# Patient Record
Sex: Female | Born: 1985
Health system: Southern US, Community
[De-identification: ages and names within clinical notes are randomized; demographics above are authoritative.]

## PROBLEM LIST (undated history)

## (undated) DIAGNOSIS — B20 Human immunodeficiency virus [HIV] disease: Secondary | ICD-10-CM

## (undated) DIAGNOSIS — E739 Lactose intolerance, unspecified: Secondary | ICD-10-CM

## (undated) DIAGNOSIS — Z21 Asymptomatic human immunodeficiency virus [HIV] infection status: Secondary | ICD-10-CM

## (undated) DIAGNOSIS — R7989 Other specified abnormal findings of blood chemistry: Secondary | ICD-10-CM

## (undated) DIAGNOSIS — Z91018 Allergy to other foods: Secondary | ICD-10-CM

## (undated) DIAGNOSIS — F419 Anxiety disorder, unspecified: Secondary | ICD-10-CM

## (undated) DIAGNOSIS — R011 Cardiac murmur, unspecified: Secondary | ICD-10-CM

## (undated) DIAGNOSIS — R0602 Shortness of breath: Secondary | ICD-10-CM

## (undated) HISTORY — DX: Lactose intolerance, unspecified: E73.9

## (undated) HISTORY — DX: Human immunodeficiency virus (HIV) disease: B20

## (undated) HISTORY — DX: Shortness of breath: R06.02

## (undated) HISTORY — DX: Allergy to other foods: Z91.018

## (undated) HISTORY — DX: Asymptomatic human immunodeficiency virus (hiv) infection status: Z21

## (undated) HISTORY — PX: SKIN GRAFT: SHX250

---
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
## 2003-04-11 ENCOUNTER — Emergency Department (HOSPITAL_COMMUNITY): Admission: EM | Admit: 2003-04-11 | Discharge: 2003-04-11 | Payer: Self-pay | Admitting: *Deleted

## 2003-04-11 ENCOUNTER — Encounter: Payer: Self-pay | Admitting: *Deleted

## 2004-04-22 ENCOUNTER — Emergency Department (HOSPITAL_COMMUNITY): Admission: EM | Admit: 2004-04-22 | Discharge: 2004-04-22 | Payer: Self-pay | Admitting: Emergency Medicine

## 2005-06-18 ENCOUNTER — Emergency Department: Payer: Self-pay | Admitting: Emergency Medicine

## 2006-05-08 ENCOUNTER — Emergency Department (HOSPITAL_COMMUNITY): Admission: EM | Admit: 2006-05-08 | Discharge: 2006-05-08 | Payer: Self-pay | Admitting: Emergency Medicine

## 2006-10-06 ENCOUNTER — Emergency Department: Payer: Self-pay | Admitting: Emergency Medicine

## 2007-10-24 ENCOUNTER — Emergency Department: Payer: Self-pay | Admitting: Emergency Medicine

## 2008-07-08 ENCOUNTER — Emergency Department: Payer: Self-pay | Admitting: Emergency Medicine

## 2008-09-21 ENCOUNTER — Emergency Department: Payer: Self-pay | Admitting: Emergency Medicine

## 2009-05-12 ENCOUNTER — Emergency Department: Payer: Self-pay | Admitting: Unknown Physician Specialty

## 2009-07-20 ENCOUNTER — Encounter: Payer: Self-pay | Admitting: Internal Medicine

## 2009-07-21 ENCOUNTER — Encounter: Payer: Self-pay | Admitting: Infectious Disease

## 2009-07-21 ENCOUNTER — Ambulatory Visit: Payer: Self-pay | Admitting: Internal Medicine

## 2009-07-21 DIAGNOSIS — B20 Human immunodeficiency virus [HIV] disease: Secondary | ICD-10-CM | POA: Insufficient documentation

## 2009-07-21 LAB — CONVERTED CEMR LAB
ALT: 14 units/L (ref 0–53)
Albumin: 4.7 g/dL (ref 3.5–5.2)
Basophils Absolute: 0 10*3/uL (ref 0.0–0.1)
Basophils Relative: 1 % (ref 0–1)
CO2: 26 meq/L (ref 19–32)
Chlamydia, Swab/Urine, PCR: NEGATIVE
Chloride: 103 meq/L (ref 96–112)
Cholesterol: 131 mg/dL (ref 0–200)
HDL: 48 mg/dL (ref >39)
HIV-1 antibody: POSITIVE — AB
HIV: REACTIVE
Hemoglobin, Urine: NEGATIVE
Hep B S Ab: POSITIVE — AB
MCV: 88.2 fL (ref >=78.0)
Neutro Abs: 2.8 10*3/uL (ref 1.7–7.7)
Neutrophils Relative %: 52 % (ref 43–77)
Platelets: 194 10*3/uL (ref 150–400)
Potassium: 4.4 meq/L (ref 3.5–5.3)
Protein, ur: NEGATIVE mg/dL
RDW: 13 % (ref 11.5–15.5)
Sodium: 140 meq/L (ref 135–145)
Total Bilirubin: 0.3 mg/dL (ref 0.3–1.2)
Total CHOL/HDL Ratio: 2.7
Total Protein: 7.8 g/dL (ref 6.0–8.3)
Triglycerides: 74 mg/dL (ref <150)
Urobilinogen, UA: 0.2 (ref 0.0–1.0)
pH: 7.5 (ref 5.0–8.0)

## 2009-08-05 ENCOUNTER — Ambulatory Visit: Payer: Self-pay | Admitting: Internal Medicine

## 2009-09-20 ENCOUNTER — Ambulatory Visit: Payer: Self-pay | Admitting: Internal Medicine

## 2009-09-20 LAB — CONVERTED CEMR LAB
AST: 22 units/L (ref 0–37)
Basophils Absolute: 0 10*3/uL (ref 0.0–0.1)
Creatinine, Ser: 0.99 mg/dL (ref 0.40–1.50)
Eosinophils Relative: 2 % (ref 0–5)
Glucose, Bld: 66 mg/dL — ABNORMAL LOW (ref 70–99)
HIV 1 RNA Quant: 128000 copies/mL — ABNORMAL HIGH (ref <48)
Lymphocytes Relative: 36 % (ref 12–46)
Lymphs Abs: 1.9 10*3/uL (ref 0.7–4.0)
MCHC: 33.5 g/dL (ref 30.0–36.0)
Monocytes Absolute: 0.6 10*3/uL (ref 0.1–1.0)
Neutrophils Relative %: 50 % (ref 43–77)
Platelets: 183 10*3/uL (ref 150–400)
RBC: 4.75 M/uL (ref 4.22–5.81)
RDW: 13.2 % (ref 11.5–15.5)
Total Bilirubin: 0.5 mg/dL (ref 0.3–1.2)
Total Protein: 7.4 g/dL (ref 6.0–8.3)

## 2009-10-14 ENCOUNTER — Ambulatory Visit: Payer: Self-pay | Admitting: Internal Medicine

## 2009-11-28 ENCOUNTER — Ambulatory Visit: Payer: Self-pay | Admitting: Internal Medicine

## 2009-11-28 LAB — CONVERTED CEMR LAB
Albumin: 4.5 g/dL (ref 3.5–5.2)
BUN: 10 mg/dL (ref 6–23)
Basophils Absolute: 0 10*3/uL (ref 0.0–0.1)
CO2: 28 meq/L (ref 19–32)
Chloride: 105 meq/L (ref 96–112)
Creatinine, Ser: 0.97 mg/dL (ref 0.40–1.50)
Lymphocytes Relative: 33 % (ref 12–46)
MCV: 90.4 fL (ref >=78.0)
Monocytes Absolute: 0.5 10*3/uL (ref 0.1–1.0)
Neutro Abs: 4.2 10*3/uL (ref 1.7–7.7)
Neutrophils Relative %: 58 % (ref 43–77)
Platelets: 202 10*3/uL (ref 150–400)
Potassium: 3.7 meq/L (ref 3.5–5.3)
RDW: 14.5 % (ref 11.5–15.5)

## 2009-11-30 ENCOUNTER — Encounter: Payer: Self-pay | Admitting: Internal Medicine

## 2010-01-02 ENCOUNTER — Ambulatory Visit: Payer: Self-pay | Admitting: Internal Medicine

## 2010-01-09 ENCOUNTER — Telehealth (INDEPENDENT_AMBULATORY_CARE_PROVIDER_SITE_OTHER): Payer: Self-pay | Admitting: *Deleted

## 2010-02-03 ENCOUNTER — Encounter (INDEPENDENT_AMBULATORY_CARE_PROVIDER_SITE_OTHER): Payer: Self-pay | Admitting: *Deleted

## 2010-03-21 ENCOUNTER — Ambulatory Visit: Payer: Self-pay | Admitting: Internal Medicine

## 2010-03-21 LAB — CONVERTED CEMR LAB
ALT: 22 units/L (ref 0–53)
Alkaline Phosphatase: 68 units/L (ref 39–117)
Basophils Absolute: 0 10*3/uL (ref 0.0–0.1)
Basophils Relative: 1 % (ref 0–1)
Glucose, Bld: 97 mg/dL (ref 70–99)
HIV-1 RNA Quant, Log: <1.68 (ref <1.68)
MCHC: 33.7 g/dL (ref 30.0–36.0)
Neutro Abs: 3.1 10*3/uL (ref 1.7–7.7)
Neutrophils Relative %: 53 % (ref 43–77)
Platelets: 201 10*3/uL (ref 150–400)
RDW: 12.8 % (ref 11.5–15.5)
Sodium: 139 meq/L (ref 135–145)
Total Bilirubin: 0.2 mg/dL — ABNORMAL LOW (ref 0.3–1.2)
Total Protein: 7 g/dL (ref 6.0–8.3)

## 2010-03-29 ENCOUNTER — Ambulatory Visit: Payer: Self-pay | Admitting: Internal Medicine

## 2010-03-29 DIAGNOSIS — F411 Generalized anxiety disorder: Secondary | ICD-10-CM | POA: Insufficient documentation

## 2010-04-21 ENCOUNTER — Ambulatory Visit: Payer: Self-pay | Admitting: Internal Medicine

## 2010-05-15 ENCOUNTER — Encounter: Payer: Self-pay | Admitting: Internal Medicine

## 2010-07-07 ENCOUNTER — Encounter: Payer: Self-pay | Admitting: Internal Medicine

## 2010-07-10 ENCOUNTER — Ambulatory Visit: Payer: Self-pay | Admitting: Internal Medicine

## 2010-07-10 LAB — CONVERTED CEMR LAB
BUN: 14 mg/dL (ref 6–23)
Basophils Absolute: 0 10*3/uL (ref 0.0–0.1)
CO2: 31 meq/L (ref 19–32)
Creatinine, Ser: 1 mg/dL (ref 0.40–1.50)
Eosinophils Relative: 3 % (ref 0–5)
Glucose, Bld: 88 mg/dL (ref 70–99)
HCT: 40.8 % (ref 39.0–52.0)
HIV 1 RNA Quant: <20 copies/mL (ref <20)
HIV-1 RNA Quant, Log: <1.3 (ref <1.30)
Hemoglobin: 14.1 g/dL (ref 13.0–17.0)
Lymphocytes Relative: 40 % (ref 12–46)
Lymphs Abs: 2.6 10*3/uL (ref 0.7–4.0)
Monocytes Absolute: 0.5 10*3/uL (ref 0.1–1.0)
Monocytes Relative: 8 % (ref 3–12)
RDW: 12.5 % (ref 11.5–15.5)
Total Bilirubin: 0.2 mg/dL — ABNORMAL LOW (ref 0.3–1.2)

## 2010-08-29 ENCOUNTER — Ambulatory Visit: Payer: Self-pay | Admitting: Internal Medicine

## 2010-11-09 NOTE — Assessment & Plan Note (Signed)
Summary: F/U [MKJ]   CC:  follow-up visit, lab results, c/o stress, anxiety, and depression x 2 months, and and excessive thirst.  History of Present Illness: Pt has been feeling depressed and having anxiety attacks because he is currently living in his car and not able to sleep.  He has an application in for shelter plus housing which he is awaiting a response.  He has not missed any doses of his Atripla.  Depression History:      The patient is having a depressed mood most of the day and has a diminished interest in his usual daily activities.        The patient denies that he feels like life is not worth living, denies that he wishes that he were dead, and denies that he has thought about ending his life.        Preventive Screening-Counseling & Management  Alcohol-Tobacco     Alcohol drinks/day: occasional     Smoking Status: quit > 6 months     Year Quit: 06/2009  Caffeine-Diet-Exercise     Caffeine use/day: soda occassionally     Does Patient Exercise: yes     Type of exercise: dancing     Exercise (avg: min/session): >60     Times/week: 7  Safety-Violence-Falls     Seat Belt Use: yes      Sexual History:  n/a.        Drug Use:  No.    Comments: pt. declined condoms   Updated Prior Medication List: ATRIPLA 600-200-300 MG TABS (EFAVIRENZ-EMTRICITAB-TENOFOVIR) Take 1 tablet by mouth at bedtime  Current Allergies (reviewed today): No known allergies  Social History: Sexual History:  n/a  Review of Systems  The patient denies anorexia, fever, and weight loss.    Vital Signs:  Patient profile:   25 year old female Height:      75 inches (190.50 cm) Weight:      153.8 pounds (69.91 kg) BMI:     19.29 Temp:     98.3 degrees F (36.83 degrees C) oral Pulse rate:   82 / minute BP sitting:   111 / 74  (left arm)  Vitals Entered By: Wendall Mola CMA Duncan Dull) (March 29, 2010 2:50 PM) CC: follow-up visit, lab results, c/o stress, anxiety, and depression x  2 months, and excessive thirst Is Patient Diabetic? No Pain Assessment Patient in pain? no      Nutritional Status BMI of 19 -24 = normal Nutritional Status Detail appetite "off and on"  Have you ever been in a relationship where you felt threatened, hurt or afraid?No   Does patient need assistance? Functional Status Self care Ambulation Normal Comments no missed doses of meds per patient patient is presently homeless   Physical Exam  General:  alert, well-developed, well-nourished, and well-hydrated.   Head:  normocephalic and atraumatic.   Mouth:  pharynx pink and moist.   Lungs:  normal breath sounds.      Impression & Recommendations:  Problem # 1:  HIV INFECTION (ICD-042) Pt.s most recent CD4ct was 680 and VL  <48.  Pt instructed to continue the current antiretroviral regimen.  Pt encouraged to take medication regularly and not miss doeses.  Pt will f/u in 3 months for repeat blood work and will see me 2 weeks later.  Diagnostics Reviewed:  HIV: HIV positive - not AIDS (01/02/2010)   HIV-Western blot: Positive (07/21/2009)   CD4: 680 (03/22/2010)   WBC: 5.8 (03/21/2010)   Hgb:  14.3 (03/21/2010)   HCT: 42.4 (03/21/2010)   Platelets: 201 (03/21/2010) HIV genotype: * (07/21/2009)   HIV-1 RNA: <48 copies/mL (03/21/2010)   HBSAg: NEG (07/21/2009)  Orders: Est. Patient Level III (99213)Future Orders: T-CD4SP ([facility]) (CD4SP) ... 06/27/2010 T-HIV Viral Load [id number]) ... 06/27/2010 T-Comprehensive Metabolic Panel [id number]) ... 06/27/2010 T-CBC w/Diff ([id number]) ... 06/27/2010  Problem # 2:  ANXIETY STATE, UNSPECIFIED (ICD-300.00) refer to counseling.  Patient Instructions: 1)  Please schedule a follow-up appointment in 3 months, 2 weeks after labs.

## 2010-11-09 NOTE — Miscellaneous (Signed)
Summary: [facility]; Medical Case Mgt.   [facility]; Medical Case Mgt[facility]   Imported By: Florinda Marker 11/02/2009 14:25:44  _____________________________________________________________________  External Attachment:    Type:   Image     Comment:   External Document

## 2010-11-09 NOTE — Assessment & Plan Note (Signed)
Summary: FLU SHOT  Prior Medications: ATRIPLA 600-200-300 MG TABS (EFAVIRENZ-EMTRICITAB-TENOFOVIR) Take 1 tablet by mouth at bedtime ALPRAZOLAM 1 MG TABS (ALPRAZOLAM) Take 1 tablet by mouth every 8 hours as needed Current Allergies: No known allergies  Immunizations Administered:  Influenza Vaccine # 1:    Vaccine Type: Fluvax Non-MCR    Site: right deltoid    Mfr: novartis    Dose: 0.5 ml    Route: IM    Given by: Deirdre Evener RN    Exp. Date: 01/21/2011    Lot #: [id number]    VIS given: 05/02/10 version given July 10, 2010.  Flu Vaccine Consent Questions:    Do you have a history of severe allergic reactions to this vaccine? no    Any prior history of allergic reactions to egg and/or gelatin? no    Do you have a sensitivity to the preservative Thimersol? no    Do you have a past history of Guillan-Barre Syndrome? no    Do you currently have an acute febrile illness? no    Have you ever had a severe reaction to latex? no    Vaccine information given and explained to patient? yes  Orders Added: 1)  Influenza Vaccine NON MCR [00028]

## 2010-11-09 NOTE — Letter (Signed)
Summary: Income Veritication  Income Veritication   Imported By: Florinda Marker 12/08/2009 15:40:19  _____________________________________________________________________  External Attachment:    Type:   Image     Comment:   External Document

## 2010-11-09 NOTE — Assessment & Plan Note (Signed)
Summary: MEDS PROBLEM [MKJ]   CC:  pt. c/o anxiety for three months with "panic attacks" and Depression.  History of Present Illness: Pt has been experiencing a lot of anxiety and panic attacks. Pt feels anxious when in stores or in cars when someone else is driving. pt is in counseling with Alisa.  Pt would like something to take for anxiety.  Depression History:      The patient denies a depressed mood most of the day and a diminished interest in his usual daily activities.        The patient denies that he feels like life is not worth living, denies that he wishes that he were dead, and denies that he has thought about ending his life.        Preventive Screening-Counseling & Management  Alcohol-Tobacco     Alcohol drinks/day: occasional     Smoking Status: quit > 6 months     Year Quit: 06/2009  Caffeine-Diet-Exercise     Caffeine use/day: soda occassionally     Does Patient Exercise: yes     Type of exercise: dancing     Exercise (avg: min/session): >60     Times/week: 7  Safety-Violence-Falls     Seat Belt Use: yes      Sexual History:  n/a.        Drug Use:  No.     Updated Prior Medication List: ATRIPLA 600-200-300 MG TABS (EFAVIRENZ-EMTRICITAB-TENOFOVIR) Take 1 tablet by mouth at bedtime ALPRAZOLAM 1 MG TABS (ALPRAZOLAM) Take 1 tablet by mouth every 8 hours as needed  Current Allergies (reviewed today): No known allergies  Review of Systems  The patient denies anorexia, fever, weight loss, and chest pain.    Vital Signs:  Patient profile:   25 year old female Height:      75 inches (190.50 cm) Weight:      154.0 pounds (70 kg) BMI:     19.32 Temp:     97.9 degrees F (36.61 degrees C) oral Pulse rate:   59 / minute BP sitting:   116 / 74  (right arm)  Vitals Entered By: Wendall Mola CMA Duncan Dull) (April 21, 2010 9:09 AM) CC: pt. c/o anxiety for three months with "panic attacks", Depression Is Patient Diabetic? No Pain Assessment Patient in pain? no       Nutritional Status BMI of 19 -24 = normal Nutritional Status Detail appetite "so-so"  Does patient need assistance? Functional Status Self care Ambulation Normal Comments no missed doses of meds per patient   Physical Exam  General:  alert, well-developed, well-nourished, and well-hydrated.   Head:  normocephalic and atraumatic.   Mouth:  pharynx pink and moist.   Heart:  normal rate.     Impression & Recommendations:  Problem # 1:  ANXIETY STATE, UNSPECIFIED (ICD-300.00) continue counseling/ deep breathing techniques trial of as needed xanax His updated medication list for this problem includes:    Alprazolam 1 Mg Tabs (Alprazolam) .Marland Kitchen... Take 1 tablet by mouth every 8 hours as needed  Orders: Est. Patient Level III (40981)  Medications Added to Medication List This Visit: 1)  Alprazolam 1 Mg Tabs (Alprazolam) .... Take 1 tablet by mouth every 8 hours as needed Prescriptions: ALPRAZOLAM 1 MG TABS (ALPRAZOLAM) Take 1 tablet by mouth every 8 hours as needed  #60 x 0   Entered and Authorized by:   Yisroel Ramming MD   Signed by:   Yisroel Ramming MD on 04/21/2010   Method used:  Print then Give to Patient   RxID:   [id number]

## 2010-11-09 NOTE — Progress Notes (Signed)
Summary: Need additonal info for ADAP    Call placed by: Paulo Fruit  BS,CPht II,MPH,  January 09, 2010 2:59 PM Call placed to: Patient Reason for Call: Get patient information Summary of Call: Need additonal information for ADAP approval. Tried to contact patient,but unable to leave a message because the voicemailbox has not been set up as of yet.  Will try again so she will not have any interruptions in her medications. Initial call taken by: Paulo Fruit  BS,CPht II,MPH,  January 09, 2010 3:00 PM  Follow-up for Phone Call        Judeth Cornfield from [facility] [place] office called to let me know that she gave [name] the new phone number to clinic and that Byrd Hesselbach needed to speak with her.  Patient also called and left 2 phone numbers that she may be reached at  [phone] or [phone]    Additional Follow-up for Phone Call Additional follow up Details #2::    I called and had to leave another message for patient on her VM. The number I was able to leave a message on was [phone].  Awaiting patient to return call. Follow-up by: Paulo Fruit  BS,CPht II,MPH,  January 12, 2010 2:12 PM  Additional Follow-up for Phone Call Additional follow up Details #3:: Details for Additional Follow-up Action Taken: Patient came today with the notarized letter of support.  I will take care of resubmitting her application Friday so she can continue receiving medications. Additional Follow-up by: Paulo Fruit  BS,CPht II,MPH,  January 12, 2010 4:56 PM   Appended Document: Need additonal info for ADAP patient came in today because she is out-of-medication. A sample was given of Atripla Sample Given, Lot #: [id number] Expiration Date:05 2013 Patient has been instructed regarding the correct time, dose and frequency of taking this med, including desired effects and most common side effects.

## 2010-11-09 NOTE — Assessment & Plan Note (Signed)
Summary: F/U OV/VS   CC:  follow-up visit, lab results, refill Alprazolam, c/o problems with wisdom teeth, and and blurred vision right eye.  History of Present Illness: patient is here for follow-up on his lab results.  He continues to have good and bad days but he feels like the Xanax is really helping with his anxiety.  He is not missed any doses of his medication.  Preventive Screening-Counseling & Management  Alcohol-Tobacco     Alcohol drinks/day: occasional     Smoking Status: quit > 6 months     Year Quit: 06/2009  Caffeine-Diet-Exercise     Caffeine use/day: soda occassionally     Does Patient Exercise: yes     Type of exercise: dancing     Exercise (avg: min/session): >60     Times/week: 7  Safety-Violence-Falls     Seat Belt Use: yes      Sexual History:  n/a.        Drug Use:  No.    Comments: pt. given condoms   Updated Prior Medication List: ATRIPLA 600-200-300 MG TABS (EFAVIRENZ-EMTRICITAB-TENOFOVIR) Take 1 tablet by mouth at bedtime ALPRAZOLAM 1 MG TABS (ALPRAZOLAM) Take 1 tablet by mouth every 8 hours as needed  Current Allergies (reviewed today): No known allergies  Review of Systems  The patient denies anorexia, fever, and weight loss.    Vital Signs:  Patient profile:   25 year old female Height:      75 inches (190.50 cm) Weight:      151.0 pounds (68.64 kg) BMI:     18.94 Temp:     98.2 degrees F (36.78 degrees C) oral Pulse rate:   61 / minute BP sitting:   105 / 68  (left arm)  Vitals Entered By: Wendall Mola CMA Duncan Dull) (August 29, 2010 3:20 PM) CC: follow-up visit, lab results, refill Alprazolam, c/o problems with wisdom teeth, and blurred vision right eye Is Patient Diabetic? No Pain Assessment Patient in pain? no      Nutritional Status BMI of < 19 = underweight Nutritional Status Detail appetite "varies"  Have you ever been in a relationship where you felt threatened, hurt or afraid?No   Does patient need  assistance? Functional Status Self care Ambulation Normal Comments no missed doses of Atripla per pt.   Physical Exam  General:  alert, well-developed, well-nourished, and well-hydrated.   Head:  normocephalic and atraumatic.   Mouth:  pharynx pink and moist.   Lungs:  normal breath sounds.     Impression & Recommendations:  Problem # 1:  HIV INFECTION (ICD-042) Pt.s most recent CD4ct was 960 and VL <20 .  Pt instructed to continue the current antiretroviral regimen.  Pt encouraged to take medication regularly and not miss doses.  Pt will f/u in 3 months for repeat blood work and will see me 2 weeks later.  Diagnostics Reviewed:  HIV: HIV positive - not AIDS (01/02/2010)   HIV-Western blot: Positive (07/21/2009)   CD4: 960 (07/11/2010)   WBC: 6.5 (07/10/2010)   Hgb: 14.1 (07/10/2010)   HCT: 40.8 (07/10/2010)   Platelets: 224 (07/10/2010) HIV genotype: * (07/21/2009)   HIV-1 RNA: <20 copies/mL (07/10/2010)   HBSAg: NEG (07/21/2009)  Orders: Est. Patient Level III (99213)Future Orders: T-CD4SP ([facility]) (CD4SP) ... 11/27/2010 T-HIV Viral Load [id number]) ... 11/27/2010 T-Comprehensive Metabolic Panel [phone]) ... 11/27/2010 T-CBC w/Diff ([id number]) ... 11/27/2010 T-RPR (Syphilis) [id number]) ... 11/27/2010 T-Lipid Profile [id number]) ... 11/27/2010  Problem # 2:  ANXIETY STATE, UNSPECIFIED (ICD-300.00)  refill xanax His updated medication list for this problem includes:    Alprazolam 1 Mg Tabs (Alprazolam) .Marland Kitchen... Take 1 tablet by mouth every 8 hours as needed  Other Orders: TB Skin Test (16109) Admin 1st Vaccine (60454)  Patient Instructions: 1)  Please schedule a follow-up appointment in 3 months, 2 weeks after labs.  Prescriptions: ALPRAZOLAM 1 MG TABS (ALPRAZOLAM) Take 1 tablet by mouth every 8 hours as needed  #60 x 3   Entered and Authorized by:   Yisroel Ramming MD   Signed by:   Yisroel Ramming MD on 08/29/2010   Method used:   Print then Give to Patient    RxID:   [id number]      Immunizations Administered:  PPD Skin Test:    Vaccine Type: PPD    Site: left forearm    Mfr: Sanofi Pasteur    Dose: 0.1 ml    Route: ID    Given by: Wendall Mola CMA ( AAMA)    Exp. Date: 08/10/2011    Lot #: [id number]

## 2010-11-09 NOTE — Consult Note (Signed)
Summary: New Pt. Referral: [facility]  New Pt. Referral: [facility]   Imported By: Florinda Marker 05/15/2010 14:58:17  _____________________________________________________________________  External Attachment:    Type:   Image     Comment:   External Document

## 2010-11-09 NOTE — Miscellaneous (Signed)
Summary: clinical update/ryan white [facility] apprv til 01/06/11  Clinical Lists Changes  Observations: Added new observation of AIDSDAP: Yes 2011 (02/03/2010 11:56)     Paulo Fruit  BS,CPht II,MPH  February 03, 2010 11:57 AM

## 2010-11-09 NOTE — Assessment & Plan Note (Signed)
Summary: MISSED APPT 10-12-09 FOR F/U VISIT/CH   CC:  f/u labs and nausea in the morning.  History of Present Illness: Pt under a lot of stress. Having some financial issues and legal issues.  Just started her Atripla and has not missed any doses.  Labs were drawn prior to starting.  Preventive Screening-Counseling & Management  Alcohol-Tobacco     Alcohol drinks/day: 0     Smoking Status: never     Year Quit: 06/2009  Caffeine-Diet-Exercise     Caffeine use/day: soda occassionally     Does Patient Exercise: yes     Type of exercise: dancing     Exercise (avg: min/session): >60     Times/week: 7   Updated Prior Medication List: ATRIPLA 600-200-300 MG TABS (EFAVIRENZ-EMTRICITAB-TENOFOVIR) Take 1 tablet by mouth at bedtime  Current Allergies (reviewed today): No known allergies  Review of Systems  The patient denies anorexia, fever, and weight loss.    Vital Signs:  Patient profile:   25 year old female Height:      75 inches (190.50 cm) Weight:      150.31 pounds (68.32 kg) BMI:     18.86 Temp:     97.0 degrees F (36.11 degrees C) oral Pulse rate:   61 / minute BP sitting:   122 / 84  (left arm)  Vitals Entered By: Starleen Arms CMA (October 14, 2009 4:10 PM) CC: f/u labs, nausea in the morning Pain Assessment Patient in pain? no      Nutritional Status BMI of < 19 = underweight Nutritional Status Detail nl  Does patient need assistance? Functional Status Self care Ambulation Normal   Physical Exam  General:  alert, well-developed, well-nourished, and well-hydrated.   Head:  normocephalic and atraumatic.   Mouth:  pharynx pink and moist.  no thrush  Lungs:  normal breath sounds.      Impression & Recommendations:  Problem # 1:  HIV INFECTION (ICD-042) Will bring her back in 6 weeks for labs.  She will see me 2 weeks after. Pt to continue Atripla and try not to miss any doses. Diagnostics Reviewed:  HIV: REACTIVE (07/21/2009)   HIV-Western blot:  Positive (07/21/2009)   CD4: 440 (09/21/2009)   WBC: 5.1 (09/20/2009)   Hgb: 13.6 (09/20/2009)   HCT: 40.6 (09/20/2009)   Platelets: 183 (09/20/2009) HIV genotype: * (07/21/2009)   HIV-1 RNA: 128000 (09/20/2009)   HBSAg: NEG (07/21/2009)  Orders: Est. Patient Level III (99213)Future Orders: T-CD4SP ([facility]) (CD4SP) ... 11/25/2009 T-HIV Viral Load [id number]) ... 11/25/2009 T-Comprehensive Metabolic Panel [phone]) ... 11/25/2009 T-CBC w/Diff ([id number]) ... 11/25/2009  Patient Instructions: 1)  Please schedule a follow-up appointment in 8 weeks.  Process Orders Check Orders Results:     [facility]: ABN not required for this insurance Tests Sent for requisitioning (October 14, 2009 4:22 PM):     11/25/2009: [facility] -- T-HIV Viral Load [phone] (signed)     11/25/2009: [facility] -- T-Comprehensive Metabolic Panel [[id number]] (signed)     11/25/2009: [facility] -- [facility] w/Diff [[id number]] (signed)

## 2010-11-09 NOTE — Miscellaneous (Signed)
Summary: [place]   [facility]   Imported By: Florinda Marker 07/10/2010 15:48:03  _____________________________________________________________________  External Attachment:    Type:   Image     Comment:   External Document

## 2010-11-09 NOTE — Assessment & Plan Note (Signed)
Summary: EST-CK/FU/MEDS/CFB   CC:  follow-up visit and lab results.  History of Present Illness: Pt tolerating the Atripla well.  No missed doses.  Preventive Screening-Counseling & Management  Alcohol-Tobacco     Alcohol drinks/day: 0     Smoking Status: quit > 6 months     Year Quit: 06/2009  Caffeine-Diet-Exercise     Caffeine use/day: soda occassionally     Does Patient Exercise: yes     Type of exercise: dancing     Exercise (avg: min/session): >60     Times/week: 7  Comments: pt given condoms   Updated Prior Medication List: ATRIPLA 600-200-300 MG TABS (EFAVIRENZ-EMTRICITAB-TENOFOVIR) Take 1 tablet by mouth at bedtime  Current Allergies (reviewed today): No known allergies  Review of Systems  The patient denies anorexia, fever, and weight loss.    Vital Signs:  Patient profile:   25 year old female Height:      75 inches (190.50 cm) Weight:      158.7 pounds (72.14 kg) BMI:     19.91 Temp:     97.7 degrees F (36.50 degrees C) oral Pulse rate:   64 / minute BP sitting:   113 / 67  (left arm)  Vitals Entered By: Wendall Mola CMA Duncan Dull) (January 02, 2010 4:05 PM) CC: follow-up visit, lab results Is Patient Diabetic? No Pain Assessment Patient in pain? no      Nutritional Status BMI of 19 -24 = normal Nutritional Status Detail appetite "so-so"  Does patient need assistance? Functional Status Self care Ambulation Normal Comments no missed doses of meds per patient   Physical Exam  General:  alert, well-developed, well-nourished, and well-hydrated.   Head:  normocephalic and atraumatic.   Mouth:  pharynx pink and moist.  no thrush  Lungs:  normal breath sounds.      Impression & Recommendations:  Problem # 1:  HIV INFECTION (ICD-042) Pt.s most recent CD4ct was 720 and VL 249 .  Pt instructed to continue the current antiretroviral regimen.  Pt encouraged to take medication regularly and not miss doses.  Pt will f/u in 3 months for repeat  blood work and will see me 2 weeks later.  Diagnostics Reviewed:  HIV: REACTIVE (07/21/2009)   HIV-Western blot: Positive (07/21/2009)   CD4: 720 (11/29/2009)   WBC: 7.2 (11/28/2009)   Hgb: 13.7 (11/28/2009)   HCT: 41.3 (11/28/2009)   Platelets: 202 (11/28/2009) HIV genotype: * (07/21/2009)   HIV-1 RNA: 249 (11/28/2009)   HBSAg: NEG (07/21/2009)  Orders: Est. Patient Level III (99213)Future Orders: T-CD4SP ([facility]) (CD4SP) ... 04/02/2010 T-HIV Viral Load [id number]) ... 04/02/2010 T-Comprehensive Metabolic Panel [phone] [id number]) ... 04/02/2010 T-CBC w/Diff ([id number]) ... 04/02/2010  Patient Instructions: 1)  Please schedule a follow-up appointment in 3 months, 2 weeks after labs.  Process Orders Check Orders Results:     [facility]: ABN not required for this insurance Tests Sent for requisitioning (January 05, 2010 11:03 AM):     04/02/2010: [facility] -- T-HIV Viral Load [phone] (signed)     04/02/2010: [facility] -- T-Comprehensive Metabolic Panel [[id number]] (signed)     04/02/2010: [facility] -- [facility] w/Diff [[id number]] (signed)

## 2010-11-21 ENCOUNTER — Other Ambulatory Visit: Payer: Self-pay | Admitting: Adult Health

## 2010-11-21 ENCOUNTER — Encounter: Payer: Self-pay | Admitting: Adult Health

## 2010-11-21 ENCOUNTER — Other Ambulatory Visit (INDEPENDENT_AMBULATORY_CARE_PROVIDER_SITE_OTHER): Payer: Self-pay

## 2010-11-21 DIAGNOSIS — B20 Human immunodeficiency virus [HIV] disease: Secondary | ICD-10-CM

## 2010-11-21 LAB — CONVERTED CEMR LAB
ALT: 17 units/L (ref 0–53)
Alkaline Phosphatase: 63 units/L (ref 39–117)
Basophils Relative: 1 % (ref 0–1)
Cholesterol: 124 mg/dL (ref 0–200)
Eosinophils Absolute: 0.1 10*3/uL (ref 0.0–0.7)
Eosinophils Relative: 2 % (ref 0–5)
HCT: 43.1 % (ref 39.0–52.0)
HIV 1 RNA Quant: 57 copies/mL — ABNORMAL HIGH (ref <20)
HIV-1 RNA Quant, Log: 1.76 — ABNORMAL HIGH (ref <1.30)
LDL Cholesterol: 69 mg/dL (ref 0–99)
MCHC: 32.9 g/dL (ref 30.0–36.0)
MCV: 94.7 fL (ref 78.0–100.0)
Monocytes Relative: 7 % (ref 3–12)
Neutrophils Relative %: 57 % (ref 43–77)
Platelets: 231 10*3/uL (ref 150–400)
Sodium: 141 meq/L (ref 135–145)
Total Bilirubin: 0.3 mg/dL (ref 0.3–1.2)
Total Protein: 7.1 g/dL (ref 6.0–8.3)
Triglycerides: 40 mg/dL (ref <150)

## 2010-11-22 LAB — T-HELPER CELL (CD4) - (RCID CLINIC ONLY)
CD4 % Helper T Cell: 40 % (ref 33–55)
CD4 T Cell Abs: 730 uL (ref 400–2700)

## 2010-11-27 ENCOUNTER — Encounter (INDEPENDENT_AMBULATORY_CARE_PROVIDER_SITE_OTHER): Payer: Self-pay | Admitting: *Deleted

## 2010-12-05 ENCOUNTER — Ambulatory Visit (INDEPENDENT_AMBULATORY_CARE_PROVIDER_SITE_OTHER): Payer: Self-pay | Admitting: Adult Health

## 2010-12-05 ENCOUNTER — Encounter: Payer: Self-pay | Admitting: Adult Health

## 2010-12-05 DIAGNOSIS — B20 Human immunodeficiency virus [HIV] disease: Secondary | ICD-10-CM

## 2010-12-05 DIAGNOSIS — F640 Transsexualism: Secondary | ICD-10-CM

## 2010-12-11 ENCOUNTER — Encounter (INDEPENDENT_AMBULATORY_CARE_PROVIDER_SITE_OTHER): Payer: Self-pay | Admitting: *Deleted

## 2010-12-14 ENCOUNTER — Encounter (INDEPENDENT_AMBULATORY_CARE_PROVIDER_SITE_OTHER): Payer: Self-pay | Admitting: *Deleted

## 2010-12-14 NOTE — Assessment & Plan Note (Signed)
Summary: 84month f/u new to np   Vital Signs:  Patient profile:   25 year old female Height:      75 inches Weight:      153.4 pounds BMI:     19.24 Temp:     98.2 degrees F oral Pulse rate:   67 / minute BP sitting:   116 / 79  (left arm)  Vitals Entered By: Alesia Morin CMA (December 05, 2010 10:39 AM) CC: follow-up visit for labs Is Patient Diabetic? No Pain Assessment Patient in pain? no      Nutritional Status BMI of 19 -24 = normal Nutritional Status Detail appetite "good"  Have you ever been in a relationship where you felt threatened, hurt or afraid?No   Does patient need assistance? Functional Status Self care Ambulation Normal Comments no missed doses   CC:  follow-up visit for labs.  History of Present Illness: Doing well. Recently recovered from URI.  No physical complaints at present.  Would like to start estrogen hormone therapy.  Preventive Screening-Counseling & Management  Alcohol-Tobacco     Alcohol drinks/day: occasional     Smoking Status: quit > 6 months     Year Quit: 06/2009  Caffeine-Diet-Exercise     Caffeine use/day: soda occassionally     Does Patient Exercise: yes     Type of exercise: dancing     Exercise (avg: min/session): >60     Times/week: 7  Safety-Violence-Falls     Seat Belt Use: yes      Sexual History:  n/a.        Drug Use:  No.        Blood Transfusions:  no.        Travel History:  no.    Comments: pt accepted condoms  Allergies (verified): No Known Drug Allergies  Social History: Blood Transfusions:  no Travel History:  no  Review of Systems  The patient denies anorexia, fever, weight loss, weight gain, vision loss, decreased hearing, hoarseness, chest pain, syncope, dyspnea on exertion, peripheral edema, prolonged cough, headaches, hemoptysis, abdominal pain, melena, hematochezia, severe indigestion/heartburn, hematuria, incontinence, genital sores, muscle weakness, suspicious skin lesions, transient  blindness, difficulty walking, depression, unusual weight change, abnormal bleeding, enlarged lymph nodes, angioedema, breast masses, and testicular masses.    Physical Exam  General:  alert, well-developed, well-nourished, and well-hydrated.   Head:  normocephalic and atraumatic.   Eyes:  vision grossly intact, pupils equal, pupils round, and pupils reactive to light.   Ears:  R ear normal and L ear normal.   Nose:  no external deformity and no nasal discharge.   Mouth:  pharynx pink and moist.   Neck:  supple, full ROM, and no masses.   Lungs:  normal respiratory effort and normal breath sounds.   Heart:  normal rate, regular rhythm, no murmur, no gallop, and no rub.   Abdomen:  soft, non-tender, normal bowel sounds, no hepatomegaly, and no splenomegaly.   Msk:  normal ROM, no joint tenderness, and no joint swelling.   Extremities:  No clubbing, cyanosis, edema, or deformity noted with normal full range of motion of all joints.   Neurologic:  alert & oriented X3, cranial nerves II-XII intact, strength normal in all extremities, and gait normal.   Skin:  Intact without suspicious lesions or rashes Cervical Nodes:  No lymphadenopathy noted Axillary Nodes:  No palpable lymphadenopathy Inguinal Nodes:  No significant adenopathy Psych:  Cognition and judgment appear intact. Alert and cooperative with normal attention  span and concentration. No apparent delusions, illusions, hallucinations   Impression & Recommendations:  Problem # 1:  HIV INFECTION (ICD-042)  CD4 730 @ 40% with VL of 57 copies.  Small elevatiion in VL may be due to recent URI.  Will re-evaluate in 10 weeks with f/u in 3 months.  CPM.  Orders: Est. Patient Level IV (99214)Future Orders: T-CBC w/Diff ([id number]) ... 02/13/2011 T-CD4SP ([facility]) (CD4SP) ... 02/13/2011 T-Comprehensive Metabolic Panel [id number]) ... 02/13/2011 T-HIV Viral Load [id number]) ... 02/13/2011  Problem # 2:  GENDER IDENTITY DISORDER -  GENTICALLY FEMALE (ICD-302.85)  Requestiong estrogen replacement therapy.  No hx of smoking, breast or prostate cancer, or dyslipidemias.  Will start with low-dose premarin and titrate upward to satifactory response.  Rx of Premarin 0.3mg  by mouth once daily written.  Drug effects, SE's, ADR's and toxicities reviewed with verbalized understanding of instructions.  Will follow-up on next visit or she is to call is she finds dosing inadequate.  Most likely based on size alone, we will need to titrate to a higher dose.  Verbally acknowledged and agreed with plan.  Orders: Est. Patient Level IV (44034)  Medications Added to Medication List This Visit: 1)  Premarin 0.3 Mg Tabs (Estrogens conjugated) .Marland Kitchen.. 1 tab by mouth once daily as directed  Other Orders: Future Orders: T-Lipid Profile ([id number]) ... 02/13/2011 Prescriptions: PREMARIN 0.3 MG TABS (ESTROGENS CONJUGATED) 1 tab by mouth once daily as directed  #30 x 2   Entered and Authorized by:   Talmadge Chad NP   Signed by:   Talmadge Chad NP on 12/05/2010   Method used:   Print then Give to Patient   RxID:   [id number]    Orders Added: 1)  T-CBC w/Diff [[id number]] 2)  T-CD4SP [facility]) [CD4SP] 3)  T-Comprehensive Metabolic Panel [[id number]] 4)  T-HIV Viral Load [[id number]] 5)  T-Lipid Profile [[id number]] 6)  Est. Patient Level IV [28315]   PPD Results    Date of reading: 08/31/2010    Results: < 5mm    Interpretation: negative

## 2010-12-19 NOTE — Miscellaneous (Signed)
Summary: RW/ADAP UPDATE  Clinical Lists Changes  Observations: Added new observation of AIDSDAP: Pending-APPROVAL 2012 (12/11/2010 12:16) Added new observation of FINASSESSDT: 12/11/2010 (12/11/2010 12:16)

## 2010-12-19 NOTE — Miscellaneous (Signed)
Summary: PRESCRIPTION ASSISTANCE APPLICATION  Clinical Lists Changes  MR. COURTS NEEDED ASSISTANCE WITH A PAP FOR PREMARIN.  HE CAME INTO THE OFFICE TO MEET FOR A SCHEDULED APPOINTMENT TO COMPLETE THE APPLICATION FOR [facility] - CONNECTION TO CARE.  THE APPLICATION WAS COMPLETED ON 12/13/10 AND RETURNED TO BE MAILED AND WAS MAILED ON 12/14/10.

## 2010-12-21 ENCOUNTER — Encounter (INDEPENDENT_AMBULATORY_CARE_PROVIDER_SITE_OTHER): Payer: Self-pay | Admitting: *Deleted

## 2010-12-21 LAB — T-HELPER CELL (CD4) - (RCID CLINIC ONLY): CD4 T Cell Abs: 960 uL (ref 400–2700)

## 2010-12-25 ENCOUNTER — Encounter (INDEPENDENT_AMBULATORY_CARE_PROVIDER_SITE_OTHER): Payer: Self-pay | Admitting: *Deleted

## 2010-12-25 LAB — T-HELPER CELL (CD4) - (RCID CLINIC ONLY): CD4 % Helper T Cell: 36 % (ref 33–55)

## 2010-12-26 NOTE — Miscellaneous (Signed)
Summary: ADAP APPROVED TIL 07/08/11  Clinical Lists Changes  Observations: Added new observation of AIDSDAP: Yes 2012 (12/21/2010 18:01)

## 2011-01-04 NOTE — Miscellaneous (Signed)
Summary: [facility] - CONNECTION TO CARE  Clinical Lists Changes  CALLED [facility] - CONNECTION TO  CARE - CHECKING STATUS OF APP. - ENROLLED ON 12/25/2010 THROUGH 12/25/2011.  MEDICATION ORDER WAS PLACES AND WILL SHIP OUT IN 2 - 3 DAYS TO [facility], 7 - 10 DAYS FOR DELIVERY.  RE-ORDER CAN BE PLACED NO EARLIER THAN MAY 16TH.

## 2011-01-05 ENCOUNTER — Other Ambulatory Visit: Payer: Self-pay | Admitting: Licensed Clinical Social Worker

## 2011-01-05 DIAGNOSIS — F419 Anxiety disorder, unspecified: Secondary | ICD-10-CM

## 2011-01-05 MED ORDER — ALPRAZOLAM 1 MG PO TABS: 1.0000 mg | ORAL_TABLET | Freq: Three times a day (TID) | ORAL | Status: DC | PRN

## 2011-01-09 LAB — T-HELPER CELL (CD4) - (RCID CLINIC ONLY): CD4 % Helper T Cell: 25 % — ABNORMAL LOW (ref 33–55)

## 2011-01-10 ENCOUNTER — Other Ambulatory Visit: Payer: Self-pay | Admitting: *Deleted

## 2011-01-17 ENCOUNTER — Telehealth: Payer: Self-pay | Admitting: Adult Health

## 2011-01-17 NOTE — Telephone Encounter (Signed)
Mr. Renee Malone has been approved with [facility] [facility] throu 12/25/2011 for Premarin.  ID # [id number]

## 2011-03-14 ENCOUNTER — Other Ambulatory Visit (INDEPENDENT_AMBULATORY_CARE_PROVIDER_SITE_OTHER): Payer: Self-pay

## 2011-03-14 DIAGNOSIS — B20 Human immunodeficiency virus [HIV] disease: Secondary | ICD-10-CM

## 2011-03-14 LAB — LIPID PANEL
HDL: 43 mg/dL (ref >39)
LDL Cholesterol: 76 mg/dL (ref 0–99)
Total CHOL/HDL Ratio: 2.9 Ratio

## 2011-03-14 LAB — CBC WITH DIFFERENTIAL/PLATELET
Basophils Relative: 1 % (ref 0–1)
Eosinophils Absolute: 0.1 10*3/uL (ref 0.0–0.7)
Eosinophils Relative: 2 % (ref 0–5)
HCT: 40.2 % (ref 39.0–52.0)
Hemoglobin: 13.7 g/dL (ref 13.0–17.0)
MCH: 30.9 pg (ref 26.0–34.0)
MCHC: 34.1 g/dL (ref 30.0–36.0)
Monocytes Absolute: 0.3 10*3/uL (ref 0.1–1.0)
Monocytes Relative: 6 % (ref 3–12)

## 2011-03-15 LAB — BASIC METABOLIC PANEL WITH GFR
BUN: 10 mg/dL (ref 6–23)
Chloride: 104 mEq/L (ref 96–112)
Glucose, Bld: 88 mg/dL (ref 70–99)
Potassium: 4.2 mEq/L (ref 3.5–5.3)

## 2011-03-16 LAB — HIV-1 RNA QUANT-NO REFLEX-BLD: HIV-1 RNA Quant, Log: 2.46 {Log} — ABNORMAL HIGH (ref <1.30)

## 2011-03-28 ENCOUNTER — Ambulatory Visit (INDEPENDENT_AMBULATORY_CARE_PROVIDER_SITE_OTHER): Payer: Self-pay | Admitting: Adult Health

## 2011-03-28 ENCOUNTER — Encounter: Payer: Self-pay | Admitting: Adult Health

## 2011-03-28 VITALS — BP 106/72 | HR 73 | Temp 97.9°F | Ht 75.0 in | Wt 148.0 lb

## 2011-03-28 DIAGNOSIS — B20 Human immunodeficiency virus [HIV] disease: Secondary | ICD-10-CM

## 2011-03-28 NOTE — Progress Notes (Signed)
  Subjective:    Patient ID: Renee Malone, female    DOB: Feb 24, 1986, 25 y.o.   MRN: [id number]  HPI Presented to clinic for scheduled followup. This is concerned that she may have missed some doses of her medications and not certain when she has missed them. Her concerns were addressed as a result of 2 close friends dying of HIV associated complications recently. She also requested an evaluation of her HRT as there are other medications and she thinks that she might be able to use the less expensive, including estradiol.   Review of Systems  Constitutional: Negative.   HENT: Negative.   Eyes: Negative.   Respiratory: Negative.   Cardiovascular: Negative.   Gastrointestinal: Negative.   Musculoskeletal: Negative.   Skin: Negative.   Neurological: Negative.   Hematological: Negative.   Psychiatric/Behavioral: Negative.        Objective:   Physical Exam  Constitutional: He is oriented to person, place, and time. He appears well-developed and well-nourished. No distress.  HENT:  Head: Normocephalic and atraumatic.  Eyes: Conjunctivae and EOM are normal. Pupils are equal, round, and reactive to light.  Neck: Normal range of motion. Neck supple.  Cardiovascular: Normal rate and regular rhythm.   Pulmonary/Chest: Effort normal and breath sounds normal.  Abdominal: Soft. Bowel sounds are normal.  Musculoskeletal: Normal range of motion.  Neurological: He is alert and oriented to person, place, and time. No cranial nerve deficit. He exhibits normal muscle tone. Coordination normal.  Skin: Skin is warm and dry.  Psychiatric: His behavior is normal. Judgment and thought content normal.       Affect slightly blunted.          Assessment & Plan:   1. HIV. Labs obtained 03/14/2011 show a CD4 count of 810 at 42% with a viral load of 289. Copies/mL. While she shows some clinical stability. Her viral load increased by factor 0.5 log. However, this number is still too low to obtain a  reliable genotype. We will defer any further evaluation until next blood draw in 10 weeks when we will draw. A viral load with reflux, genotype. She is then to followup in 3 months.  2. Grief/Loss. Empathized with her concerns regarding the death of 2 friends, and counseled her on the fact that these factors are not her and that we would be very vigilant about making certain. HIV is not a cause of morbidity for her.  3. Transgender. We will review the list of medications. She identified as possible replacements for her conjugated estrogens and advise her and the appropriateness for therapy.  She verbally acknowledged all this information and agreed with plan of care.

## 2011-04-03 ENCOUNTER — Telehealth: Payer: Self-pay | Admitting: *Deleted

## 2011-04-03 DIAGNOSIS — F419 Anxiety disorder, unspecified: Secondary | ICD-10-CM

## 2011-04-03 MED ORDER — ALPRAZOLAM 1 MG PO TABS: 1.0000 mg | ORAL_TABLET | Freq: Three times a day (TID) | ORAL | Status: DC | PRN

## 2011-04-03 NOTE — Telephone Encounter (Signed)
Called it in 

## 2011-04-03 NOTE — Telephone Encounter (Deleted)
Pt called asking for a refill on his xanax. States he does not take more than 2 per day. Told him he should have enough thru June 30th. He is out. I called [facility] on [phone] & they state it was last filled 03/10/11. Told him I will ask NP if ok to fill. He wants to be called when I have a response [phone]

## 2011-04-09 ENCOUNTER — Other Ambulatory Visit: Payer: Self-pay | Admitting: Adult Health

## 2011-04-09 ENCOUNTER — Other Ambulatory Visit: Payer: Self-pay | Admitting: *Deleted

## 2011-04-09 DIAGNOSIS — Z79890 Hormone replacement therapy (postmenopausal): Secondary | ICD-10-CM

## 2011-04-09 MED ORDER — ESTROGENS CONJUGATED 0.3 MG PO TABS: 0.3000 mg | ORAL_TABLET | Freq: Every day | ORAL | Status: DC

## 2011-04-09 NOTE — Telephone Encounter (Signed)
Refill for Premarin called into [facility].  Order # is [id number] and should arrive in 7 - 10 days.Cammie Mcgee

## 2011-04-10 ENCOUNTER — Encounter: Payer: Self-pay | Admitting: Adult Health

## 2011-04-10 ENCOUNTER — Ambulatory Visit (INDEPENDENT_AMBULATORY_CARE_PROVIDER_SITE_OTHER): Payer: Self-pay | Admitting: Adult Health

## 2011-04-10 VITALS — BP 112/70 | HR 68 | Temp 97.5°F | Ht 75.0 in | Wt 150.0 lb

## 2011-04-10 DIAGNOSIS — B20 Human immunodeficiency virus [HIV] disease: Secondary | ICD-10-CM

## 2011-04-10 DIAGNOSIS — Z79890 Hormone replacement therapy (postmenopausal): Secondary | ICD-10-CM

## 2011-04-10 MED ORDER — ESTROGENS CONJUGATED 0.3 MG PO TABS: 0.3000 mg | ORAL_TABLET | Freq: Every day | ORAL | Status: DC

## 2011-04-10 NOTE — Progress Notes (Signed)
Renee Malone is in today to discuss a letter. That needs to be written by [place] regarding her disabled status. She apparently is in an appeals process regarding Social Security disability. We reviewed the request from her lawyers office and the specifics that they would like outlined in the letter. We then discussed in detail, her current health status as it relates to HIV, and any comorbidities. At present, we informed her that this might not be a possibility as a result of her marked improved. Immune status, and optimal virologic control. I offered to review all her records to see what possible valid reason. We may have for her being disabled. Otherwise, we would not be able to certify disability at this time. She verbally acknowledged this information. Additionally, we also reviewed current standards of transgender care. Hormone replacement therapy. I gave her a copy of the standards of care and asked if she could investigate whether or not. There was a way that she might be able to obtain the medications listed in the regimens. We agreed to meet in 6 weeks to review this.

## 2011-04-16 ENCOUNTER — Telehealth: Payer: Self-pay | Admitting: Adult Health

## 2011-04-16 NOTE — Telephone Encounter (Signed)
Received a new prescription for Premarin which was not needed.  An order for more medication just needed to be placed and that was taken care of.

## 2011-04-18 ENCOUNTER — Other Ambulatory Visit: Payer: Self-pay | Admitting: *Deleted

## 2011-04-18 DIAGNOSIS — Z79890 Hormone replacement therapy (postmenopausal): Secondary | ICD-10-CM

## 2011-04-18 MED ORDER — ESTROGENS CONJUGATED 0.3 MG PO TABS: 0.3000 mg | ORAL_TABLET | Freq: Every day | ORAL | Status: DC

## 2011-05-03 ENCOUNTER — Other Ambulatory Visit: Payer: Self-pay | Admitting: *Deleted

## 2011-05-03 DIAGNOSIS — B20 Human immunodeficiency virus [HIV] disease: Secondary | ICD-10-CM

## 2011-05-03 MED ORDER — EFAVIRENZ-EMTRICITAB-TENOFOVIR 600-200-300 MG PO TABS: 1.0000 | ORAL_TABLET | Freq: Every day | ORAL | Status: DC

## 2011-05-14 ENCOUNTER — Other Ambulatory Visit: Payer: Self-pay | Admitting: Adult Health

## 2011-05-14 ENCOUNTER — Other Ambulatory Visit: Payer: Self-pay | Admitting: *Deleted

## 2011-05-14 DIAGNOSIS — F419 Anxiety disorder, unspecified: Secondary | ICD-10-CM

## 2011-05-14 DIAGNOSIS — Z79890 Hormone replacement therapy (postmenopausal): Secondary | ICD-10-CM

## 2011-05-14 MED ORDER — ESTROGENS CONJUGATED 0.3 MG PO TABS: 0.3000 mg | ORAL_TABLET | Freq: Every day | ORAL | Status: DC

## 2011-05-14 MED ORDER — ALPRAZOLAM 1 MG PO TABS: 1.0000 mg | ORAL_TABLET | Freq: Three times a day (TID) | ORAL | Status: DC | PRN

## 2011-05-22 ENCOUNTER — Encounter: Payer: Self-pay | Admitting: Adult Health

## 2011-05-22 ENCOUNTER — Ambulatory Visit (INDEPENDENT_AMBULATORY_CARE_PROVIDER_SITE_OTHER): Payer: Self-pay | Admitting: Adult Health

## 2011-05-22 ENCOUNTER — Ambulatory Visit: Payer: Self-pay

## 2011-05-22 VITALS — BP 112/78 | HR 64 | Temp 98.3°F | Ht 75.0 in | Wt 153.0 lb

## 2011-05-22 DIAGNOSIS — E349 Endocrine disorder, unspecified: Secondary | ICD-10-CM

## 2011-05-22 DIAGNOSIS — F640 Transsexualism: Secondary | ICD-10-CM

## 2011-05-22 MED ORDER — ESTRADIOL 2 MG PO TABS: ORAL_TABLET | ORAL | Status: DC

## 2011-05-22 MED ORDER — FINASTERIDE 5 MG PO TABS: 5.0000 mg | ORAL_TABLET | Freq: Every day | ORAL | Status: DC

## 2011-05-22 MED ORDER — SPIRONOLACTONE 50 MG PO TABS: 50.0000 mg | ORAL_TABLET | Freq: Two times a day (BID) | ORAL | Status: DC

## 2011-05-22 MED ORDER — MEDROXYPROGESTERONE ACETATE 5 MG PO TABS: ORAL_TABLET | ORAL | Status: DC

## 2011-05-22 MED ORDER — SPIRONOLACTONE 25 MG PO TABS: ORAL_TABLET | ORAL | Status: DC

## 2011-05-22 NOTE — Progress Notes (Signed)
Presented to clinic today for discussion regarding transgender hormone replacement therapy. We discussed what were the treatment options, and concluded that most regimens included estradiol, finasteride, spironolactone, and optionally progestin were medroxyprogesterone. Further discussion revealed how best that we can economically order these medications without it being too much of a financial burden. After reviewing a variety a generic medication programs, we were able to ascertain that obtaining the medications from [facility]. Might be the best advantage for her. A review of the to the medications, how they act, probably are to be taken, possible side effects, possible adverse drug reactions, and potential for toxicities, was performed. We outlined medication dosing and plan as follows:  Estradiol 6 mg (2 mg tablets x3.) Sublingually daily. Finasteride 5 mg by mouth daily. Spironolactone 50 mg by mouth twice a day. Medroxyprogesterone 5 mg by mouth daily for 10 consecutive days each month.  Printed prescriptions for each of these medications. Was provided to her. She verbally acknowledged all information that was provided, and agreed with plan of care. She is scheduled to have labs repeated in 4 weeks with a followup with me in 5 weeks.

## 2011-05-22 NOTE — Patient Instructions (Signed)
1. Estradiol: Dissolve 3 tablets (2 mg each), under the tongue daily. 2. Finasteride: Take one tablet (5 mg) by mouth daily. 3. Spiral lactone 25 mg: Take 2 tablets by mouth twice a day. 4. Medroxyprogesterone (Prempro.), 5 mg: Take one tablet by mouth daily for 10 days every month.

## 2011-06-06 ENCOUNTER — Other Ambulatory Visit (INDEPENDENT_AMBULATORY_CARE_PROVIDER_SITE_OTHER): Payer: Self-pay

## 2011-06-06 DIAGNOSIS — B20 Human immunodeficiency virus [HIV] disease: Secondary | ICD-10-CM

## 2011-06-06 LAB — CBC WITH DIFFERENTIAL/PLATELET
Basophils Absolute: 0 10*3/uL (ref 0.0–0.1)
Basophils Relative: 1 % (ref 0–1)
Eosinophils Absolute: 0.1 10*3/uL (ref 0.0–0.7)
Eosinophils Relative: 1 % (ref 0–5)
HCT: 43 % (ref 39.0–52.0)
Lymphocytes Relative: 33 % (ref 12–46)
MCH: 31.1 pg (ref 26.0–34.0)
MCHC: 33.5 g/dL (ref 30.0–36.0)
MCV: 92.9 fL (ref 78.0–100.0)
Monocytes Absolute: 0.4 10*3/uL (ref 0.1–1.0)
Platelets: 224 10*3/uL (ref 150–400)
RDW: 12.7 % (ref 11.5–15.5)
WBC: 6.6 10*3/uL (ref 4.0–10.5)

## 2011-06-07 LAB — COMPLETE METABOLIC PANEL WITH GFR
AST: 22 U/L (ref 0–37)
Albumin: 4.5 g/dL (ref 3.5–5.2)
Alkaline Phosphatase: 59 U/L (ref 39–117)
BUN: 12 mg/dL (ref 6–23)
Creat: 1 mg/dL (ref 0.50–1.35)
GFR, Est Non African American: >60 mL/min (ref >60)
Glucose, Bld: 101 mg/dL — ABNORMAL HIGH (ref 70–99)
Total Bilirubin: 0.2 mg/dL — ABNORMAL LOW (ref 0.3–1.2)

## 2011-06-07 LAB — HIV-1 RNA ULTRAQUANT REFLEX TO GENTYP+
HIV 1 RNA Quant: <20 copies/mL (ref <20)
HIV-1 RNA Quant, Log: <1.3 {Log} (ref <1.30)

## 2011-06-08 LAB — T-HELPER CELL (CD4) - (RCID CLINIC ONLY): CD4 % Helper T Cell: 39 % (ref 33–55)

## 2011-06-11 ENCOUNTER — Other Ambulatory Visit: Payer: Self-pay | Admitting: Adult Health

## 2011-06-11 DIAGNOSIS — F419 Anxiety disorder, unspecified: Secondary | ICD-10-CM

## 2011-06-20 ENCOUNTER — Ambulatory Visit: Payer: Self-pay | Admitting: Adult Health

## 2011-06-22 ENCOUNTER — Encounter: Payer: Self-pay | Admitting: Adult Health

## 2011-06-22 ENCOUNTER — Ambulatory Visit (INDEPENDENT_AMBULATORY_CARE_PROVIDER_SITE_OTHER): Payer: Self-pay | Admitting: Adult Health

## 2011-06-22 VITALS — BP 118/80 | HR 69 | Temp 98.1°F | Ht 74.0 in | Wt 152.8 lb

## 2011-06-22 DIAGNOSIS — F640 Transsexualism: Secondary | ICD-10-CM

## 2011-06-22 DIAGNOSIS — Z0000 Encounter for general adult medical examination without abnormal findings: Secondary | ICD-10-CM

## 2011-06-22 DIAGNOSIS — E349 Endocrine disorder, unspecified: Secondary | ICD-10-CM

## 2011-06-22 DIAGNOSIS — Z23 Encounter for immunization: Secondary | ICD-10-CM

## 2011-07-09 ENCOUNTER — Other Ambulatory Visit: Payer: Self-pay | Admitting: *Deleted

## 2011-07-09 DIAGNOSIS — F419 Anxiety disorder, unspecified: Secondary | ICD-10-CM

## 2011-07-09 MED ORDER — ALPRAZOLAM 1 MG PO TABS: 1.0000 mg | ORAL_TABLET | Freq: Three times a day (TID) | ORAL | Status: DC | PRN

## 2011-08-10 ENCOUNTER — Other Ambulatory Visit: Payer: Self-pay | Admitting: *Deleted

## 2011-08-10 DIAGNOSIS — F419 Anxiety disorder, unspecified: Secondary | ICD-10-CM

## 2011-08-10 MED ORDER — ALPRAZOLAM 1 MG PO TABS: 1.0000 mg | ORAL_TABLET | Freq: Three times a day (TID) | ORAL | Status: DC | PRN

## 2011-09-11 ENCOUNTER — Other Ambulatory Visit: Payer: Self-pay | Admitting: Licensed Clinical Social Worker

## 2011-09-11 DIAGNOSIS — F419 Anxiety disorder, unspecified: Secondary | ICD-10-CM

## 2011-09-11 MED ORDER — ALPRAZOLAM 1 MG PO TABS: 1.0000 mg | ORAL_TABLET | Freq: Three times a day (TID) | ORAL | Status: DC | PRN

## 2011-10-16 ENCOUNTER — Other Ambulatory Visit: Payer: Self-pay | Admitting: Infectious Diseases

## 2011-10-16 DIAGNOSIS — B20 Human immunodeficiency virus [HIV] disease: Secondary | ICD-10-CM

## 2011-10-16 DIAGNOSIS — Z113 Encounter for screening for infections with a predominantly sexual mode of transmission: Secondary | ICD-10-CM

## 2011-10-17 ENCOUNTER — Other Ambulatory Visit (INDEPENDENT_AMBULATORY_CARE_PROVIDER_SITE_OTHER): Payer: Self-pay

## 2011-10-17 ENCOUNTER — Other Ambulatory Visit: Payer: Self-pay | Admitting: Infectious Diseases

## 2011-10-17 DIAGNOSIS — B20 Human immunodeficiency virus [HIV] disease: Secondary | ICD-10-CM

## 2011-10-17 DIAGNOSIS — Z113 Encounter for screening for infections with a predominantly sexual mode of transmission: Secondary | ICD-10-CM

## 2011-10-18 LAB — CBC WITH DIFFERENTIAL/PLATELET
Eosinophils Relative: 1 % (ref 0–5)
Hemoglobin: 13.5 g/dL (ref 13.0–17.0)
Lymphocytes Relative: 34 % (ref 12–46)
Lymphs Abs: 2 10*3/uL (ref 0.7–4.0)
MCV: 93.5 fL (ref 78.0–100.0)
Platelets: 209 10*3/uL (ref 150–400)
RBC: 4.33 MIL/uL (ref 4.22–5.81)
WBC: 5.8 10*3/uL (ref 4.0–10.5)

## 2011-10-18 LAB — COMPREHENSIVE METABOLIC PANEL
ALT: 22 U/L (ref 0–53)
CO2: 30 mEq/L (ref 19–32)
Calcium: 9.4 mg/dL (ref 8.4–10.5)
Chloride: 104 mEq/L (ref 96–112)
Creat: 1.01 mg/dL (ref 0.50–1.35)
Sodium: 142 mEq/L (ref 135–145)
Total Protein: 6.8 g/dL (ref 6.0–8.3)

## 2011-10-18 LAB — RPR

## 2011-10-31 ENCOUNTER — Encounter: Payer: Self-pay | Admitting: Infectious Diseases

## 2011-10-31 ENCOUNTER — Ambulatory Visit (INDEPENDENT_AMBULATORY_CARE_PROVIDER_SITE_OTHER): Payer: Self-pay | Admitting: Infectious Diseases

## 2011-10-31 DIAGNOSIS — Z79899 Other long term (current) drug therapy: Secondary | ICD-10-CM

## 2011-10-31 DIAGNOSIS — B20 Human immunodeficiency virus [HIV] disease: Secondary | ICD-10-CM

## 2011-10-31 NOTE — Assessment & Plan Note (Signed)
Doing very well with the exception of some post-illness fatigue vs seasonal depression. Hep B immune, Hep A immune. He is offered condoms (refuses). Vax up to date. Will see him back in 5-6 months with labs prior.

## 2011-10-31 NOTE — Progress Notes (Signed)
  Subjective:    Patient ID: Renee Malone, female    DOB: 1986-06-16, 26 y.o.   MRN: [id number]  HPI 26 yo M with HIV+. Was Dx HIV+ 06-17-09. Has been on atripla as only medication.   CD4 880, VL<20 (10-17-11). Has been taking estrogen. For last several weeks has been sick, cold- cough, congestion, rhinitis. Fatigued. Decreased appetite.     Review of Systems  Constitutional: Positive for appetite change. Negative for unexpected weight change.  Respiratory: Positive for cough. Negative for shortness of breath.        "slight" cough. Non-prod.   Gastrointestinal: Negative for diarrhea and constipation.  Genitourinary: Negative for dysuria.  Neurological: Negative for headaches.       Objective:   Physical Exam  Constitutional: He appears well-developed and well-nourished.  HENT:  Head: Normocephalic.  Mouth/Throat: No oropharyngeal exudate.  Eyes: EOM are normal. Pupils are equal, round, and reactive to light.  Neck: Neck supple.  Cardiovascular: Normal rate, regular rhythm and normal heart sounds.   Pulmonary/Chest: Effort normal and breath sounds normal. No respiratory distress. He has no wheezes. He has no rales.  Abdominal: Soft. Bowel sounds are normal. He exhibits no distension. There is no tenderness.  Lymphadenopathy:    He has no cervical adenopathy.          Assessment & Plan:

## 2011-11-07 NOTE — Progress Notes (Signed)
Patient ID: Renee Malone, female   DOB: 19-May-1986, 26 y.o.   MRN: [id number]  Patient in for discussion of HRT.  Reviewed various protocols and agreed on select protocol that includes Spironolactone, Premarin and Progesterone.  Questions answered.  Protocol reviewed.    Hormonal imbalance in transgender patient Medication regimen, effects, side effects, ADRs and treatment limitations discussed in detail.  Verbally acknowledged all information that was provided and agreed with plan of care.   Kamyah Wilhelmsen A. Sundra Aland, MS, [facility] [phone]  11/07/2011, 11:23 AM

## 2011-11-07 NOTE — Assessment & Plan Note (Signed)
Medication regimen, effects, side effects, ADRs and treatment limitations discussed in detail.  Verbally acknowledged all information that was provided and agreed with plan of care.

## 2011-11-15 ENCOUNTER — Other Ambulatory Visit: Payer: Self-pay | Admitting: *Deleted

## 2011-11-15 DIAGNOSIS — B20 Human immunodeficiency virus [HIV] disease: Secondary | ICD-10-CM

## 2011-11-15 MED ORDER — EFAVIRENZ-EMTRICITAB-TENOFOVIR 600-200-300 MG PO TABS: 1.0000 | ORAL_TABLET | Freq: Every day | ORAL | Status: DC

## 2011-12-07 ENCOUNTER — Ambulatory Visit: Payer: Self-pay

## 2011-12-14 ENCOUNTER — Other Ambulatory Visit: Payer: Self-pay | Admitting: Infectious Diseases

## 2011-12-17 ENCOUNTER — Ambulatory Visit: Payer: Self-pay

## 2012-01-02 ENCOUNTER — Other Ambulatory Visit: Payer: Self-pay | Admitting: Infectious Diseases

## 2012-01-07 ENCOUNTER — Telehealth: Payer: Self-pay | Admitting: *Deleted

## 2012-01-07 NOTE — Telephone Encounter (Signed)
Patient called advised that he is just not feeling himself and has a red rash that pops up from time to time. Said that his lymph glands are swollen and he wants to be seen if possible. Advised patient that it may be a few weeks before he can be seen.Transferred patient to the front to schedule an appointment

## 2012-01-08 ENCOUNTER — Ambulatory Visit (INDEPENDENT_AMBULATORY_CARE_PROVIDER_SITE_OTHER): Payer: Self-pay | Admitting: Internal Medicine

## 2012-01-08 VITALS — BP 111/76 | HR 76 | Temp 97.6°F | Ht 74.0 in | Wt 157.2 lb

## 2012-01-08 DIAGNOSIS — F329 Major depressive disorder, single episode, unspecified: Secondary | ICD-10-CM

## 2012-01-08 DIAGNOSIS — B20 Human immunodeficiency virus [HIV] disease: Secondary | ICD-10-CM

## 2012-01-08 NOTE — Progress Notes (Signed)
Patient ID: Renee Malone, female   DOB: 1986-01-04, 26 y.o.   MRN: [id number]  INFECTIOUS DISEASE PROGRESS NOTE    Subjective: Renee Malone is in for an acute care visit. She has been feeling more stressed and anxious recently. She is noted several small red spots on her skin over the past year has become concerned about them. She's also had a slight sore throat for the past 3 days. She has not had any fever, chills, sweats or cough.  She has had a history of chronic depression and anxiety. She took Paxil and Zoloft in the past and felt like it made her feel slightly less depressed but both seem to exacerbate her anxiety. Currently she is stressed about not having a job. 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 She would like to quit doing that but would have no source of income. She saw a counselor last year but did not feel that it helped very much. She talks frequently with friends, her grandmother and an aunt who live nearby and feels like they are her source of support.  Objective: Temp: 97.6 F (36.4 C) (04/02 1456) Temp src: Oral (04/02 1456) BP: 111/76 mmHg (04/02 1456) Pulse Rate: 76  (04/02 1456)  General: Thin, healthy-appearing; she is transgender female to female Skin: She has 3 very tiny red spots, one on her left forehead, one on her abdomen and one on the mons pubis that look like small telangiectasias Oral: Mouth and throat are clear Lungs: Clear Cor: Regular S1-S2 no murmurs Abdomen: Soft nontender   Lab Results HIV 1 RNA Quant (copies/mL)  Date Value  10/17/2011 <20   06/06/2011 <20   03/14/2011 289*     CD4 T Cell Abs (cmm)  Date Value  10/17/2011 880   06/06/2011 850   03/14/2011 810      Assessment: Her HIV infection remains under excellent control. I will continue Atripla.  I suspect that her telangiectasias are related to her estrogen hormone therapy.  She is currently not interested in counseling or antidepressant therapy. She is not suicidal and  does have a circle of support. I've asked her to call me if she is feeling worse between now and her next visit.  Plan: 1. Continue current medications 2. Return to clinic after lab work in 3 months   Cliffton Asters, MD [facility] [phone] pager   [phone] cell 01/08/2012, 3:26 PM

## 2012-01-10 ENCOUNTER — Other Ambulatory Visit: Payer: Self-pay | Admitting: Infectious Diseases

## 2012-01-10 DIAGNOSIS — F419 Anxiety disorder, unspecified: Secondary | ICD-10-CM

## 2012-01-10 NOTE — Telephone Encounter (Signed)
Pt wants more alprazolam that he usually gets. I told him I would ask md before I called the refill in

## 2012-01-11 ENCOUNTER — Telehealth: Payer: Self-pay | Admitting: *Deleted

## 2012-01-11 DIAGNOSIS — F419 Anxiety disorder, unspecified: Secondary | ICD-10-CM

## 2012-01-11 MED ORDER — ALPRAZOLAM 1 MG PO TABS: 1.0000 mg | ORAL_TABLET | Freq: Three times a day (TID) | ORAL | Status: DC | PRN

## 2012-01-11 NOTE — Telephone Encounter (Signed)
Called it in 

## 2012-04-14 ENCOUNTER — Other Ambulatory Visit: Payer: Self-pay

## 2012-04-14 DIAGNOSIS — B20 Human immunodeficiency virus [HIV] disease: Secondary | ICD-10-CM

## 2012-04-14 DIAGNOSIS — Z79899 Other long term (current) drug therapy: Secondary | ICD-10-CM

## 2012-04-14 LAB — LIPID PANEL
HDL: 48 mg/dL (ref >39)
Triglycerides: 47 mg/dL (ref <150)

## 2012-04-14 LAB — COMPREHENSIVE METABOLIC PANEL
Albumin: 4.3 g/dL (ref 3.5–5.2)
BUN: 12 mg/dL (ref 6–23)
Calcium: 9.4 mg/dL (ref 8.4–10.5)
Chloride: 105 mEq/L (ref 96–112)
Glucose, Bld: 70 mg/dL (ref 70–99)
Potassium: 4.2 mEq/L (ref 3.5–5.3)

## 2012-04-14 LAB — CBC
HCT: 40.8 % (ref 39.0–52.0)
Hemoglobin: 13.6 g/dL (ref 13.0–17.0)
RBC: 4.41 MIL/uL (ref 4.22–5.81)
WBC: 6.6 10*3/uL (ref 4.0–10.5)

## 2012-04-15 LAB — T-HELPER CELL (CD4) - (RCID CLINIC ONLY): CD4 T Cell Abs: 870 uL (ref 400–2700)

## 2012-04-15 LAB — HIV-1 RNA QUANT-NO REFLEX-BLD: HIV-1 RNA Quant, Log: <1.3 {Log} (ref <1.30)

## 2012-04-28 ENCOUNTER — Encounter: Payer: Self-pay | Admitting: Infectious Diseases

## 2012-04-28 ENCOUNTER — Ambulatory Visit (INDEPENDENT_AMBULATORY_CARE_PROVIDER_SITE_OTHER): Payer: Self-pay | Admitting: Infectious Diseases

## 2012-04-28 VITALS — BP 116/78 | HR 67 | Temp 98.2°F | Wt 164.0 lb

## 2012-04-28 DIAGNOSIS — F329 Major depressive disorder, single episode, unspecified: Secondary | ICD-10-CM

## 2012-04-28 DIAGNOSIS — B20 Human immunodeficiency virus [HIV] disease: Secondary | ICD-10-CM

## 2012-04-28 NOTE — Progress Notes (Signed)
  Subjective:    Patient ID: Renee Malone, female    DOB: 1986-05-20, 26 y.o.   MRN: [id number]  HPI 26 yo M with HIV+. Was Dx HIV+ 06-17-09. Has been on atripla as only medication. Has been taking estrogen.  Was seen in April 2013 for depression. Since has been feeling so-so. Since last visit has had "serious depressing bouts". Has decreased interest in keeping house clean. Has been sleeping more. Getting random, sharp pains in chest- pain is unchanged with eating, no SOB, no numbness in arms, no radiation into back. Stressed about bills, meeting with disability tomorrow, feeling like he has no support, has felt like quit taking his meds (his SI equivalent).  Takes xanax for panic attacks, which only eases doesn't stop. Also has episodes "fits of fury".   Has been over-eating. Wt up 14# over last year.     Review of Systems     Objective:   Physical Exam  Constitutional: Vital signs are normal.  Non-toxic appearance.    HENT:  Mouth/Throat: No oropharyngeal exudate.  Eyes: EOM are normal. Pupils are equal, round, and reactive to light.  Neck: Neck supple.  Cardiovascular: Normal rate, regular rhythm and normal heart sounds.   Pulmonary/Chest: Effort normal and breath sounds normal.  Abdominal: Soft. Bowel sounds are normal. There is no tenderness.  Lymphadenopathy:    He has no cervical adenopathy.  Neurological: He is alert.  Psychiatric: His mood appears not anxious. His affect is not angry, not blunt, not labile and not inappropriate. His speech is not rapid and/or pressured, not delayed, not tangential and not slurred. He is not agitated and not aggressive. He exhibits a depressed mood. He is communicative.          Assessment & Plan:

## 2012-04-28 NOTE — Assessment & Plan Note (Signed)
He is depressed today. Will get him into family services of the Oncologist. He would benefit from anit-depressant but does not want to take SSRI/zoloft again. I encouraged him to exercise. Will see him back in 6 weeks.

## 2012-04-28 NOTE — Assessment & Plan Note (Addendum)
Appears well controlled. Will cont current meds although changing atripla could change his mood? Offered/refused condoms (not active). Will recheck his labs in 6 months.

## 2012-05-01 ENCOUNTER — Ambulatory Visit: Payer: Self-pay
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
## 2012-05-08 ENCOUNTER — Ambulatory Visit: Payer: Self-pay

## 2012-05-13 ENCOUNTER — Ambulatory Visit: Payer: Self-pay

## 2012-05-20 ENCOUNTER — Ambulatory Visit: Payer: Self-pay

## 2012-05-22 ENCOUNTER — Ambulatory Visit: Payer: Self-pay
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
## 2012-05-29 ENCOUNTER — Ambulatory Visit: Payer: Self-pay

## 2012-05-29 DIAGNOSIS — F331 Major depressive disorder, recurrent, moderate: Secondary | ICD-10-CM

## 2012-05-29 NOTE — Progress Notes (Signed)
I met with Renee Malone today and he continues to report frustrations with his family - mainly their expectation for him to transport his grandmother to medical appointments despite the fact that his brother lives with her.  He says his brother is a Higher education careers adviser and is unreliable.  He said that when he found out he was HIV+ everything changed.  He says he was devastated by this diagnosis and although he is undetectable now, he still struggles with the knowledge that he has it.  Once his grandmother dies (she has heart disease and is obese), he says he plans to cut himself off from his family, which will take some of his stress away.  I offered to facilitate a guided relaxation exercise today, which I do with clients prior to EMDR work (trauma reprocessing), but he showed little interest in doing it, so I just let him spend the session venting.  He says it is helpful to come in and talk about these stressors.  Plan to meet in one week.

## 2012-05-31 ENCOUNTER — Other Ambulatory Visit: Payer: Self-pay | Admitting: Adult Health

## 2012-06-04 ENCOUNTER — Other Ambulatory Visit: Payer: Self-pay | Admitting: *Deleted

## 2012-06-04 ENCOUNTER — Telehealth: Payer: Self-pay | Admitting: *Deleted

## 2012-06-04 DIAGNOSIS — F640 Transsexualism: Secondary | ICD-10-CM

## 2012-06-04 DIAGNOSIS — E349 Endocrine disorder, unspecified: Secondary | ICD-10-CM

## 2012-06-04 MED ORDER — SPIRONOLACTONE 50 MG PO TABS: 50.0000 mg | ORAL_TABLET | Freq: Two times a day (BID) | ORAL | Status: DC

## 2012-06-04 MED ORDER — ESTRADIOL 2 MG PO TABS: 6.0000 mg | ORAL_TABLET | Freq: Every day | ORAL | Status: DC

## 2012-06-04 MED ORDER — SPIRONOLACTONE 25 MG PO TABS: 50.0000 mg | ORAL_TABLET | Freq: Two times a day (BID) | ORAL | Status: DC

## 2012-06-04 NOTE — Telephone Encounter (Signed)
The pharmacy called and asked if we could change this Rx from spironolactone 50 mg 1 tab 2x daily to 25 mg 2 tabs 2x daily as it is cheaper for the patient and is the same total mg's. Advised will change the Rx for the next order and make a note in the patient chart.

## 2012-06-04 NOTE — Telephone Encounter (Signed)
Patient called from the pharmacy very upset. He advised that he went to pick up his Rx for Spironolactone and Estradiol and it is incorrect. He advised that he usually gets Spironolactone 50 mg 1 tab twice a day (total 100 mg daily) #60 a month or 90 day supply at #180  and Estradiol 2 mg 3 tabs once a day # 90 or 90 day supply at #270 all this per Traci Sermon instructions. However he transferred pharmacies and when he went to pick them up this month he was given Spironolactone 25 mg 1 tab daily # 90 and Estradiol 2 mg 1 tab daily #90. He is very emotional and upset and wants this fixed. I advised the patient would have to check with his doctor before I could change it but that as soon as I heard something I would call him back. He also requested that he get printed Rx's this time.  Spoke with Dr Ninetta Lights and given the [facility] to increase back to reg levels any further changes or concerns will be discussed at next visit

## 2012-06-05 ENCOUNTER — Ambulatory Visit: Payer: Self-pay

## 2012-06-05 DIAGNOSIS — F4310 Post-traumatic stress disorder, unspecified: Secondary | ICD-10-CM

## 2012-06-05 DIAGNOSIS — F331 Major depressive disorder, recurrent, moderate: Secondary | ICD-10-CM

## 2012-06-05 NOTE — Progress Notes (Signed)
I met with Renee Malone today and he reported that he continues to struggle with anxiety and depression.  He talked about how he worries about his grandmother, who continues to have medical issues.  He also talked about his brother, who he says is dealing drugs out of their grandmother's house and how Renee Malone plans to kick him out when she dies.  I provided some psycho-education on mindfulness meditation, which he seemed interested in.  Plan to meet again in one week.

## 2012-06-11 ENCOUNTER — Encounter: Payer: Self-pay | Admitting: Infectious Diseases

## 2012-06-11 ENCOUNTER — Ambulatory Visit (INDEPENDENT_AMBULATORY_CARE_PROVIDER_SITE_OTHER): Payer: Self-pay | Admitting: Infectious Diseases

## 2012-06-11 VITALS — BP 112/72 | HR 67 | Temp 98.1°F | Ht 74.5 in | Wt 166.0 lb

## 2012-06-11 DIAGNOSIS — B20 Human immunodeficiency virus [HIV] disease: Secondary | ICD-10-CM

## 2012-06-11 DIAGNOSIS — Z79899 Other long term (current) drug therapy: Secondary | ICD-10-CM

## 2012-06-11 DIAGNOSIS — Z113 Encounter for screening for infections with a predominantly sexual mode of transmission: Secondary | ICD-10-CM

## 2012-06-11 DIAGNOSIS — F329 Major depressive disorder, single episode, unspecified: Secondary | ICD-10-CM

## 2012-06-11 DIAGNOSIS — M779 Enthesopathy, unspecified: Secondary | ICD-10-CM

## 2012-06-11 NOTE — Assessment & Plan Note (Signed)
Doing very well. Will continue his current rx. Offered, refused condoms ( I have plenty of those). Will plan to see back in 6 months with labs prior. Wants to have handicap permit filled out. With the degree of injury to his ankle, i would support this. Needs flu shot when available.

## 2012-06-11 NOTE — Progress Notes (Signed)
  Subjective:    Patient ID: Renee Malone, female    DOB: 1986-01-04, 26 y.o.   MRN: [id number]  HPI 26 yo M-->F with HIV+. Was Dx HIV+ 06-17-09. Has been on atripla as only HIV medication. Has been taking estrogen/provera as well.  Was seen in April 2013 for depression and has been getting f/u at with mental health counselor. Feels like making some progress with issues didn't know he had.Has been having difficulty with sleep. 2-3 hours at night and daytime napping.  Feels like having aches and pains with medication. Has been gaining wt as well.  Having pain in tendon in R foot (relates to accident 30 years ago).   HIV 1 RNA Quant (copies/mL)  Date Value  04/14/2012 <20   10/17/2011 <20   06/06/2011 <20      CD4 T Cell Abs (cmm)  Date Value  04/14/2012 870   10/17/2011 880   06/06/2011 850       Review of Systems  Constitutional: Negative for appetite change and unexpected weight change.  Gastrointestinal: Negative for diarrhea and constipation.  Genitourinary: Negative for dysuria.  Psychiatric/Behavioral: Positive for disturbed wake/sleep cycle.       Objective:   Physical Exam  Constitutional: He appears well-developed and well-nourished.  HENT:  Mouth/Throat: No oropharyngeal exudate.  Eyes: EOM are normal. Pupils are equal, round, and reactive to light.  Neck: Neck supple.  Cardiovascular: Normal rate, regular rhythm and normal heart sounds.   Pulmonary/Chest: Effort normal and breath sounds normal.  Abdominal: Soft. Bowel sounds are normal. He exhibits no distension. There is no tenderness.  Musculoskeletal: He exhibits no edema.       Feet:  Lymphadenopathy:    He has no cervical adenopathy.          Assessment & Plan:

## 2012-06-11 NOTE — Assessment & Plan Note (Signed)
Greatly appreciate care of mental health counselor. Will cont to f/u with him.

## 2012-06-11 NOTE — Assessment & Plan Note (Signed)
Encouraged to try mortin/ibuprofen with food. There is significant scarring here, not sure if surgical f/u warranted.

## 2012-06-12 ENCOUNTER — Ambulatory Visit: Payer: Self-pay

## 2012-06-17 ENCOUNTER — Other Ambulatory Visit: Payer: Self-pay | Admitting: *Deleted

## 2012-06-17 DIAGNOSIS — B20 Human immunodeficiency virus [HIV] disease: Secondary | ICD-10-CM

## 2012-06-17 MED ORDER — EFAVIRENZ-EMTRICITAB-TENOFOVIR 600-200-300 MG PO TABS: 1.0000 | ORAL_TABLET | Freq: Every day | ORAL | Status: DC

## 2012-06-19 ENCOUNTER — Ambulatory Visit: Payer: Self-pay

## 2012-06-19 ENCOUNTER — Ambulatory Visit (INDEPENDENT_AMBULATORY_CARE_PROVIDER_SITE_OTHER): Payer: Self-pay

## 2012-06-19 DIAGNOSIS — F331 Major depressive disorder, recurrent, moderate: Secondary | ICD-10-CM

## 2012-06-19 DIAGNOSIS — Z23 Encounter for immunization: Secondary | ICD-10-CM

## 2012-06-19 NOTE — Progress Notes (Signed)
Renee Malone, Rose) reported that he has been napping during the day lately.  He said he was upset yesterday when he had to sell his [employer] Niece (he still owns a car) in order to pay a fine for parking in a handicapped spot.  He tried to fight this ticket but got nowhere.  He says his appetite has increased and he has gained 20-25 lbs in the past 2 months.  We discussed diagnosis and he endorses multiple symptoms of Bipolar Disorder, but it is unclear if he is truly Bipolar.  He reports frequent irritability, extreme mood swings, distractibility, depressive moods, impulsivity, engaging in risky behaviors.  I talked with him about the possibility of going to [facility] [place] for psychiatric medication management, which has recently become available.  He was unsure of this, but seemed to be considering it.  He continues to express frustration over how his family leaves him in charge of looking after his grandmother, who is in poor health.  Plan to meet in one week.

## 2012-06-26 ENCOUNTER — Ambulatory Visit: Payer: Self-pay

## 2012-07-03 ENCOUNTER — Ambulatory Visit: Payer: Self-pay

## 2012-07-03 DIAGNOSIS — F331 Major depressive disorder, recurrent, moderate: Secondary | ICD-10-CM

## 2012-07-03 NOTE — Progress Notes (Signed)
I met with Renee Malone again today and he said that a couple of days ago he got a lot of sleep - sleeping all night, then staying in bed most of the next day, dozing off throughout the day.  He said this was good because he has not slept enough lately.  He said he has been trying to focus more on himself and less on his family in the past week.  He also talked about a female neighbor that he is interested in.  Renee Malone invited him to a local bar, but he declined.  Then he said he saw an attractive woman with him on his balcony and this embarrassed Renee Malone, because he thought this man was gay.  Overall, his mood seems to be better today than in previous sessions.  Plan to meet again in one week.

## 2012-07-10 ENCOUNTER — Ambulatory Visit: Payer: Self-pay

## 2012-07-20 ENCOUNTER — Other Ambulatory Visit: Payer: Self-pay | Admitting: Internal Medicine

## 2012-07-24 ENCOUNTER — Ambulatory Visit: Payer: Self-pay

## 2012-07-24 NOTE — Progress Notes (Signed)
I met with "Renee Malone" today and he reported that his grandmother is back in the hospital.  This prompted his mother to come to [facility] and she began calling him every morning to run errands for her.  This, he reports has stressed him to the point that he finally blew up yesterday and told her he can only do so much.  He also talked about dating a guy recently and on the 2nd date he admitted that while they were at a club (Rose was working, taking money at the door), he "made out" with a woman.  He told the guy that he was through with him.  He also talked about his plans to follow through with a sex transformation, beginning with a breast augmentation here in [place] and eventually a castration out in [place].  He then talked about his depression and anger.  I suggested he raise his awareness of his anger and pay attention to it instead of pushing it away as he is doing now.  He talked about his "insatiable" sexual appetite, especially when his friend Lanice Schwab is around.  He is not attracted to her, but they both seem to bring out a stronger sexual urge to find partners.  Plan to meet in 2 weeks.

## 2012-08-07 ENCOUNTER — Ambulatory Visit: Payer: Self-pay

## 2012-08-27 ENCOUNTER — Telehealth: Payer: Self-pay | Admitting: *Deleted

## 2012-08-27 ENCOUNTER — Other Ambulatory Visit: Payer: Self-pay | Admitting: *Deleted

## 2012-08-27 DIAGNOSIS — F419 Anxiety disorder, unspecified: Secondary | ICD-10-CM

## 2012-08-27 MED ORDER — ALPRAZOLAM 1 MG PO TABS: 1.0000 mg | ORAL_TABLET | Freq: Three times a day (TID) | ORAL | Status: DC | PRN

## 2012-08-27 NOTE — Telephone Encounter (Signed)
Patient called requesting a refill on her Xanax, it previously had been denied as it was last filled in April with 2 refills and was unclear as to the need.  She has been working with a counselor [employer] and has been trying to use it sparingly.  Approved by Dr. Ninetta Lights and called to [facility] pharmacy.  Patient encouraged to take this medication only as needed and to continue her appointments with her counselor. Wendall Mola CMA

## 2012-09-23 ENCOUNTER — Ambulatory Visit: Payer: Self-pay

## 2012-09-23 DIAGNOSIS — F331 Major depressive disorder, recurrent, moderate: Secondary | ICD-10-CM

## 2012-09-23 DIAGNOSIS — F410 Panic disorder [episodic paroxysmal anxiety] without agoraphobia: Secondary | ICD-10-CM

## 2012-09-23 NOTE — Progress Notes (Signed)
I met with Renee Malone today for the first time in a while and he reported that things have not been going well.  He reports poor sleep, anger outbursts, panic attacks (sometimes 2 a day), depressed mood (3 on a scale of 10), and no energy.  I explored with him the possibility of a diagnosis of bipolar disorder and he described a recent shopping spree he went on and for which he was angry at himself later.  I suggested he be seen by the psychiatric nurse practitioner at [facility] and he was interested.  Plan to meet in 2 weeks.

## 2012-10-06 ENCOUNTER — Ambulatory Visit: Payer: Self-pay

## 2012-11-10 ENCOUNTER — Ambulatory Visit: Payer: Self-pay

## 2012-11-13 ENCOUNTER — Ambulatory Visit: Payer: Self-pay

## 2012-11-18 ENCOUNTER — Telehealth: Payer: Self-pay | Admitting: *Deleted

## 2012-11-18 ENCOUNTER — Ambulatory Visit: Payer: Self-pay

## 2012-11-18 NOTE — Progress Notes (Unsigned)
Renee Malone came in today talking about being under a lot of stress, due to several factors including the fact that his mother has been staying with him for the past few weeks.  Also he is in a new relationship, but his friend just found out he missed a court date yesterday.  He talked about having new chest pains, unlike his usual anxiety related tightness in his chest.  I related this to his nurse and encouraged him to talk with his doctor about this.

## 2012-11-18 NOTE — Telephone Encounter (Signed)
Patient was in for counseling and mentioned that he was having chest pains. They are different from normal. He wants to be seen. Gave him an appt 11/19/12 1015 with Dr Ninetta Lights. He has been under a lot of stress lately but this feels different.

## 2012-11-19 ENCOUNTER — Ambulatory Visit (INDEPENDENT_AMBULATORY_CARE_PROVIDER_SITE_OTHER): Payer: Self-pay | Admitting: Infectious Diseases

## 2012-11-19 ENCOUNTER — Ambulatory Visit (HOSPITAL_COMMUNITY)
Admission: RE | Admit: 2012-11-19 | Discharge: 2012-11-19 | Disposition: A | Discharge status: Home / Self Care | Payer: Self-pay | Source: Ambulatory Visit | Attending: Infectious Diseases | Admitting: Infectious Diseases

## 2012-11-19 ENCOUNTER — Encounter: Payer: Self-pay | Admitting: Infectious Diseases

## 2012-11-19 VITALS — BP 120/75 | HR 66 | Temp 98.1°F | Ht 75.0 in | Wt 167.0 lb

## 2012-11-19 DIAGNOSIS — R079 Chest pain, unspecified: Secondary | ICD-10-CM | POA: Insufficient documentation

## 2012-11-19 DIAGNOSIS — Z79899 Other long term (current) drug therapy: Secondary | ICD-10-CM

## 2012-11-19 DIAGNOSIS — Z113 Encounter for screening for infections with a predominantly sexual mode of transmission: Secondary | ICD-10-CM

## 2012-11-19 DIAGNOSIS — F329 Major depressive disorder, single episode, unspecified: Secondary | ICD-10-CM

## 2012-11-19 DIAGNOSIS — B20 Human immunodeficiency virus [HIV] disease: Secondary | ICD-10-CM

## 2012-11-19 DIAGNOSIS — F3289 Other specified depressive episodes: Secondary | ICD-10-CM

## 2012-11-19 LAB — CBC
Hemoglobin: 15.6 g/dL (ref 13.0–17.0)
MCH: 31 pg (ref 26.0–34.0)
RBC: 5.04 MIL/uL (ref 4.22–5.81)
WBC: 6.9 10*3/uL (ref 4.0–10.5)

## 2012-11-19 LAB — COMPREHENSIVE METABOLIC PANEL
ALT: 18 U/L (ref 0–53)
AST: 23 U/L (ref 0–37)
Albumin: 4.9 g/dL (ref 3.5–5.2)
CO2: 32 mEq/L (ref 19–32)
Calcium: 10.1 mg/dL (ref 8.4–10.5)
Chloride: 102 mEq/L (ref 96–112)
Potassium: 4.1 mEq/L (ref 3.5–5.3)
Sodium: 141 mEq/L (ref 135–145)
Total Protein: 7.6 g/dL (ref 6.0–8.3)

## 2012-11-19 LAB — LIPID PANEL
HDL: 54 mg/dL (ref >39)
LDL Cholesterol: 84 mg/dL (ref 0–99)
Total CHOL/HDL Ratio: 2.7 Ratio

## 2012-11-19 LAB — CK: Total CK: 184 U/L (ref 7–232)

## 2012-11-19 NOTE — Progress Notes (Signed)
  Subjective:    Patient ID: Renee Malone, female    DOB: 02-18-1986, 27 y.o.   MRN: [id number]  Anxiety Symptoms include chest pain and nervous/anxious behavior. Patient reports no palpitations or shortness of breath.     27 yo M-->F with HIV+. Was Dx HIV+ 06-17-09. Has been on atripla as only HIV medication. Has been taking estrogen/provera as well.  Was seen in April 2013 for depression and has been getting f/u at with mental health counselor. Has been having chest pain for 1 month. Stabbing. L arm went numb with initial episode, SOB. Went away after 10-20 minutes. Took no rx for pain. Since has had more episodes. More recently has been getting better with relaxation breathing. No further numbness. No diaphoresis. Had nausea with episodes. Does not feel like his previous episodes of heart burn or panic attacks.   HIV 1 RNA Quant (copies/mL)  Date Value  04/14/2012 <20   10/17/2011 <20   06/06/2011 <20      CD4 T Cell Abs (cmm)  Date Value  04/14/2012 870   10/17/2011 880   06/06/2011 850       Review of Systems  Constitutional: Negative for fever, chills, appetite change and unexpected weight change.  HENT: Negative for trouble swallowing.   Respiratory: Positive for chest tightness. Negative for shortness of breath.   Cardiovascular: Positive for chest pain. Negative for palpitations and leg swelling.  Gastrointestinal: Negative for diarrhea and constipation.  Genitourinary: Negative for difficulty urinating.  Psychiatric/Behavioral: The patient is nervous/anxious.        Objective:   Physical Exam  Constitutional: He appears well-developed and well-nourished.  HENT:  Mouth/Throat: No oropharyngeal exudate.  Eyes: EOM are normal. Pupils are equal, round, and reactive to light.  Neck: Neck supple.  Cardiovascular: Normal rate, regular rhythm and normal heart sounds.   Pulmonary/Chest: Effort normal and breath sounds normal. No respiratory distress. He exhibits no tenderness.   Abdominal: Soft. Bowel sounds are normal. There is no tenderness.  Musculoskeletal: He exhibits no edema.  Lymphadenopathy:    He has no cervical adenopathy.          Assessment & Plan:

## 2012-11-19 NOTE — Addendum Note (Signed)
Addended by: Mariea Clonts D on: 11/19/2012 11:40 AM   Modules accepted: Orders

## 2012-11-19 NOTE — Assessment & Plan Note (Signed)
ECG is sinus brady 57, no ST-T changes. Will send for stress.

## 2012-11-19 NOTE — Assessment & Plan Note (Addendum)
Appears to be doing well. Will check labs, has not had since July. Will offer condoms. vax up to date. See back in 6 months. Denies hx of retctl lesions or sores or warts. Given info about anal pap.

## 2012-11-19 NOTE — Assessment & Plan Note (Signed)
Continues to f/u with Mental Health, my great appreciation to them for partnering with [place].

## 2012-11-20 LAB — RPR

## 2012-11-21 LAB — DRUG SCREEN, URINE
Barbiturate Quant, Ur: NEGATIVE
Benzodiazepines.: NEGATIVE
Cocaine Metabolites: NEGATIVE
Creatinine,U: 530.83 mg/dL
Methadone: NEGATIVE

## 2012-11-21 LAB — HEPATITIS A ANTIBODY, TOTAL: Hep A Total Ab: POSITIVE — AB

## 2012-11-24 LAB — HIV-1 RNA QUANT-NO REFLEX-BLD: HIV 1 RNA Quant: <20 copies/mL (ref <20)

## 2012-12-01 ENCOUNTER — Ambulatory Visit: Payer: Self-pay

## 2012-12-01 ENCOUNTER — Other Ambulatory Visit: Payer: Self-pay

## 2012-12-03 ENCOUNTER — Telehealth: Payer: Self-pay | Admitting: *Deleted

## 2012-12-03 NOTE — Telephone Encounter (Signed)
Called and notified patient of his appt with [facility] for 12/29/12 at 10:45 AM with Dr. Antoine Poche. Wendall Mola

## 2012-12-04 ENCOUNTER — Ambulatory Visit: Payer: No Typology Code available for payment source

## 2012-12-04 DIAGNOSIS — F331 Major depressive disorder, recurrent, moderate: Secondary | ICD-10-CM

## 2012-12-04 DIAGNOSIS — F4310 Post-traumatic stress disorder, unspecified: Secondary | ICD-10-CM

## 2012-12-04 NOTE — Progress Notes (Signed)
Renee Malone reported today that his recent relationship fell apart shortly after his last session with me 2 weeks ago.  He found out that the fellow was cheating on him and he ended it immediately.  He also talked about his mother and grandmother and how they "get on his nerves" to the extent that he wants to just end all contact with them sometimes.  It does sound like they ask a lot of him in the way of driving his mother to work when her car isn't working or running his grandmother to medical appointments.  He said he fluctuates between anger and depression, rating his mood at 2-3 on a 10 pt scale.  He denied suicidality, but did say that if it got to that point he would just stop taking his medications.  He says he is not sleeping well -- 3-4 hours/night currently.  He also said he would be willing to take an antidepressant at this point.  Plan to meet in 2 weeks.

## 2012-12-15 ENCOUNTER — Encounter: Payer: Self-pay | Admitting: Infectious Diseases

## 2012-12-15 ENCOUNTER — Ambulatory Visit (INDEPENDENT_AMBULATORY_CARE_PROVIDER_SITE_OTHER): Payer: No Typology Code available for payment source | Admitting: Infectious Diseases

## 2012-12-15 VITALS — BP 113/76 | HR 60 | Temp 98.3°F | Ht 75.0 in | Wt 163.0 lb

## 2012-12-15 DIAGNOSIS — F32A Depression, unspecified: Secondary | ICD-10-CM

## 2012-12-15 DIAGNOSIS — B20 Human immunodeficiency virus [HIV] disease: Secondary | ICD-10-CM

## 2012-12-15 DIAGNOSIS — F640 Transsexualism: Secondary | ICD-10-CM

## 2012-12-15 DIAGNOSIS — F329 Major depressive disorder, single episode, unspecified: Secondary | ICD-10-CM

## 2012-12-15 DIAGNOSIS — E349 Endocrine disorder, unspecified: Secondary | ICD-10-CM

## 2012-12-15 DIAGNOSIS — IMO0001 Reserved for inherently not codable concepts without codable children: Secondary | ICD-10-CM

## 2012-12-15 DIAGNOSIS — R079 Chest pain, unspecified: Secondary | ICD-10-CM

## 2012-12-15 MED ORDER — CITALOPRAM HYDROBROMIDE 10 MG PO TABS: 10.0000 mg | ORAL_TABLET | Freq: Every day | ORAL | Status: DC

## 2012-12-15 NOTE — Assessment & Plan Note (Signed)
Doing very well. F/u Hep B SAb+. Offered condoms, has at home. Will get in touch with health care navigator for health insurance. See back in 6 months.

## 2012-12-15 NOTE — Assessment & Plan Note (Signed)
Has eval scheduled.

## 2012-12-15 NOTE — Progress Notes (Signed)
  Subjective:    Patient ID: Renee Malone, female    DOB: 25-Jul-1986, 27 y.o.   MRN: [id number]  HPI 27 yo M-->F with HIV+. Was Dx HIV+ 06-17-09. Has been on atripla as only HIV medication. Has been taking estrogen/provera as well.  Was seen in April 2013 for depression and has been getting f/u at with mental health counselor. Asks about starting anti-depressant. Sleeping poorly- not clear what keeps s/him awake. When unable to sleep, watches TV. Had LAN a few days ago but has since resolved.  Quit taking hormones til she has eval at [facility]. Feels embarassed as he is "deflating" now.   HIV 1 RNA Quant (copies/mL)  Date Value  11/19/2012 <20   04/14/2012 <20   10/17/2011 <20      CD4 T Cell Abs (cmm)  Date Value  11/19/2012 780   04/14/2012 870   10/17/2011 880       Review of Systems  Constitutional: Positive for appetite change. Negative for fever, chills and unexpected weight change.  Respiratory: Negative for shortness of breath.   Cardiovascular: Positive for chest pain.  Gastrointestinal: Negative for nausea and diarrhea.  Genitourinary: Negative for dysuria.  Psychiatric/Behavioral: Positive for sleep disturbance and dysphoric mood.       Objective:   Physical Exam  Constitutional: He appears well-developed and well-nourished.  HENT:  Mouth/Throat: No oropharyngeal exudate.  Eyes: EOM are normal. Pupils are equal, round, and reactive to light.  Neck: Neck supple.  Cardiovascular: Normal rate, regular rhythm and normal heart sounds.   Pulmonary/Chest: Effort normal and breath sounds normal.  Abdominal: Soft. Bowel sounds are normal. There is no tenderness.  Lymphadenopathy:    He has no cervical adenopathy.  Psychiatric: He has a normal mood and affect. Thought content normal.          Assessment & Plan:

## 2012-12-15 NOTE — Assessment & Plan Note (Addendum)
Will give trial of celexa, pt willing to try. Denies SI ("if i did i would just quit taking my pills")

## 2012-12-15 NOTE — Assessment & Plan Note (Signed)
Off hormones while awaiting CV w/u

## 2012-12-17 ENCOUNTER — Encounter: Payer: Self-pay | Admitting: Infectious Diseases

## 2012-12-18 ENCOUNTER — Ambulatory Visit: Payer: No Typology Code available for payment source

## 2012-12-19 ENCOUNTER — Ambulatory Visit (INDEPENDENT_AMBULATORY_CARE_PROVIDER_SITE_OTHER): Payer: No Typology Code available for payment source | Admitting: Cardiology

## 2012-12-19 ENCOUNTER — Encounter: Payer: Self-pay | Admitting: Cardiology

## 2012-12-19 VITALS — BP 110/73 | HR 69 | Ht 75.0 in | Wt 167.8 lb

## 2012-12-19 DIAGNOSIS — R072 Precordial pain: Secondary | ICD-10-CM | POA: Insufficient documentation

## 2012-12-19 NOTE — Patient Instructions (Addendum)
Your physician has requested that you have an exercise tolerance test. For further information please visit [email or web address]. Please also follow instruction sheet, as given.  Your physician recommends that you continue on your current medications as directed. Please refer to the Current Medication list given to you today.  

## 2012-12-19 NOTE — Progress Notes (Addendum)
HPI The patient presents for evaluation of chest discomfort. He has no prior cardiac history. He has for some time and developing chest discomfort that is now increasing in frequency and severity. He is happening about every other day. It comes on sporadically. He develops a 6/10 sharp mid sternal discomfort. He has tingling in his left hand. This may last for 20 minutes. He cannot bring this on. He may have some worsening discomfort with deep breathing. He is sedentary and hasn't noticed any activity that will make this occur. He's had no associated nausea vomiting or diaphoresis. The last episode was associated with shortness of breath. He's not describing PND or orthopnea. He does have some palpitations but no presyncope or syncope. He's had an intentional weight loss and no edema. He's had no prior cardiac testing though he had a murmur as a child.   No Known Allergies  Current Outpatient Prescriptions  Medication Sig Dispense Refill  . ALPRAZolam (XANAX) 1 MG tablet Take 1 tablet (1 mg total) by mouth every 8 (eight) hours as needed for sleep.  60 tablet  0  . efavirenz-emtricitabine-tenofovir (ATRIPLA) 600-200-300 MG per tablet Take 1 tablet by mouth at bedtime.  30 tablet  6  . estradiol (ESTRACE) 2 MG tablet Take 3 tablets (6 mg total) by mouth daily.  90 tablet  11  . finasteride (PROSCAR) 5 MG tablet Take 5 mg by mouth daily.      . medroxyPROGESTERone (PROVERA) 5 MG tablet Use as directed  90 tablet  3  . spironolactone (ALDACTONE) 25 MG tablet Take 2 tablets (50 mg total) by mouth 2 (two) times daily.  120 tablet  11  . [CANCELED] ALPRAZolam (XANAX) 1 MG tablet TAKE ONE TABLET BY MOUTH EVERY 8 HOURS AS NEEDED  60 tablet  2  . citalopram (CELEXA) 10 MG tablet Take 1 tablet (10 mg total) by mouth daily.  30 tablet  3   No current facility-administered medications for this visit.    Past Medical History  Diagnosis Date  . HIV infection     Past Surgical History  Procedure  Laterality Date  . Skin graft      Family History  Problem Relation Age of Onset  . Hypertension Maternal Grandmother     History   Social History  . Marital Status: Single    Spouse Name: N/A    Number of Children: N/A  . Years of Education: N/A   Occupational History  . Not on file.   Social History Main Topics  . Smoking status: Never Smoker   . Smokeless tobacco: Never Used  . Alcohol Use: 0.5 oz/week    1 drink(s) per week     Comment: rarely  . Drug Use: No  . Sexually Active: Yes -- Female partner(s)     Comment: accepted condoms   Other Topics Concern  . Not on file   Social History Narrative   Lives alone.     ROS:  Positive headache, dizziness, GERD, urinary frequency.  Otherwise as stated in the HPI and negative for all other systems.  PHYSICAL EXAM BP 110/73  Pulse 69  Ht 6\' 3"  (1.905 m)  Wt 167 lb 12.8 oz (76.114 kg)  BMI 20.97 kg/m2 GENERAL:  Well appearing HEENT:  Pupils equal round and reactive, fundi not visualized, oral mucosa unremarkable NECK:  No jugular venous distention, waveform within normal limits, carotid upstroke brisk and symmetric, no bruits, no thyromegaly LYMPHATICS:  No cervical, inguinal adenopathy LUNGS:  Clear to auscultation bilaterally BACK:  No CVA tenderness CHEST:  Unremarkable HEART:  PMI not displaced or sustained,S1 and S2 within normal limits, no S3, no S4, no clicks, no rubs, no murmurs ABD:  Flat, positive bowel sounds normal in frequency in pitch, no bruits, no rebound, no guarding, no midline pulsatile mass, no hepatomegaly, no splenomegaly EXT:  2 plus pulses throughout, no edema, no cyanosis no clubbing SKIN:  No rashes firm 3 cm by 2 cm nodule left anterior abdominal wall subcutaneous, non tender. NEURO:  Cranial nerves II through XII grossly intact, motor grossly intact throughout PSYCH:  Cognitively intact, oriented to person place and time   EKG:  Sinus rhythm, rate 57, axis within normal limits,  intervals within normal limits, no acute ST-T wave changes. 12/19/2012  ASSESSMENT AND PLAN  Chest pain:  The pain is somewhat atypical. I think the pretest probability of obstructive coronary disease is relatively low. Screening with an exercise treadmill test would be indicated. I will bring the patient back for a POET (Plain Old Exercise Test). This will allow me to screen for obstructive coronary disease, risk stratify and very importantly provide a prescription for exercise.  He has no exercise regimen and this should be correct.  Subcutaneous nodule:  He has a firm nodule on his left anterior torso.  I have suggested he discuss this further with Dr. Ninetta Lights.

## 2012-12-23 ENCOUNTER — Ambulatory Visit: Payer: No Typology Code available for payment source | Admitting: *Deleted

## 2012-12-28 ENCOUNTER — Other Ambulatory Visit: Payer: Self-pay | Admitting: Adult Health

## 2012-12-28 DIAGNOSIS — B20 Human immunodeficiency virus [HIV] disease: Secondary | ICD-10-CM

## 2012-12-29 ENCOUNTER — Other Ambulatory Visit: Payer: Self-pay | Admitting: *Deleted

## 2012-12-29 ENCOUNTER — Institutional Professional Consult (permissible substitution): Payer: No Typology Code available for payment source | Admitting: Cardiology

## 2012-12-29 DIAGNOSIS — E349 Endocrine disorder, unspecified: Secondary | ICD-10-CM

## 2012-12-29 MED ORDER — MEDROXYPROGESTERONE ACETATE 5 MG PO TABS: ORAL_TABLET | ORAL | Status: DC

## 2013-01-06 ENCOUNTER — Encounter: Payer: Self-pay | Admitting: Nurse Practitioner

## 2013-01-06 ENCOUNTER — Ambulatory Visit (INDEPENDENT_AMBULATORY_CARE_PROVIDER_SITE_OTHER): Payer: No Typology Code available for payment source | Admitting: Nurse Practitioner

## 2013-01-06 VITALS — BP 113/71 | HR 86

## 2013-01-06 DIAGNOSIS — R072 Precordial pain: Secondary | ICD-10-CM

## 2013-01-06 DIAGNOSIS — R0989 Other specified symptoms and signs involving the circulatory and respiratory systems: Secondary | ICD-10-CM

## 2013-01-06 NOTE — Progress Notes (Signed)
Exercise Treadmill Test  Pre-Exercise Testing Evaluation Rhythm: normal sinus  Rate: 60                 Test  Exercise Tolerance Test Ordering MD: Angelina Sheriff, MD  Interpreting MD: Norma Fredrickson, NP  Unique Test No: 1  Treadmill:  1  Indication for ETT: chest pain - rule out ischemia  Contraindication to ETT: No   Stress Modality: exercise - treadmill  Cardiac Imaging Performed: non   Protocol: standard Bruce - maximal  Max BP:  162/64  Max MPHR (bpm):  192 85% MPR (bpm):  163  MPHR obtained (bpm):  187 % MPHR obtained:  97%  Reached 85% MPHR (min:sec):  7:30 Total Exercise Time (min-sec):  10 min  Workload in METS:  11.7 Borg Scale: 13  Reason ETT Terminated:  desired heart rate attained    ST Segment Analysis At Rest: normal ST segments - no evidence of significant ST depression With Exercise: no evidence of significant ST depression  Other Information Arrhythmia:  Yes Angina during ETT:  absent (0) Quality of ETT:  diagnostic  ETT Interpretation:  normal - no evidence of ischemia by ST analysis  Comments: Patient presents today for routine GXT. Has had atypical chest pain. GXT to rule out ischemic heart disease.   Today, he exercised on the standard Bruce protocol for a total of 10 minutes. He has good exercise tolerance. Adequate blood pressure response. Clinically negative for angina. Test stopped due to target heart rate achieved. EKG negative for ischemia. Incomplete RBBB noted. Occasional PVCs noted versus artifact. No significant arrhythmia noted. Test is felt to be negative.   Recommendations: Reassurance CV risk factor modification  See back prn.  Patient is agreeable to this plan and will call if any problems develop in the interim.   Rosalio Macadamia, RN, ANP-C  [facility] [address] [place], [place]  [address]

## 2013-01-27 ENCOUNTER — Other Ambulatory Visit: Payer: Self-pay | Admitting: *Deleted

## 2013-01-27 DIAGNOSIS — B20 Human immunodeficiency virus [HIV] disease: Secondary | ICD-10-CM

## 2013-01-27 MED ORDER — EFAVIRENZ-EMTRICITAB-TENOFOVIR 600-200-300 MG PO TABS: 1.0000 | ORAL_TABLET | Freq: Every day | ORAL | Status: DC

## 2013-01-28 ENCOUNTER — Encounter: Payer: Self-pay | Admitting: Infectious Diseases

## 2013-01-28 ENCOUNTER — Ambulatory Visit (INDEPENDENT_AMBULATORY_CARE_PROVIDER_SITE_OTHER): Payer: No Typology Code available for payment source | Admitting: Infectious Diseases

## 2013-01-28 VITALS — BP 115/73 | HR 71 | Temp 98.3°F | Ht 75.0 in | Wt 160.0 lb

## 2013-01-28 DIAGNOSIS — F3289 Other specified depressive episodes: Secondary | ICD-10-CM

## 2013-01-28 DIAGNOSIS — J309 Allergic rhinitis, unspecified: Secondary | ICD-10-CM

## 2013-01-28 DIAGNOSIS — B20 Human immunodeficiency virus [HIV] disease: Secondary | ICD-10-CM

## 2013-01-28 DIAGNOSIS — R079 Chest pain, unspecified: Secondary | ICD-10-CM

## 2013-01-28 DIAGNOSIS — F329 Major depressive disorder, single episode, unspecified: Secondary | ICD-10-CM

## 2013-01-28 NOTE — Assessment & Plan Note (Signed)
Doing well. Will cont his current rx. See back in 3-4 months.

## 2013-01-28 NOTE — Assessment & Plan Note (Signed)
Resolved with celexa. My appreciation to CV. Will mark as resolved.

## 2013-01-28 NOTE — Assessment & Plan Note (Signed)
Encouraged pt to take otc zyrtec or claritin until pollen subsides. He has taken benadryl with some relief. rtc 3 months

## 2013-01-28 NOTE — Progress Notes (Signed)
  Subjective:    Patient ID: Renee Malone, female    DOB: 10-07-1986, 27 y.o.   MRN: [id number]  HPI 27 yo M --> F with Dx HIV+ 06-17-09. Has been on atripla as only HIV medication. Has been taking estrogen/provera as well.   . Was last seen (12-15-12) and had atypical CP- underwent GXT (-). More recently has sinus infection- cough, nasal drainage, lost voice, sore throat. No fever, chills. Felt hot on the outside, cold in the inside.  Panic attacks better since on celexa. More managable.  Restarted hormone therapy once CV w/u (-).   Review of Systems     Objective:   Physical Exam  Constitutional: He appears well-developed and well-nourished.  HENT:  Nose: Right sinus exhibits no maxillary sinus tenderness and no frontal sinus tenderness. Left sinus exhibits no maxillary sinus tenderness and no frontal sinus tenderness.  Mouth/Throat: No oropharyngeal exudate.  Eyes: EOM are normal. Pupils are equal, round, and reactive to light.  Neck: Neck supple.  Cardiovascular: Normal rate, regular rhythm and normal heart sounds.   Pulmonary/Chest: Effort normal and breath sounds normal.  Abdominal: Soft. Bowel sounds are normal. There is no tenderness.  Lymphadenopathy:    He has no cervical adenopathy.  Psychiatric: He has a normal mood and affect.          Assessment & Plan:

## 2013-01-28 NOTE — Assessment & Plan Note (Signed)
Better with celexa. Will continue to watch.

## 2013-02-10 ENCOUNTER — Ambulatory Visit: Payer: No Typology Code available for payment source

## 2013-02-10 DIAGNOSIS — F4310 Post-traumatic stress disorder, unspecified: Secondary | ICD-10-CM

## 2013-02-10 DIAGNOSIS — F331 Major depressive disorder, recurrent, moderate: Secondary | ICD-10-CM

## 2013-02-10 NOTE — Progress Notes (Signed)
"  Renee Malone" reports that she is very frustrated lately, due, in part, to going through a recent move that is still not complete - she has some things still at her old place.  She also feels like her sense of self-image is not in line with other people's perception of her.  She says the Celexa is helping her panic symptoms, but not helping her depression.  She rated her mood at 1-2 on a 10 pt scale, but she reports no suicidal ideation.  She reports isolating, poor sleep, poor concentration.  She says her mother has gone back on drugs and this is very frustrating to her.  Plan to meet again in 2 weeks.

## 2013-02-26 ENCOUNTER — Ambulatory Visit: Payer: No Typology Code available for payment source

## 2013-04-29 ENCOUNTER — Other Ambulatory Visit (INDEPENDENT_AMBULATORY_CARE_PROVIDER_SITE_OTHER): Payer: No Typology Code available for payment source

## 2013-04-29 DIAGNOSIS — B20 Human immunodeficiency virus [HIV] disease: Secondary | ICD-10-CM

## 2013-04-29 LAB — CBC WITH DIFFERENTIAL/PLATELET
Basophils Absolute: 0 10*3/uL (ref 0.0–0.1)
Basophils Relative: 0 % (ref 0–1)
Eosinophils Absolute: 0.1 10*3/uL (ref 0.0–0.7)
MCHC: 34 g/dL (ref 30.0–36.0)
Neutro Abs: 2.8 10*3/uL (ref 1.7–7.7)
Neutrophils Relative %: 54 % (ref 43–77)
RDW: 13.2 % (ref 11.5–15.5)

## 2013-04-29 LAB — COMPREHENSIVE METABOLIC PANEL
ALT: 15 U/L (ref 0–53)
BUN: 15 mg/dL (ref 6–23)
CO2: 28 mEq/L (ref 19–32)
Creat: 1.1 mg/dL (ref 0.50–1.35)
Total Bilirubin: 0.3 mg/dL (ref 0.3–1.2)

## 2013-04-30 LAB — HIV-1 RNA QUANT-NO REFLEX-BLD
HIV 1 RNA Quant: <20 copies/mL (ref <20)
HIV-1 RNA Quant, Log: <1.3 {Log} (ref <1.30)

## 2013-04-30 LAB — T-HELPER CELL (CD4) - (RCID CLINIC ONLY): CD4 T Cell Abs: 790 uL (ref 400–2700)

## 2013-05-11 ENCOUNTER — Other Ambulatory Visit: Payer: Self-pay | Admitting: *Deleted

## 2013-05-11 ENCOUNTER — Telehealth: Payer: Self-pay | Admitting: *Deleted

## 2013-05-11 ENCOUNTER — Other Ambulatory Visit (INDEPENDENT_AMBULATORY_CARE_PROVIDER_SITE_OTHER): Payer: No Typology Code available for payment source

## 2013-05-11 DIAGNOSIS — Z113 Encounter for screening for infections with a predominantly sexual mode of transmission: Secondary | ICD-10-CM

## 2013-05-11 LAB — RPR

## 2013-05-11 NOTE — Telephone Encounter (Signed)
Pt called requesting lab appointment. States his partner has been diagnosed with chlamydia and gonorrhea. Last unprotected encounter was 3 weeks ago, pt notes no symptoms.  Pt given appointment today.  Pt has f/u appointment with Dr. Ninetta Lights 8/6. Andree Coss, RN

## 2013-05-13 ENCOUNTER — Encounter: Payer: Self-pay | Admitting: Infectious Diseases

## 2013-05-13 ENCOUNTER — Ambulatory Visit (INDEPENDENT_AMBULATORY_CARE_PROVIDER_SITE_OTHER): Payer: No Typology Code available for payment source | Admitting: Infectious Diseases

## 2013-05-13 ENCOUNTER — Ambulatory Visit: Payer: No Typology Code available for payment source

## 2013-05-13 VITALS — BP 110/70 | HR 81 | Temp 98.6°F | Ht 75.0 in | Wt 160.0 lb

## 2013-05-13 DIAGNOSIS — Z79899 Other long term (current) drug therapy: Secondary | ICD-10-CM

## 2013-05-13 DIAGNOSIS — B20 Human immunodeficiency virus [HIV] disease: Secondary | ICD-10-CM

## 2013-05-13 DIAGNOSIS — Z113 Encounter for screening for infections with a predominantly sexual mode of transmission: Secondary | ICD-10-CM

## 2013-05-13 MED ORDER — AZITHROMYCIN 500 MG PO TABS: 1000.0000 mg | ORAL_TABLET | Freq: Every day | ORAL | Status: DC

## 2013-05-13 NOTE — Progress Notes (Signed)
  Subjective:    Patient ID: Renee Malone, female    DOB: June 17, 1986, 27 y.o.   MRN: [id number]  HPI 27 yo M --> F with Dx HIV+ 06-17-09. Has been on atripla as only HIV medication. Has been taking estrogen/provera as well.  Had STD screening 2 days ago, all negative. His partner tested chlamydia+. Pt still wants to be tested.   HIV 1 RNA Quant (copies/mL)  Date Value  04/29/2013 <20   11/19/2012 <20   04/14/2012 <20      CD4 T Cell Abs (cmm)  Date Value  04/29/2013 790   11/19/2012 780   04/14/2012 870       Review of Systems  Constitutional: Negative for appetite change and unexpected weight change.  Gastrointestinal: Negative for diarrhea and constipation.  Genitourinary: Negative for dysuria and frequency.       Objective:   Physical Exam  Constitutional: He appears well-developed and well-nourished.  HENT:  Mouth/Throat: No oropharyngeal exudate.  Eyes: EOM are normal. Pupils are equal, round, and reactive to light.  Neck: Neck supple.  Cardiovascular: Normal rate, regular rhythm and normal heart sounds.   Pulmonary/Chest: Effort normal and breath sounds normal.  Abdominal: Soft. Bowel sounds are normal. There is no tenderness. There is no rebound.  Lymphadenopathy:    He has no cervical adenopathy.          Assessment & Plan:

## 2013-05-13 NOTE — Assessment & Plan Note (Signed)
Doing well. He has disclosed to his partner. His partner does not want to use condoms (states he had test - in the last week).  Will give him one time dose of azithro for chlamydia exposure. rtc 6 months.

## 2013-06-16 ENCOUNTER — Ambulatory Visit (INDEPENDENT_AMBULATORY_CARE_PROVIDER_SITE_OTHER): Payer: Self-pay

## 2013-06-16 DIAGNOSIS — Z23 Encounter for immunization: Secondary | ICD-10-CM

## 2013-06-16 DIAGNOSIS — B20 Human immunodeficiency virus [HIV] disease: Secondary | ICD-10-CM

## 2013-06-19 ENCOUNTER — Encounter: Payer: Self-pay | Admitting: Infectious Diseases

## 2013-08-04 ENCOUNTER — Other Ambulatory Visit: Payer: Self-pay | Admitting: Infectious Diseases

## 2013-08-04 ENCOUNTER — Other Ambulatory Visit: Payer: Self-pay | Admitting: *Deleted

## 2013-08-04 DIAGNOSIS — E349 Endocrine disorder, unspecified: Secondary | ICD-10-CM

## 2013-08-04 DIAGNOSIS — F329 Major depressive disorder, single episode, unspecified: Secondary | ICD-10-CM

## 2013-08-04 MED ORDER — CITALOPRAM HYDROBROMIDE 10 MG PO TABS: 10.0000 mg | ORAL_TABLET | Freq: Every day | ORAL | Status: DC

## 2013-08-13 ENCOUNTER — Other Ambulatory Visit: Payer: Self-pay | Admitting: Infectious Diseases

## 2013-08-14 ENCOUNTER — Other Ambulatory Visit: Payer: Self-pay | Admitting: *Deleted

## 2013-08-14 DIAGNOSIS — B20 Human immunodeficiency virus [HIV] disease: Secondary | ICD-10-CM

## 2013-08-14 MED ORDER — FINASTERIDE 5 MG PO TABS: 5.0000 mg | ORAL_TABLET | Freq: Every day | ORAL | Status: DC

## 2013-09-04 ENCOUNTER — Other Ambulatory Visit: Payer: Self-pay | Admitting: Internal Medicine

## 2013-10-07 ENCOUNTER — Other Ambulatory Visit: Payer: Self-pay | Admitting: Internal Medicine

## 2013-10-07 DIAGNOSIS — B20 Human immunodeficiency virus [HIV] disease: Secondary | ICD-10-CM

## 2013-11-10 ENCOUNTER — Ambulatory Visit (INDEPENDENT_AMBULATORY_CARE_PROVIDER_SITE_OTHER): Payer: 59 | Admitting: Infectious Diseases

## 2013-11-10 ENCOUNTER — Other Ambulatory Visit: Payer: 59

## 2013-11-10 ENCOUNTER — Telehealth: Payer: Self-pay | Admitting: *Deleted

## 2013-11-10 ENCOUNTER — Encounter: Payer: Self-pay | Admitting: Infectious Diseases

## 2013-11-10 VITALS — BP 118/79 | HR 67 | Temp 98.1°F | Ht 75.0 in | Wt 166.0 lb

## 2013-11-10 DIAGNOSIS — F640 Transsexualism: Secondary | ICD-10-CM

## 2013-11-10 DIAGNOSIS — IMO0001 Reserved for inherently not codable concepts without codable children: Secondary | ICD-10-CM

## 2013-11-10 DIAGNOSIS — Z113 Encounter for screening for infections with a predominantly sexual mode of transmission: Secondary | ICD-10-CM

## 2013-11-10 DIAGNOSIS — Z79899 Other long term (current) drug therapy: Secondary | ICD-10-CM

## 2013-11-10 DIAGNOSIS — E349 Endocrine disorder, unspecified: Secondary | ICD-10-CM

## 2013-11-10 DIAGNOSIS — B20 Human immunodeficiency virus [HIV] disease: Secondary | ICD-10-CM

## 2013-11-10 LAB — CBC
HCT: 42.1 % (ref 39.0–52.0)
Hemoglobin: 14.6 g/dL (ref 13.0–17.0)
MCH: 31.2 pg (ref 26.0–34.0)
MCHC: 34.7 g/dL (ref 30.0–36.0)
MCV: 90 fL (ref 78.0–100.0)
Platelets: 289 10*3/uL (ref 150–400)
RBC: 4.68 MIL/uL (ref 4.22–5.81)
RDW: 13.5 % (ref 11.5–15.5)
WBC: 6.2 10*3/uL (ref 4.0–10.5)

## 2013-11-10 MED ORDER — ESTRADIOL VALERATE 10 MG/ML IM OIL: 10.0000 mg | TOPICAL_OIL | INTRAMUSCULAR | Status: DC

## 2013-11-10 MED ORDER — SPIRONOLACTONE 25 MG PO TABS: 50.0000 mg | ORAL_TABLET | Freq: Two times a day (BID) | ORAL | Status: DC

## 2013-11-10 NOTE — Progress Notes (Signed)
   Subjective:    Patient ID: Renee SilvasChristopher Malone, female    DOB: 04/12/1986, 28 y.o.   MRN: [id number]  HPI 28 yo M --> F with Dx HIV+ 06-17-09. Has been on atripla as only HIV medication. Has been taking estrogen/provera as well.  Has been feeling well. Interested in changing meds- taking occas vacation from hormones. Wondering about depo shots. Wanting larger breasts. Has gained wt.  Missed 1 dose of meds recently.   HIV 1 RNA Quant (copies/mL)  Date Value  04/29/2013 <20   11/19/2012 <20   04/14/2012 <20      CD4 T Cell Abs (cmm)  Date Value  04/29/2013 790   11/19/2012 780   04/14/2012 870     Review of Systems  Constitutional: Negative for appetite change and unexpected weight change.  Respiratory: Negative for shortness of breath.   Gastrointestinal: Negative for diarrhea and constipation.  Genitourinary: Negative for difficulty urinating.  Neurological: Negative for headaches.       Objective:   Physical Exam  Constitutional: He appears well-developed and well-nourished.  HENT:  Mouth/Throat: No oropharyngeal exudate.  Eyes: EOM are normal. Pupils are equal, round, and reactive to light.  Neck: Neck supple.  Cardiovascular: Normal rate, regular rhythm and normal heart sounds.   Pulmonary/Chest: Effort normal and breath sounds normal.  Abdominal: Soft. There is no tenderness.  Lymphadenopathy:    He has no cervical adenopathy.          Assessment & Plan:

## 2013-11-10 NOTE — Telephone Encounter (Signed)
Patient called and advised that her Rx for injectable hormone has been denied by her insurance company. They advised they will approve a patch however and she was wondering if Dr Ninetta LightsHatcher will change the Rx to patches. Advised the patient will ask and give her a call back as soon as I get an answer. She advised that we can leave a message because she can not answer her phone at work.

## 2013-11-10 NOTE — Assessment & Plan Note (Addendum)
Will change pt to injections to see if result improves. Stop provera. Resume spironolactone. Back to clinic for BMP in 2 weeks.

## 2013-11-10 NOTE — Addendum Note (Signed)
Addended by: Andree CossHOWELL, Tinzlee Craker M on: 11/10/2013 02:28 PM   Modules accepted: Orders, Medications

## 2013-11-10 NOTE — Assessment & Plan Note (Signed)
Doing well. Does not need condoms. Partner gets tested regularly. Needs PNVX later this year. Will see back in 6 months.

## 2013-11-11 ENCOUNTER — Telehealth: Payer: Self-pay

## 2013-11-11 ENCOUNTER — Other Ambulatory Visit: Payer: Self-pay | Admitting: Infectious Diseases

## 2013-11-11 DIAGNOSIS — F640 Transsexualism: Principal | ICD-10-CM

## 2013-11-11 DIAGNOSIS — E349 Endocrine disorder, unspecified: Secondary | ICD-10-CM

## 2013-11-11 DIAGNOSIS — IMO0001 Reserved for inherently not codable concepts without codable children: Secondary | ICD-10-CM

## 2013-11-11 LAB — COMPLETE METABOLIC PANEL WITH GFR
ALBUMIN: 4.1 g/dL (ref 3.5–5.2)
ALT: 17 U/L (ref 0–53)
AST: 21 U/L (ref 0–37)
Alkaline Phosphatase: 60 U/L (ref 39–117)
BUN: 9 mg/dL (ref 6–23)
CALCIUM: 9.7 mg/dL (ref 8.4–10.5)
CO2: 33 meq/L — AB (ref 19–32)
CREATININE: 0.98 mg/dL (ref 0.50–1.35)
Chloride: 101 mEq/L (ref 96–112)
GLUCOSE: 85 mg/dL (ref 70–99)
POTASSIUM: 4.2 meq/L (ref 3.5–5.3)
Sodium: 140 mEq/L (ref 135–145)
Total Bilirubin: 0.3 mg/dL (ref 0.2–1.2)
Total Protein: 7.2 g/dL (ref 6.0–8.3)

## 2013-11-11 LAB — LIPID PANEL
Cholesterol: 124 mg/dL (ref 0–200)
HDL: 50 mg/dL (ref >39)
LDL CALC: 57 mg/dL (ref 0–99)
Total CHOL/HDL Ratio: 2.5 Ratio
Triglycerides: 84 mg/dL (ref <150)
VLDL: 17 mg/dL (ref 0–40)

## 2013-11-11 LAB — RPR

## 2013-11-11 LAB — T-HELPER CELL (CD4) - (RCID CLINIC ONLY)
CD4 % Helper T Cell: 46 % (ref 33–55)
CD4 T Cell Abs: 1000 /uL (ref 400–2700)

## 2013-11-11 MED ORDER — ESTRADIOL 0.025 MG/24HR TD PTTW: 1.0000 | MEDICATED_PATCH | TRANSDERMAL | Status: DC

## 2013-11-11 NOTE — Telephone Encounter (Signed)
Dr. Ninetta LightsHatcher changed the Rx to patches, it was sent to pharmacy and patient notified. Wendall MolaJacqueline Cockerham

## 2013-11-11 NOTE — Telephone Encounter (Signed)
Insurance will pay for climara patch.  Is it ok to change the medication.   Per Dr Ninetta LightsHatcher okay for climara 0.025mg  apply once weekly with 5 RF.  Medication added to list and sent to pharmacy .

## 2013-11-11 NOTE — Telephone Encounter (Signed)
refill 

## 2013-11-12 LAB — HIV-1 RNA QUANT-NO REFLEX-BLD: HIV-1 RNA Quant, Log: <1.3 {Log} (ref <1.30)

## 2013-11-16 ENCOUNTER — Other Ambulatory Visit: Payer: Self-pay | Admitting: Licensed Clinical Social Worker

## 2013-11-16 ENCOUNTER — Other Ambulatory Visit: Payer: 59

## 2013-11-16 ENCOUNTER — Telehealth: Payer: Self-pay | Admitting: Licensed Clinical Social Worker

## 2013-11-16 DIAGNOSIS — Z113 Encounter for screening for infections with a predominantly sexual mode of transmission: Secondary | ICD-10-CM

## 2013-11-16 NOTE — Telephone Encounter (Signed)
Patient  called stating his partner tested positive for Gonorrhea and he wanted to be tested, he currently denies symptoms. Patient scheduled to come in today for labs.

## 2013-11-18 ENCOUNTER — Telehealth: Payer: Self-pay | Admitting: Licensed Clinical Social Worker

## 2013-11-18 DIAGNOSIS — Z202 Contact with and (suspected) exposure to infections with a predominantly sexual mode of transmission: Secondary | ICD-10-CM

## 2013-11-18 NOTE — Telephone Encounter (Signed)
Patient called for results of her urine test, her GC/Chlamydia test was negative. Patient wants to be treated anyway for Gonorrhea because her partner tested positive and she is afraid that she may have it in her rectum. Patient denies any symptoms. Please advise

## 2013-11-20 NOTE — Telephone Encounter (Signed)
Patient concerned about obtaining treatment for GC related to anal intercourse with known GC patient.  MD please advise.

## 2013-11-23 ENCOUNTER — Ambulatory Visit: Payer: 59 | Admitting: Infectious Diseases

## 2013-11-23 NOTE — Telephone Encounter (Signed)
RN advised pt about the need to obtain an anal probe to test for the presence GC.  Pt verbalized understanding.  Will come tomorrow for testing.

## 2013-11-23 NOTE — Addendum Note (Signed)
Addended by: Jennet MaduroESTRIDGE, Gary Bultman D on: 11/23/2013 03:47 PM   Modules accepted: Orders

## 2013-11-24 ENCOUNTER — Ambulatory Visit: Payer: Self-pay | Admitting: Infectious Diseases

## 2013-11-24 ENCOUNTER — Other Ambulatory Visit: Payer: 59

## 2013-11-25 ENCOUNTER — Other Ambulatory Visit: Payer: 59

## 2013-11-25 ENCOUNTER — Ambulatory Visit (INDEPENDENT_AMBULATORY_CARE_PROVIDER_SITE_OTHER): Payer: 59 | Admitting: Infectious Diseases

## 2013-11-25 DIAGNOSIS — Z113 Encounter for screening for infections with a predominantly sexual mode of transmission: Secondary | ICD-10-CM

## 2013-11-25 DIAGNOSIS — B20 Human immunodeficiency virus [HIV] disease: Secondary | ICD-10-CM

## 2013-11-25 MED ORDER — AZITHROMYCIN 250 MG PO TABS
1000.0000 mg | ORAL_TABLET | Freq: Once | ORAL | Status: AC
  Administered 2013-11-25: 1000 mg via ORAL

## 2013-11-25 MED ORDER — AZITHROMYCIN 500 MG PO TABS: 1000.0000 mg | ORAL_TABLET | Freq: Once | ORAL | Status: DC

## 2013-11-25 MED ORDER — CEFTRIAXONE SODIUM 250 MG IJ SOLR: 250.0000 mg | Freq: Once | INTRAMUSCULAR | Status: DC

## 2013-11-25 MED ORDER — CEFTRIAXONE SODIUM 1 G IJ SOLR
250.0000 mg | Freq: Once | INTRAMUSCULAR | Status: AC
  Administered 2013-11-25: 250 mg via INTRAMUSCULAR

## 2013-11-25 NOTE — Addendum Note (Signed)
Addended by: Andree CossHOWELL, Quamir Willemsen M on: 11/25/2013 04:22 PM   Modules accepted: Orders

## 2013-11-25 NOTE — Assessment & Plan Note (Signed)
Will check anal pap and check for rectal gc/chlamydia swab.  Give him empiric/contact rx for gc.

## 2013-11-25 NOTE — Progress Notes (Signed)
   Subjective:    Patient ID: Renee SilvasChristopher Malone, female    DOB: 03/20/1986, 28 y.o.   MRN: [id number]  HPI 28 yo M --> F with Dx HIV+ 06-17-09. Has been on atripla as only HIV medication. On estrogen injections, spironolactone.  Here today for STD screen- had urine gc/chlamydia (-) 11-16-13.  No d/c, no lesions, sores. His partner recently tested+ for GC at his PCP.    Review of Systems     Objective:   Physical Exam  Constitutional: He appears well-developed and well-nourished.  Genitourinary:             Assessment & Plan:

## 2013-11-25 NOTE — Addendum Note (Signed)
Addended by: Mariea ClontsGREEN, Jamiracle Avants D on: 11/25/2013 04:31 PM   Modules accepted: Orders

## 2013-11-25 NOTE — Addendum Note (Signed)
Addended by: Analeah Brame C on: 11/25/2013 03:52 PM   Modules accepted: Orders

## 2013-11-26 ENCOUNTER — Telehealth: Payer: Self-pay | Admitting: *Deleted

## 2013-11-26 LAB — CERVICOVAGINAL ANCILLARY ONLY
Chlamydia: NEGATIVE
NEISSERIA GONORRHEA: POSITIVE — AB

## 2013-11-26 NOTE — Telephone Encounter (Signed)
Patient called for his CG/NG test results.  RN relayed results, treatment was given on day of testing.  Pt will abstain from sexual contact for 7 days. Andree CossHowell, Janeth Terry M, RN

## 2013-12-02 ENCOUNTER — Telehealth: Payer: Self-pay | Admitting: *Deleted

## 2013-12-02 NOTE — Telephone Encounter (Signed)
Patient called stating that she feels her partner was not adequately treated for an STD and would like him seen here. Explained that because her partner is not HIV positive [facility] would [facility], phone number provided. [name]

## 2014-02-10 ENCOUNTER — Ambulatory Visit: Payer: 59 | Admitting: Infectious Diseases

## 2014-04-06 ENCOUNTER — Other Ambulatory Visit: Payer: Self-pay | Admitting: Infectious Diseases

## 2014-07-07 ENCOUNTER — Other Ambulatory Visit: Payer: Self-pay | Admitting: Infectious Diseases

## 2014-07-22 ENCOUNTER — Other Ambulatory Visit: Payer: 59

## 2014-07-23 ENCOUNTER — Other Ambulatory Visit: Payer: Self-pay

## 2014-08-05 ENCOUNTER — Ambulatory Visit: Payer: 59 | Admitting: Infectious Diseases

## 2014-08-11 ENCOUNTER — Ambulatory Visit (INDEPENDENT_AMBULATORY_CARE_PROVIDER_SITE_OTHER): Payer: 59 | Admitting: Infectious Diseases

## 2014-08-11 ENCOUNTER — Encounter: Payer: Self-pay | Admitting: Infectious Diseases

## 2014-08-11 VITALS — BP 113/72 | HR 63 | Temp 98.3°F | Wt 160.0 lb

## 2014-08-11 DIAGNOSIS — B20 Human immunodeficiency virus [HIV] disease: Secondary | ICD-10-CM

## 2014-08-11 DIAGNOSIS — E349 Endocrine disorder, unspecified: Secondary | ICD-10-CM

## 2014-08-11 DIAGNOSIS — F640 Transsexualism: Secondary | ICD-10-CM

## 2014-08-11 DIAGNOSIS — Z113 Encounter for screening for infections with a predominantly sexual mode of transmission: Secondary | ICD-10-CM

## 2014-08-11 DIAGNOSIS — IMO0001 Reserved for inherently not codable concepts without codable children: Secondary | ICD-10-CM

## 2014-08-11 DIAGNOSIS — F641 Gender identity disorder in adolescence and adulthood: Secondary | ICD-10-CM

## 2014-08-11 LAB — COMPREHENSIVE METABOLIC PANEL
ALK PHOS: 51 U/L (ref 39–117)
ALT: 16 U/L (ref 0–53)
AST: 21 U/L (ref 0–37)
Albumin: 4.6 g/dL (ref 3.5–5.2)
BUN: 12 mg/dL (ref 6–23)
CO2: 31 meq/L (ref 19–32)
Calcium: 9.1 mg/dL (ref 8.4–10.5)
Chloride: 104 mEq/L (ref 96–112)
Creat: 1.12 mg/dL (ref 0.50–1.35)
GLUCOSE: 68 mg/dL — AB (ref 70–99)
POTASSIUM: 4 meq/L (ref 3.5–5.3)
SODIUM: 141 meq/L (ref 135–145)
Total Bilirubin: 0.2 mg/dL (ref 0.2–1.2)
Total Protein: 7 g/dL (ref 6.0–8.3)

## 2014-08-11 LAB — CBC
HEMATOCRIT: 41 % (ref 39.0–52.0)
Hemoglobin: 14.2 g/dL (ref 13.0–17.0)
MCH: 30.7 pg (ref 26.0–34.0)
MCHC: 34.6 g/dL (ref 30.0–36.0)
MCV: 88.7 fL (ref 78.0–100.0)
Platelets: 242 10*3/uL (ref 150–400)
RBC: 4.62 MIL/uL (ref 4.22–5.81)
RDW: 13.8 % (ref 11.5–15.5)
WBC: 5.7 10*3/uL (ref 4.0–10.5)

## 2014-08-11 MED ORDER — SPIRONOLACTONE 25 MG PO TABS: 25.0000 mg | ORAL_TABLET | Freq: Two times a day (BID) | ORAL | Status: DC

## 2014-08-11 MED ORDER — ESTRADIOL 0.025 MG/24HR TD PTWK: 0.0250 mg | MEDICATED_PATCH | TRANSDERMAL | Status: DC

## 2014-08-11 NOTE — Progress Notes (Signed)
   Subjective:    Patient ID: Renee SilvasChristopher Malone, female    DOB: 01/12/1986, 28 y.o.   MRN: [id number]  HPI  28 yo M --> F with Dx HIV+ 06-17-09. Has been on atripla as only HIV medication. On estrogen injections, spironolactone. Not sure that he has been right dose, would like bigger breasts.  Was seen this spring and had GC. Got ceftriaxone IM.  Has been struggling with "affordable" health care.  No problems with ART. Hormone therapy has been too expensive, was off for 2 months. Has had to chose between this and total insurance coverage.  Has gotten flu shot, pnvx this year.   HIV 1 RNA QUANT (copies/mL)  Date Value  11/10/2013 <20  04/29/2013 <20  11/19/2012 <20   CD4 T CELL ABS  Date Value  11/10/2013 1000 /uL  04/29/2013 790 cmm  11/19/2012 780 cmm    Review of Systems  Constitutional: Negative for appetite change and unexpected weight change.  Gastrointestinal: Negative for diarrhea and constipation.  Genitourinary: Negative for difficulty urinating.       Objective:   Physical Exam  Constitutional: He appears well-developed and well-nourished.  HENT:  Mouth/Throat: No oropharyngeal exudate.  Eyes: EOM are normal. Pupils are equal, round, and reactive to light.  Neck: Neck supple.  Cardiovascular: Normal rate, regular rhythm and normal heart sounds.   Pulmonary/Chest: Effort normal and breath sounds normal.  Abdominal: Soft. Bowel sounds are normal. There is no tenderness. There is no rebound.  Lymphadenopathy:    He has no cervical adenopathy.          Assessment & Plan:

## 2014-08-11 NOTE — Assessment & Plan Note (Signed)
Doing well, vax are up to date. Will check labs today. Will rtc in 6 months.

## 2014-08-11 NOTE — Assessment & Plan Note (Addendum)
Interested in plastic surgery, does not have the money. Defer  Mammogram for now. Readdress next year.

## 2014-08-12 LAB — T-HELPER CELL (CD4) - (RCID CLINIC ONLY)
CD4 % Helper T Cell: 45 % (ref 33–55)
CD4 T Cell Abs: 990 /uL (ref 400–2700)

## 2014-08-12 LAB — URINE CYTOLOGY ANCILLARY ONLY
Chlamydia: NEGATIVE
Neisseria Gonorrhea: NEGATIVE

## 2014-08-12 LAB — RPR

## 2014-08-14 LAB — HIV-1 RNA QUANT-NO REFLEX-BLD: HIV 1 RNA Quant: <20 copies/mL (ref <20)

## 2014-08-16 ENCOUNTER — Other Ambulatory Visit: Payer: Self-pay | Admitting: *Deleted

## 2014-08-16 MED ORDER — EFAVIRENZ-EMTRICITAB-TENOFOVIR 600-200-300 MG PO TABS: ORAL_TABLET | ORAL | Status: DC

## 2014-08-16 NOTE — Progress Notes (Signed)
ADAP application 

## 2014-11-05 ENCOUNTER — Other Ambulatory Visit: Payer: Self-pay | Admitting: Infectious Diseases

## 2014-12-09 ENCOUNTER — Ambulatory Visit: Payer: Self-pay

## 2014-12-10 ENCOUNTER — Other Ambulatory Visit: Payer: Self-pay | Admitting: Licensed Clinical Social Worker

## 2014-12-10 MED ORDER — EFAVIRENZ-EMTRICITAB-TENOFOVIR 600-200-300 MG PO TABS: ORAL_TABLET | ORAL | Status: DC

## 2015-01-09 ENCOUNTER — Other Ambulatory Visit: Payer: Self-pay | Admitting: Infectious Diseases

## 2015-02-14 ENCOUNTER — Other Ambulatory Visit: Payer: Self-pay | Admitting: *Deleted

## 2015-02-14 MED ORDER — EFAVIRENZ-EMTRICITAB-TENOFOVIR 600-200-300 MG PO TABS: ORAL_TABLET | ORAL | Status: DC

## 2015-03-09 ENCOUNTER — Other Ambulatory Visit: Payer: Self-pay

## 2015-03-23 ENCOUNTER — Ambulatory Visit: Payer: Self-pay | Admitting: Infectious Diseases

## 2015-04-27 ENCOUNTER — Other Ambulatory Visit: Payer: Self-pay | Admitting: *Deleted

## 2015-04-27 DIAGNOSIS — E349 Endocrine disorder, unspecified: Secondary | ICD-10-CM

## 2015-04-27 DIAGNOSIS — B20 Human immunodeficiency virus [HIV] disease: Secondary | ICD-10-CM

## 2015-04-27 MED ORDER — ESTRADIOL 0.025 MG/24HR TD PTWK: 0.0250 mg | MEDICATED_PATCH | TRANSDERMAL | Status: DC

## 2015-04-27 MED ORDER — SPIRONOLACTONE 25 MG PO TABS: 25.0000 mg | ORAL_TABLET | Freq: Two times a day (BID) | ORAL | Status: DC

## 2015-04-28 ENCOUNTER — Telehealth: Payer: Self-pay | Admitting: *Deleted

## 2015-04-28 ENCOUNTER — Other Ambulatory Visit: Payer: Self-pay | Admitting: *Deleted

## 2015-04-28 ENCOUNTER — Other Ambulatory Visit: Payer: Self-pay | Admitting: Infectious Diseases

## 2015-04-28 DIAGNOSIS — E349 Endocrine disorder, unspecified: Secondary | ICD-10-CM

## 2015-04-28 DIAGNOSIS — B20 Human immunodeficiency virus [HIV] disease: Secondary | ICD-10-CM

## 2015-04-28 MED ORDER — SPIRONOLACTONE 25 MG PO TABS: 25.0000 mg | ORAL_TABLET | Freq: Two times a day (BID) | ORAL | Status: DC

## 2015-04-28 NOTE — Telephone Encounter (Signed)
fixed

## 2015-04-28 NOTE — Telephone Encounter (Signed)
Fax from pharmacy, two directions on spironolactone; please clarify. Wendall Mola

## 2015-04-29 ENCOUNTER — Emergency Department
Admission: EM | Admit: 2015-04-29 | Discharge: 2015-04-29 | Disposition: A | Discharge status: Home / Self Care | Payer: Self-pay | Attending: Emergency Medicine | Admitting: Emergency Medicine

## 2015-04-29 DIAGNOSIS — Z79899 Other long term (current) drug therapy: Secondary | ICD-10-CM | POA: Insufficient documentation

## 2015-04-29 DIAGNOSIS — Z202 Contact with and (suspected) exposure to infections with a predominantly sexual mode of transmission: Secondary | ICD-10-CM | POA: Insufficient documentation

## 2015-04-29 DIAGNOSIS — N342 Other urethritis: Secondary | ICD-10-CM | POA: Insufficient documentation

## 2015-04-29 LAB — URINALYSIS COMPLETE WITH MICROSCOPIC (ARMC ONLY)
Bacteria, UA: NONE SEEN
Bilirubin Urine: NEGATIVE
GLUCOSE, UA: NEGATIVE mg/dL
Hgb urine dipstick: NEGATIVE
Ketones, ur: NEGATIVE mg/dL
Nitrite: NEGATIVE
PROTEIN: NEGATIVE mg/dL
Specific Gravity, Urine: 1.023 (ref 1.005–1.030)
Squamous Epithelial / LPF: NONE SEEN
pH: 6 (ref 5.0–8.0)

## 2015-04-29 LAB — CHLAMYDIA/NGC RT PCR (ARMC ONLY)
Chlamydia Tr: NOT DETECTED
N gonorrhoeae: NOT DETECTED

## 2015-04-29 MED ORDER — AZITHROMYCIN 1 G PO PACK
1.0000 g | PACK | Freq: Once | ORAL | Status: AC
  Administered 2015-04-29: 1 g via ORAL
  Filled 2015-04-29: qty 1

## 2015-04-29 MED ORDER — CEFTRIAXONE SODIUM 250 MG IJ SOLR
250.0000 mg | Freq: Once | INTRAMUSCULAR | Status: AC
  Administered 2015-04-29: 250 mg via INTRAMUSCULAR
  Filled 2015-04-29: qty 250

## 2015-04-29 NOTE — ED Notes (Signed)
Pt c/o penile discharge for the past week with burning..states him and husband just recently separated

## 2015-04-29 NOTE — Discharge Instructions (Signed)
Sexually Transmitted Disease A sexually transmitted disease (STD) is a disease or infection often passed to another person during sex. However, STDs can be passed through nonsexual ways. An STD can be passed through:  Spit (saliva).  Semen.  Blood.  Mucus from the vagina.  Pee (urine). HOW CAN I LESSEN MY CHANCES OF GETTING AN STD?  Use:  Latex condoms.  Water-soluble lubricants with condoms. Do not use petroleum jelly or oils.  Dental dams. These are small pieces of latex that are used as a barrier during oral sex.  Avoid having more than one sex partner.  Do not have sex with someone who has other sex partners.  Do not have sex with anyone you do not know or who is at high risk for an STD.  Avoid risky sex that can break your skin.  Do not have sex if you have open sores on your mouth or skin.  Avoid drinking too much alcohol or taking illegal drugs. Alcohol and drugs can affect your good judgment.  Avoid oral and anal sex acts.  Get shots (vaccines) for HPV and hepatitis.  If you are at risk of being infected with HIV, it is advised that you take a certain medicine daily to prevent HIV infection. This is called pre-exposure prophylaxis (PrEP). You may be at risk if:  You are a man who has sex with other men (MSM).  You are attracted to the opposite sex (heterosexual) and are having sex with more than one partner.  You take drugs with a needle.  You have sex with someone who has HIV.  Talk with your doctor about if you are at high risk of being infected with HIV. If you begin to take PrEP, get tested for HIV first. Get tested every 3 months for as long as you are taking PrEP. WHAT SHOULD I DO IF I THINK I HAVE AN STD?  See your doctor.  Tell your sex partner(s) that you have an STD. They should be tested and treated.  Do not have sex until your doctor says it is okay. WHEN SHOULD I GET HELP? Get help right away if:  You have bad belly (abdominal)  pain.  You are a man and have puffiness (swelling) or pain in your testicles.  You are a woman and have puffiness in your vagina. Document Released: 11/01/2004 Document Revised: 09/29/2013 Document Reviewed: 03/20/2013 [facility] Patient Information 2015 [place], [place]. This information is not intended to replace advice given to you by your health care provider. Make sure you discuss any questions you have with your health care provider.  Urethritis Urethritis is an inflammation of the tube through which urine exits your bladder (urethra).  CAUSES Urethritis is often caused by an infection in your urethra. The infection can be viral, like herpes. The infection can also be bacterial, like gonorrhea. RISK FACTORS Risk factors of urethritis include:  Having sex without using a condom.  Having multiple sexual partners.  Having poor hygiene. SIGNS AND SYMPTOMS Symptoms of urethritis are less noticeable in women than in men. These symptoms include:  Burning feeling when you urinate (dysuria).  Discharge from your urethra.  Blood in your urine (hematuria).  Urinating more than usual. DIAGNOSIS  To confirm a diagnosis of urethritis, your health care provider will do the following:  Ask about your sexual history.  Perform a physical exam.  Have you provide a sample of your urine for lab testing.  Use a cotton swab to gently collect a sample  from your urethra for lab testing. TREATMENT  It is important to treat urethritis. Depending on the cause, untreated urethritis may lead to serious genital infections and possibly infertility. Urethritis caused by a bacterial infection is treated with antibiotic medicine. All sexual partners must be treated.  HOME CARE INSTRUCTIONS  Do not have sex until the test results are known and treatment is completed, even if your symptoms go away before you finish treatment.  If you were prescribed an antibiotic, finish it all even if you start to feel  better. SEEK MEDICAL CARE IF:   Your symptoms are not improved in 3 days.  Your symptoms are getting worse.  You develop abdominal pain or pelvic pain (in women).  You develop joint pain.  You have a fever. SEEK IMMEDIATE MEDICAL CARE IF:   You have severe pain in the belly, back, or side.  You have repeated vomiting. MAKE SURE YOU:  Understand these instructions.  Will watch your condition.  Will get help right away if you are not doing well or get worse. Document Released: 03/20/2001 Document Revised: 02/08/2014 Document Reviewed: 05/25/2013 [facility] Patient Information 2015 [place], [place]. This information is not intended to replace advice given to you by your health care provider. Make sure you discuss any questions you have with your health care provider.  You have been treated for two common STDs. You should not consider yourself cured at this time. You should continue to refrain from sexual activity for at least a week, to prevent re-infection. Your partner(s) should also be treated.

## 2015-04-29 NOTE — ED Provider Notes (Signed)
[facility] Emergency Department Provider Note ____________________________________________  Time seen: 1010  I have reviewed the triage vital signs and the nursing notes.  HISTORY  Chief Complaint  Penile Discharge  HPI Renee Malone is a 29 y.o. female who is reported to the ED for complaints of penile discharge. He is concerned for exposure to STD via his husband from whom he is estranged.He reports that they recently separated, and he had some suspicions that there may have been some infidelity. He reports for the last week some increased burning with urination as well as some penile discharge. He denies any fevers, chills, sweats, gross hematuria, or penile lesions.  Past Medical History  Diagnosis Date  . HIV infection     Patient Active Problem List   Diagnosis Date Noted  . Allergic rhinitis 01/28/2013  . Precordial chest pain 12/19/2012  . Tendonitis 06/11/2012  . Depression 01/08/2012  . Hormonal imbalance in transgender patient 05/22/2011  . Human immunodeficiency virus (HIV) disease 07/21/2009    Past Surgical History  Procedure Laterality Date  . Skin graft      Current Outpatient Rx  Name  Route  Sig  Dispense  Refill  . DISCONTD: ALPRAZolam (XANAX) 1 MG tablet      TAKE ONE TABLET BY MOUTH EVERY 8 HOURS AS NEEDED   60 tablet   2   . ALPRAZolam (XANAX) 1 MG tablet   Oral   Take 1 tablet (1 mg total) by mouth every 8 (eight) hours as needed for sleep.   60 tablet   0   . citalopram (CELEXA) 10 MG tablet   Oral   Take 1 tablet (10 mg total) by mouth daily.   30 tablet   3   . efavirenz-emtricitabine-tenofovir (ATRIPLA) 600-200-300 MG per tablet      TAKE 1 TABLET BY MOUTH EVERY NIGHT AT BEDTIME   30 tablet   5   . estradiol (CLIMARA - DOSED IN MG/24 HR) 0.025 mg/24hr patch   Transdermal   Place 1 patch (0.025 mg total) onto the skin once a week.   5 patch   0   . spironolactone (ALDACTONE) 25 MG tablet   Oral    Take 1 tablet (25 mg total) by mouth 2 (two) times daily.   60 tablet   0    Allergies Broccoli  Family History  Problem Relation Age of Onset  . Hypertension Maternal Grandmother     Social History History  Substance Use Topics  . Smoking status: Never Smoker   . Smokeless tobacco: Never Used  . Alcohol Use: 0.5 oz/week    1 drink(s) per week     Comment: rarely   Review of Systems  Constitutional: Negative for fever. Eyes: Negative for visual changes. ENT: Negative for sore throat. Cardiovascular: Negative for chest pain. Respiratory: Negative for shortness of breath. Gastrointestinal: Negative for abdominal pain, vomiting and diarrhea. Genitourinary: Negative for dysuria. Penile discharge. Musculoskeletal: Negative for back pain. Skin: Negative for rash. Neurological: Negative for headaches, focal weakness or numbness. ____________________________________________  PHYSICAL EXAM:  VITAL SIGNS: ED Triage Vitals  Enc Vitals Group     BP 04/29/15 0949 119/72 mmHg     Pulse Rate 04/29/15 0949 62     Resp 04/29/15 0949 16     Temp 04/29/15 0949 98 F (36.7 C)     Temp Source 04/29/15 0949 Oral     SpO2 04/29/15 0949 100 %     Weight 04/29/15 0949 160  lb (72.576 kg)     Height 04/29/15 0949  (1.905 m)     Head Cir --      Peak Flow --      Pain Score --      Pain Loc --      Pain Edu? --      Excl. in GC? --    Constitutional: Alert and oriented. Well appearing and in no distress. Eyes: Conjunctivae are normal. PERRL. Normal extraocular movements. ENT   Head: Normocephalic and atraumatic.   Nose: No congestion/rhinnorhea.   Mouth/Throat: Mucous membranes are moist.   Neck: Supple. No thyromegaly. Hematological/Lymphatic/Immunilogical: No cervical lymphadenopathy. Cardiovascular: Normal rate, regular rhythm.  Respiratory: Normal respiratory effort. No wheezes/rales/rhonchi. Gastrointestinal: Soft and nontender. No  distention. Musculoskeletal: Nontender with normal range of motion in all extremities.  Neurologic:  Normal gait without ataxia. Normal speech and language. No gross focal neurologic deficits are appreciated. GU: deferred Skin:  Skin is warm, dry and intact. No rash noted. Psychiatric: Mood and affect are normal. Patient exhibits appropriate insight and judgment. ____________________________________________   LABS (pertinent positives/negatives)  Labs Reviewed  URINALYSIS COMPLETEWITH MICROSCOPIC ([facility] ONLY) - Abnormal; Notable for the following:    Color, Urine YELLOW (*)    APPearance CLEAR (*)    Leukocytes, UA 2+ (*)    All other components within normal limits  CHLAMYDIA/NGC RT PCR ([facility] ONLY)  ____________________________________________  PROCEDURES  Azithromycin 1 g PO Rocephin 250 mg IM ____________________________________________  INITIAL IMPRESSION / ASSESSMENT AND PLAN / ED COURSE  Urethritis due to exposure to gonorrhea/chlamydia. Empiric treatment pending culture results. Follow-up with primary provider and suggest treatment for ex-partner. ____________________________________________  FINAL CLINICAL IMPRESSION(S) / ED DIAGNOSES  Final diagnoses:  Urethritis  STD exposure     Lissa Hoard, PA-C 04/29/15 2007  Jene Every, MD 04/30/15 2033

## 2015-05-19 ENCOUNTER — Other Ambulatory Visit: Payer: Self-pay | Admitting: Infectious Diseases

## 2015-05-24 ENCOUNTER — Ambulatory Visit: Payer: Self-pay

## 2015-06-08 ENCOUNTER — Encounter (HOSPITAL_COMMUNITY): Payer: Self-pay

## 2015-06-08 ENCOUNTER — Emergency Department (HOSPITAL_COMMUNITY)
Admission: EM | Admit: 2015-06-08 | Discharge: 2015-06-08 | Disposition: A | Discharge status: Home / Self Care | Payer: Self-pay | Attending: Emergency Medicine | Admitting: Emergency Medicine

## 2015-06-08 DIAGNOSIS — R0981 Nasal congestion: Secondary | ICD-10-CM | POA: Insufficient documentation

## 2015-06-08 DIAGNOSIS — H109 Unspecified conjunctivitis: Secondary | ICD-10-CM | POA: Insufficient documentation

## 2015-06-08 DIAGNOSIS — Z21 Asymptomatic human immunodeficiency virus [HIV] infection status: Secondary | ICD-10-CM | POA: Insufficient documentation

## 2015-06-08 DIAGNOSIS — Z79899 Other long term (current) drug therapy: Secondary | ICD-10-CM | POA: Insufficient documentation

## 2015-06-08 MED ORDER — POLYMYXIN B-TRIMETHOPRIM 10000-0.1 UNIT/ML-% OP SOLN: 1.0000 [drp] | OPHTHALMIC | Status: DC

## 2015-06-08 MED ORDER — FLUORESCEIN SODIUM 1 MG OP STRP
1.0000 | ORAL_STRIP | Freq: Once | OPHTHALMIC | Status: AC
  Administered 2015-06-08: 1 via OPHTHALMIC
  Filled 2015-06-08: qty 1

## 2015-06-08 MED ORDER — TETRACAINE HCL 0.5 % OP SOLN
1.0000 [drp] | Freq: Once | OPHTHALMIC | Status: AC
  Administered 2015-06-08: 1 [drp] via OPHTHALMIC
  Filled 2015-06-08: qty 2

## 2015-06-08 MED ORDER — POLYMYXIN B-TRIMETHOPRIM 10000-0.1 UNIT/ML-% OP SOLN
1.0000 [drp] | OPHTHALMIC | Status: DC
  Administered 2015-06-08: 1 [drp] via OPHTHALMIC
  Filled 2015-06-08: qty 10

## 2015-06-08 NOTE — ED Notes (Signed)
MD at bedside. 

## 2015-06-08 NOTE — ED Provider Notes (Signed)
CSN: [id number]     Arrival date & time 06/08/15  0259 History  This chart was scribed for Renee Baton, MD by Renee Malone, ED Scribe. This patient was seen in room A02C/A02C and the patient's care was started at 3:27 AM.    Chief Complaint  Patient presents with  . Eye Pain   The history is provided by the patient. No language interpreter was used.   HPI Comments: Renee Malone is a 29 y.o. female who presents to the Emergency Department complaining of sudden new right eye pain onset tonight PTA. Pt states that he woke up and felt like something was in his eye. Pt states that he tried to wash the eye out with water with no relief. Pt denies discharge or visual disturbance.   Does report congestion but no other symptoms including fever.  Does not wear contacts regularly. Normally wears glasses.   Past Medical History  Diagnosis Date  . HIV infection    Past Surgical History  Procedure Laterality Date  . Skin graft     Family History  Problem Relation Age of Onset  . Hypertension Maternal Grandmother    Social History  Substance Use Topics  . Smoking status: Never Smoker   . Smokeless tobacco: Never Used  . Alcohol Use: 0.5 oz/week    1 drink(s) per week     Comment: rarely    Review of Systems  Constitutional: Negative.  Negative for fever.  HENT: Positive for congestion.   Eyes: Positive for photophobia, pain and redness. Negative for discharge and visual disturbance.  All other systems reviewed and are negative.     Allergies  Broccoli  Home Medications   Prior to Admission medications   Medication Sig Start Date End Date Taking? Authorizing Provider  efavirenz-emtricitabine-tenofovir (ATRIPLA) 600-200-300 MG per tablet TAKE 1 TABLET BY MOUTH EVERY NIGHT AT BEDTIME 02/14/15  Yes Ginnie Smart, MD  estradiol (CLIMARA - DOSED IN MG/24 HR) 0.025 mg/24hr patch APPLY 1 PATCH(0.025 MG) EXTERNALLY TO THE SKIN 1 TIME A WEEK 05/20/15  Yes Ginnie Smart, MD   spironolactone (ALDACTONE) 25 MG tablet TAKE 1 TABLET(25 MG) BY MOUTH TWICE DAILY 05/20/15  Yes Ginnie Smart, MD  ALPRAZolam Prudy Feeler) 1 MG tablet TAKE ONE TABLET BY MOUTH EVERY 8 HOURS AS NEEDED Patient not taking: Reported on 06/08/2015 01/10/12   Cliffton Asters, MD  ALPRAZolam Prudy Feeler) 1 MG tablet Take 1 tablet (1 mg total) by mouth every 8 (eight) hours as needed for sleep. Patient not taking: Reported on 06/08/2015 08/27/12   Ginnie Smart, MD  citalopram (CELEXA) 10 MG tablet Take 1 tablet (10 mg total) by mouth daily. Patient not taking: Reported on 06/08/2015 08/04/13   Ginnie Smart, MD  estradiol (CLIMARA - DOSED IN MG/24 HR) 0.025 mg/24hr patch Place 1 patch (0.025 mg total) onto the skin once a week. Patient not taking: Reported on 06/08/2015 04/27/15   Ginnie Smart, MD  trimethoprim-polymyxin b [facility]) ophthalmic solution Place 1 drop into the right eye every 4 (four) hours. 06/08/15   Renee Baton, MD   BP 114/78 mmHg  Pulse 66  Temp(Src) 98.1 F (36.7 C) (Oral)  SpO2 98%   Physical Exam  Constitutional: He is oriented to person, place, and time. He appears well-developed and well-nourished. No distress.  HENT:  Head: Normocephalic and atraumatic.  Eyes: Pupils are equal, round, and reactive to light.  Injected conjunctiva on the right, increased tearing, no drainage, no floor  seen uptake noted, no foreign body  Neck: Neck supple.  Cardiovascular: Normal rate and regular rhythm.   Pulmonary/Chest: Effort normal. No respiratory distress.  Musculoskeletal: He exhibits no edema.  Lymphadenopathy:    He has no cervical adenopathy.  Neurological: He is alert and oriented to person, place, and time.  Skin: Skin is warm and dry.  Psychiatric: He has a normal mood and affect.  Nursing note and vitals reviewed.   ED Course  Procedures (including critical care time) DIAGNOSTIC STUDIES: Oxygen Saturation is 97% on RA, normal by my interpretation.     COORDINATION OF CARE: 3:54 AM-Discussed treatment plan with pt at bedside and pt agreed to plan.     Labs Review Labs Reviewed - No data to display  Imaging Review No results found. I have personally reviewed and evaluated these images and lab results as part of my medical decision-making.   EKG Interpretation None      MDM   Final diagnoses:  Conjunctivitis of right eye    Patient presents with right eye pain and redness. Nontoxic on exam. No evidence of corneal abrasion or ulcer. No evidence of foreign body. Suspect conjunctivitis. Likely viral given congestion and increased tearing but no discharge. 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 Patient was given return precautions.  After history, exam, and medical workup I feel the patient has been appropriately medically screened and is safe for discharge home. Pertinent diagnoses were discussed with the patient. Patient was given return precautions.   I personally performed the services described in this documentation, which was scribed in my presence. The recorded information has been reviewed and is accurate.      Renee Baton, MD 06/08/15 [phone]

## 2015-06-08 NOTE — Discharge Instructions (Signed)
You were seen today for eye pain.  It appears that he had conjunctivitis. You need to wash her hands well. Avoid wearing contacts.  Conjunctivitis Conjunctivitis is commonly called "pink eye." Conjunctivitis can be caused by bacterial or viral infection, allergies, or injuries. There is usually redness of the lining of the eye, itching, discomfort, and sometimes discharge. There may be deposits of matter along the eyelids. A viral infection usually causes a watery discharge, while a bacterial infection causes a yellowish, thick discharge. Pink eye is very contagious and spreads by direct contact. You may be given antibiotic eyedrops as part of your treatment. Before using your eye medicine, remove all drainage from the eye by washing gently with warm water and cotton balls. Continue to use the medication until you have awakened 2 mornings in a row without discharge from the eye. Do not rub your eye. This increases the irritation and helps spread infection. Use separate towels from other household members. Wash your hands with soap and water before and after touching your eyes. Use cold compresses to reduce pain and sunglasses to relieve irritation from light. Do not wear contact lenses or wear eye makeup until the infection is gone. SEEK MEDICAL CARE IF:   Your symptoms are not better after 3 days of treatment.  You have increased pain or trouble seeing.  The outer eyelids become very red or swollen. Document Released: 11/01/2004 Document Revised: 12/17/2011 Document Reviewed: 09/24/2005 [facility] Patient Information 2015 [place], [place]. This information is not intended to replace advice given to you by your health care provider. Make sure you discuss any questions you have with your health care provider.

## 2015-06-08 NOTE — ED Notes (Signed)
Pt reports he woke up about 30 minutes ago with severe burning pain to right eye. His conjunctiva is red and eye is watery. He says it feels like there is something in it and has flushed his eye but it did not help.

## 2015-06-19 ENCOUNTER — Other Ambulatory Visit: Payer: Self-pay | Admitting: Infectious Diseases

## 2015-06-27 ENCOUNTER — Other Ambulatory Visit (INDEPENDENT_AMBULATORY_CARE_PROVIDER_SITE_OTHER): Payer: Self-pay

## 2015-06-27 DIAGNOSIS — Z79899 Other long term (current) drug therapy: Secondary | ICD-10-CM

## 2015-06-27 DIAGNOSIS — B20 Human immunodeficiency virus [HIV] disease: Secondary | ICD-10-CM

## 2015-06-27 DIAGNOSIS — Z113 Encounter for screening for infections with a predominantly sexual mode of transmission: Secondary | ICD-10-CM

## 2015-06-27 LAB — CBC WITH DIFFERENTIAL/PLATELET
BASOS ABS: 0 10*3/uL (ref 0.0–0.1)
Basophils Relative: 0 % (ref 0–1)
EOS ABS: 0.1 10*3/uL (ref 0.0–0.7)
Eosinophils Relative: 1 % (ref 0–5)
HCT: 40.8 % (ref 39.0–52.0)
Hemoglobin: 13.6 g/dL (ref 13.0–17.0)
LYMPHS ABS: 1.6 10*3/uL (ref 0.7–4.0)
Lymphocytes Relative: 24 % (ref 12–46)
MCH: 30.2 pg (ref 26.0–34.0)
MCHC: 33.3 g/dL (ref 30.0–36.0)
MCV: 90.7 fL (ref 78.0–100.0)
MONO ABS: 0.4 10*3/uL (ref 0.1–1.0)
MPV: 10.3 fL (ref 8.6–12.4)
Monocytes Relative: 6 % (ref 3–12)
Neutro Abs: 4.6 10*3/uL (ref 1.7–7.7)
Neutrophils Relative %: 69 % (ref 43–77)
Platelets: 225 10*3/uL (ref 150–400)
RBC: 4.5 MIL/uL (ref 4.22–5.81)
RDW: 13.9 % (ref 11.5–15.5)
WBC: 6.7 10*3/uL (ref 4.0–10.5)

## 2015-06-27 LAB — COMPLETE METABOLIC PANEL WITH GFR
ALT: 22 U/L (ref 9–46)
AST: 23 U/L (ref 10–40)
Albumin: 4.3 g/dL (ref 3.6–5.1)
Alkaline Phosphatase: 54 U/L (ref 40–115)
BUN: 11 mg/dL (ref 7–25)
CALCIUM: 8.9 mg/dL (ref 8.6–10.3)
CHLORIDE: 106 mmol/L (ref 98–110)
CO2: 29 mmol/L (ref 20–31)
Creat: 0.98 mg/dL (ref 0.60–1.35)
Glucose, Bld: 89 mg/dL (ref 65–99)
Potassium: 4.4 mmol/L (ref 3.5–5.3)
Sodium: 140 mmol/L (ref 135–146)
Total Bilirubin: 0.3 mg/dL (ref 0.2–1.2)
Total Protein: 6.7 g/dL (ref 6.1–8.1)

## 2015-06-27 LAB — LIPID PANEL
CHOL/HDL RATIO: 2.2 ratio (ref <=5.0)
CHOLESTEROL: 134 mg/dL (ref 125–200)
HDL: 60 mg/dL (ref >=40)
LDL CALC: 62 mg/dL (ref <130)
TRIGLYCERIDES: 58 mg/dL (ref <150)
VLDL: 12 mg/dL (ref <30)

## 2015-06-28 LAB — T-HELPER CELL (CD4) - (RCID CLINIC ONLY)
CD4 % Helper T Cell: 42 % (ref 33–55)
CD4 T CELL ABS: 630 /uL (ref 400–2700)

## 2015-06-28 LAB — URINE CYTOLOGY ANCILLARY ONLY
CHLAMYDIA, DNA PROBE: NEGATIVE
Neisseria Gonorrhea: NEGATIVE

## 2015-06-28 LAB — HIV-1 RNA QUANT-NO REFLEX-BLD
HIV 1 RNA Quant: <20 copies/mL (ref <20)
HIV-1 RNA Quant, Log: <1.3 {Log} (ref <1.30)

## 2015-06-28 LAB — RPR

## 2015-07-11 ENCOUNTER — Ambulatory Visit (INDEPENDENT_AMBULATORY_CARE_PROVIDER_SITE_OTHER): Payer: Self-pay | Admitting: Infectious Diseases

## 2015-07-11 ENCOUNTER — Encounter: Payer: Self-pay | Admitting: Infectious Diseases

## 2015-07-11 VITALS — BP 107/75 | HR 66 | Temp 98.1°F | Wt 175.0 lb

## 2015-07-11 DIAGNOSIS — Z79899 Other long term (current) drug therapy: Secondary | ICD-10-CM

## 2015-07-11 DIAGNOSIS — B20 Human immunodeficiency virus [HIV] disease: Secondary | ICD-10-CM

## 2015-07-11 DIAGNOSIS — IMO0001 Reserved for inherently not codable concepts without codable children: Secondary | ICD-10-CM

## 2015-07-11 DIAGNOSIS — Z113 Encounter for screening for infections with a predominantly sexual mode of transmission: Secondary | ICD-10-CM

## 2015-07-11 DIAGNOSIS — E349 Endocrine disorder, unspecified: Secondary | ICD-10-CM

## 2015-07-11 DIAGNOSIS — Z23 Encounter for immunization: Secondary | ICD-10-CM

## 2015-07-11 DIAGNOSIS — F640 Transsexualism: Secondary | ICD-10-CM

## 2015-07-11 MED ORDER — FINASTERIDE 5 MG PO TABS: 5.0000 mg | ORAL_TABLET | Freq: Every day | ORAL | Status: AC

## 2015-07-11 MED ORDER — MEDROXYPROGESTERONE ACETATE 5 MG PO TABS: ORAL_TABLET | ORAL | Status: DC

## 2015-07-11 NOTE — Assessment & Plan Note (Signed)
Doing well.  Can consider updated med at f/u Will check rectal gc due to sx but his exam does not suggest this.  Will see back in 6 months.  Partner is negative, prev on PREP, now off. Gets tested regularly.

## 2015-07-11 NOTE — Assessment & Plan Note (Addendum)
Will refill proscar, provera.  Continue estrogen Need mammo

## 2015-07-11 NOTE — Addendum Note (Signed)
Addended by: Judit Awad C on: 07/11/2015 05:50 PM   Modules accepted: Orders

## 2015-07-11 NOTE — Progress Notes (Signed)
   Subjective:    Patient ID: Renee Malone, female    DOB: 1986-01-28, 29 y.o.   MRN: [id number]  HPI 29 yo M --> F with Dx HIV+ 06-17-09. Has been on atripla as only HIV medication. On estrogen injections, spironolactone. Since last visit has separated from husband.   Was seen in ER for GC.  Received IM injection.  Feels like has residual sx- has some genital chafing due to wt gain. Denies d/c. Has occas itch.   HIV 1 RNA QUANT (copies/mL)  Date Value  06/27/2015 <20  08/11/2014 <20  11/10/2013 <20   CD4 T CELL ABS (/uL)  Date Value  06/27/2015 630  08/11/2014 990  11/10/2013 1000   Would like refill of proscar and provera.     Review of Systems  Constitutional: Negative for fever, chills, appetite change and unexpected weight change.  Gastrointestinal: Negative for diarrhea and constipation.  Genitourinary: Negative for difficulty urinating.       Objective:   Physical Exam  Constitutional: He appears well-developed and well-nourished.  HENT:  Mouth/Throat: No oropharyngeal exudate.  Eyes: EOM are normal. Pupils are equal, round, and reactive to light.  Neck: Neck supple.  Cardiovascular: Normal rate and normal heart sounds.   Pulmonary/Chest: Effort normal and breath sounds normal.  Abdominal: Soft. Bowel sounds are normal. There is no tenderness. There is no rebound.  Genitourinary:     Lymphadenopathy:    He has no cervical adenopathy.      Assessment & Plan:

## 2015-07-13 ENCOUNTER — Ambulatory Visit: Payer: Self-pay

## 2015-07-13 ENCOUNTER — Telehealth: Payer: Self-pay | Admitting: *Deleted

## 2015-07-13 LAB — CYTOLOGY, (ORAL, ANAL, URETHRAL) ANCILLARY ONLY
Chlamydia: POSITIVE — AB
NEISSERIA GONORRHEA: NEGATIVE

## 2015-07-13 NOTE — Telephone Encounter (Signed)
Called the patient and advised her lab results were positive for chlymidya and she needs to be treated. Per verbal from Dr Ninetta Lights made an appt to treat the patient with 1G Azithromycin once, repeated and verified.

## 2015-07-14 ENCOUNTER — Other Ambulatory Visit: Payer: Self-pay | Admitting: *Deleted

## 2015-07-14 ENCOUNTER — Ambulatory Visit (INDEPENDENT_AMBULATORY_CARE_PROVIDER_SITE_OTHER): Payer: Self-pay | Admitting: *Deleted

## 2015-07-14 DIAGNOSIS — B20 Human immunodeficiency virus [HIV] disease: Secondary | ICD-10-CM

## 2015-07-14 DIAGNOSIS — A749 Chlamydial infection, unspecified: Secondary | ICD-10-CM

## 2015-07-14 MED ORDER — AZITHROMYCIN 250 MG PO TABS
1000.0000 mg | ORAL_TABLET | Freq: Every day | ORAL | Status: DC
  Administered 2015-07-14: 1000 mg via ORAL

## 2015-07-15 NOTE — Addendum Note (Signed)
Addended by: Mariea Clonts D on: 07/15/2015 11:10 AM   Modules accepted: Orders

## 2015-08-19 ENCOUNTER — Other Ambulatory Visit: Payer: Self-pay | Admitting: Infectious Diseases

## 2015-09-20 ENCOUNTER — Other Ambulatory Visit: Payer: Self-pay | Admitting: Infectious Diseases

## 2015-11-02 ENCOUNTER — Encounter: Payer: Self-pay | Admitting: Infectious Diseases

## 2015-11-02 ENCOUNTER — Ambulatory Visit (INDEPENDENT_AMBULATORY_CARE_PROVIDER_SITE_OTHER): Payer: Self-pay | Admitting: Infectious Diseases

## 2015-11-02 VITALS — BP 119/77 | HR 67 | Temp 98.0°F | Ht 75.0 in | Wt 184.0 lb

## 2015-11-02 DIAGNOSIS — IMO0001 Reserved for inherently not codable concepts without codable children: Secondary | ICD-10-CM

## 2015-11-02 DIAGNOSIS — B20 Human immunodeficiency virus [HIV] disease: Secondary | ICD-10-CM

## 2015-11-02 DIAGNOSIS — Z79899 Other long term (current) drug therapy: Secondary | ICD-10-CM

## 2015-11-02 DIAGNOSIS — Z113 Encounter for screening for infections with a predominantly sexual mode of transmission: Secondary | ICD-10-CM

## 2015-11-02 DIAGNOSIS — R21 Rash and other nonspecific skin eruption: Secondary | ICD-10-CM | POA: Insufficient documentation

## 2015-11-02 DIAGNOSIS — F640 Transsexualism: Secondary | ICD-10-CM

## 2015-11-02 DIAGNOSIS — E349 Endocrine disorder, unspecified: Secondary | ICD-10-CM

## 2015-11-02 LAB — LIPID PANEL
Cholesterol: 150 mg/dL (ref 125–200)
HDL: 54 mg/dL (ref >=40)
LDL CALC: 80 mg/dL (ref <130)
TRIGLYCERIDES: 81 mg/dL (ref <150)
Total CHOL/HDL Ratio: 2.8 Ratio (ref <=5.0)
VLDL: 16 mg/dL (ref <30)

## 2015-11-02 LAB — COMPREHENSIVE METABOLIC PANEL
ALT: 25 U/L (ref 9–46)
AST: 26 U/L (ref 10–40)
Albumin: 4.2 g/dL (ref 3.6–5.1)
Alkaline Phosphatase: 53 U/L (ref 40–115)
BILIRUBIN TOTAL: 0.3 mg/dL (ref 0.2–1.2)
BUN: 11 mg/dL (ref 7–25)
CALCIUM: 9.5 mg/dL (ref 8.6–10.3)
CHLORIDE: 100 mmol/L (ref 98–110)
CO2: 32 mmol/L — AB (ref 20–31)
Creat: 1.11 mg/dL (ref 0.60–1.35)
GLUCOSE: 86 mg/dL (ref 65–99)
Potassium: 4 mmol/L (ref 3.5–5.3)
Sodium: 139 mmol/L (ref 135–146)
Total Protein: 7.1 g/dL (ref 6.1–8.1)

## 2015-11-02 LAB — CBC
HEMATOCRIT: 42.3 % (ref 39.0–52.0)
Hemoglobin: 14.4 g/dL (ref 13.0–17.0)
MCH: 31.4 pg (ref 26.0–34.0)
MCHC: 34 g/dL (ref 30.0–36.0)
MCV: 92.4 fL (ref 78.0–100.0)
MPV: 9.7 fL (ref 8.6–12.4)
Platelets: 271 10*3/uL (ref 150–400)
RBC: 4.58 MIL/uL (ref 4.22–5.81)
RDW: 14 % (ref 11.5–15.5)
WBC: 6.4 10*3/uL (ref 4.0–10.5)

## 2015-11-02 MED ORDER — EMTRICITAB-RILPIVIR-TENOFOV AF 200-25-25 MG PO TABS: 1.0000 | ORAL_TABLET | Freq: Every day | ORAL | Status: DC

## 2015-11-02 MED ORDER — HYDROCORTISONE 1 % EX OINT: 1.0000 "application " | TOPICAL_OINTMENT | Freq: Two times a day (BID) | CUTANEOUS | Status: DC

## 2015-11-02 NOTE — Progress Notes (Signed)
   Subjective:    Patient ID: Renee Malone, female    DOB: [birth year], 30 y.o.   MRN: [id number]  HPI  30 yo M --> F with Dx HIV+ 06-17-09. Has been on atripla as only HIV medication. On estrogen injections, spironolactone. Today worried about papaules and pruritis. Has FHx of eczema. Has moved into new appt. Has had this de-flea'd, no bed bugs. His dog does not have fleas. No new soaps or shampoos. No new rx. Has not tried benadryl or zyrtec.   Ran out of estrogen between visits. Had breast tenderness.   At prev visit in October, was positive for chlamydia. He was given azithro then. Has had no further issues.   HIV 1 RNA QUANT (copies/mL)  Date Value  06/27/2015 <20  08/11/2014 <20  11/10/2013 <20   CD4 T CELL ABS (/uL)  Date Value  06/27/2015 630  08/11/2014 990  11/10/2013 1000     Review of Systems  Constitutional: Negative for appetite change and unexpected weight change.  Gastrointestinal: Negative for diarrhea and constipation.  Genitourinary: Negative for dysuria, discharge, difficulty urinating, genital sores and penile pain.  Skin: Positive for rash.  Neurological: Negative for dizziness.       Objective:   Physical Exam  Constitutional: He appears well-developed and well-nourished.  HENT:  Mouth/Throat: No oropharyngeal exudate.  Eyes: EOM are normal. Pupils are equal, round, and reactive to light.  Neck: Neck supple.  Cardiovascular: Normal rate, regular rhythm and normal heart sounds.   Pulmonary/Chest: Effort normal and breath sounds normal.  Abdominal: Soft. Bowel sounds are normal. There is no tenderness. There is no rebound.  Musculoskeletal: He exhibits no edema.  Lymphadenopathy:    He has no cervical adenopathy.      Assessment & Plan:

## 2015-11-02 NOTE — Assessment & Plan Note (Signed)
States going for gender re-assignment surgery.  Will support as we can.

## 2015-11-02 NOTE — Assessment & Plan Note (Signed)
Will give rx for hydrocortisone topical rx prn Asked to try OTC zyrtec, allegra or claritin. Can try benadryl at hs.

## 2015-11-02 NOTE — Assessment & Plan Note (Signed)
Will change to odefsy, asked to take with food.  Offered/refused condoms.  Has gotten flu shot.  Check labs today Recheck STD screening, prev treated 07-14-15  Will see back in 4 months with labs

## 2015-11-02 NOTE — Addendum Note (Signed)
Addended by: Alesia Morin F on: 11/02/2015 05:00 PM   Modules accepted: Orders

## 2015-11-03 LAB — RPR

## 2015-11-04 LAB — URINE CYTOLOGY ANCILLARY ONLY
Chlamydia: NEGATIVE
Neisseria Gonorrhea: NEGATIVE

## 2015-11-04 LAB — T-HELPER CELL (CD4) - (RCID CLINIC ONLY)
CD4 % Helper T Cell: 47 % (ref 33–55)
CD4 T CELL ABS: 910 /uL (ref 400–2700)

## 2015-11-04 LAB — HIV-1 RNA QUANT-NO REFLEX-BLD: HIV 1 RNA Quant: <20 copies/mL (ref <20)

## 2015-12-12 ENCOUNTER — Ambulatory Visit: Payer: Self-pay

## 2015-12-14 ENCOUNTER — Encounter (HOSPITAL_COMMUNITY): Payer: Self-pay | Admitting: *Deleted

## 2015-12-14 ENCOUNTER — Emergency Department (HOSPITAL_COMMUNITY)
Admission: EM | Admit: 2015-12-14 | Discharge: 2015-12-14 | Disposition: A | Discharge status: Home / Self Care | Payer: Self-pay | Attending: Emergency Medicine | Admitting: Emergency Medicine

## 2015-12-14 ENCOUNTER — Emergency Department (HOSPITAL_COMMUNITY): Payer: Self-pay

## 2015-12-14 DIAGNOSIS — B20 Human immunodeficiency virus [HIV] disease: Secondary | ICD-10-CM | POA: Insufficient documentation

## 2015-12-14 DIAGNOSIS — R52 Pain, unspecified: Secondary | ICD-10-CM | POA: Insufficient documentation

## 2015-12-14 DIAGNOSIS — R509 Fever, unspecified: Secondary | ICD-10-CM | POA: Insufficient documentation

## 2015-12-14 LAB — CBC WITH DIFFERENTIAL/PLATELET
BASOS ABS: 0 10*3/uL (ref 0.0–0.1)
BASOS PCT: 0 %
Eosinophils Absolute: 0 10*3/uL (ref 0.0–0.7)
Eosinophils Relative: 0 %
HEMATOCRIT: 44.1 % (ref 39.0–52.0)
HEMOGLOBIN: 15.7 g/dL (ref 13.0–17.0)
Lymphocytes Relative: 4 %
Lymphs Abs: 0.4 10*3/uL — ABNORMAL LOW (ref 0.7–4.0)
MCH: 32 pg (ref 26.0–34.0)
MCHC: 35.6 g/dL (ref 30.0–36.0)
MCV: 89.8 fL (ref 78.0–100.0)
Monocytes Absolute: 0.5 10*3/uL (ref 0.1–1.0)
Monocytes Relative: 5 %
NEUTROS ABS: 8.6 10*3/uL — AB (ref 1.7–7.7)
NEUTROS PCT: 91 %
Platelets: 164 10*3/uL (ref 150–400)
RBC: 4.91 MIL/uL (ref 4.22–5.81)
RDW: 12.2 % (ref 11.5–15.5)
WBC: 9.5 10*3/uL (ref 4.0–10.5)

## 2015-12-14 LAB — COMPREHENSIVE METABOLIC PANEL
ALK PHOS: 36 U/L — AB (ref 38–126)
ALT: 31 U/L (ref 17–63)
ANION GAP: 13 (ref 5–15)
AST: 40 U/L (ref 15–41)
Albumin: 3.8 g/dL (ref 3.5–5.0)
BILIRUBIN TOTAL: 0.6 mg/dL (ref 0.3–1.2)
BUN: 15 mg/dL (ref 6–20)
CALCIUM: 8.9 mg/dL (ref 8.9–10.3)
CO2: 26 mmol/L (ref 22–32)
Chloride: 94 mmol/L — ABNORMAL LOW (ref 101–111)
Creatinine, Ser: 1.51 mg/dL — ABNORMAL HIGH (ref 0.61–1.24)
Glucose, Bld: 122 mg/dL — ABNORMAL HIGH (ref 65–99)
Potassium: 3.8 mmol/L (ref 3.5–5.1)
Sodium: 133 mmol/L — ABNORMAL LOW (ref 135–145)
TOTAL PROTEIN: 7.4 g/dL (ref 6.5–8.1)

## 2015-12-14 LAB — I-STAT CG4 LACTIC ACID, ED: Lactic Acid, Venous: 1.39 mmol/L (ref 0.5–2.0)

## 2015-12-14 MED ORDER — ACETAMINOPHEN 325 MG PO TABS
650.0000 mg | ORAL_TABLET | Freq: Once | ORAL | Status: AC | PRN
  Administered 2015-12-14: 650 mg via ORAL

## 2015-12-14 MED ORDER — ACETAMINOPHEN 325 MG PO TABS
ORAL_TABLET | ORAL | Status: AC
  Filled 2015-12-14: qty 2

## 2015-12-14 NOTE — ED Notes (Signed)
Pt reports generalized body aches, chills, fever, and headache for 2 days.

## 2015-12-14 NOTE — ED Notes (Signed)
Patient called 3X with no response 

## 2015-12-16 ENCOUNTER — Encounter (HOSPITAL_COMMUNITY): Payer: Self-pay

## 2015-12-16 ENCOUNTER — Emergency Department (HOSPITAL_COMMUNITY)
Admission: EM | Admit: 2015-12-16 | Discharge: 2015-12-16 | Disposition: A | Discharge status: Home / Self Care | Payer: Self-pay | Attending: Emergency Medicine | Admitting: Emergency Medicine

## 2015-12-16 DIAGNOSIS — M791 Myalgia: Secondary | ICD-10-CM | POA: Insufficient documentation

## 2015-12-16 DIAGNOSIS — R509 Fever, unspecified: Secondary | ICD-10-CM | POA: Insufficient documentation

## 2015-12-16 DIAGNOSIS — R197 Diarrhea, unspecified: Secondary | ICD-10-CM | POA: Insufficient documentation

## 2015-12-16 DIAGNOSIS — R112 Nausea with vomiting, unspecified: Secondary | ICD-10-CM | POA: Insufficient documentation

## 2015-12-16 DIAGNOSIS — Z79899 Other long term (current) drug therapy: Secondary | ICD-10-CM | POA: Insufficient documentation

## 2015-12-16 DIAGNOSIS — R109 Unspecified abdominal pain: Secondary | ICD-10-CM | POA: Insufficient documentation

## 2015-12-16 DIAGNOSIS — R51 Headache: Secondary | ICD-10-CM | POA: Insufficient documentation

## 2015-12-16 DIAGNOSIS — B20 Human immunodeficiency virus [HIV] disease: Secondary | ICD-10-CM | POA: Insufficient documentation

## 2015-12-16 LAB — CBC WITH DIFFERENTIAL/PLATELET
BASOS ABS: 0.1 10*3/uL (ref 0.0–0.1)
Basophils Relative: 2 %
EOS PCT: 1 %
Eosinophils Absolute: 0.1 10*3/uL (ref 0.0–0.7)
HEMATOCRIT: 43 % (ref 39.0–52.0)
Hemoglobin: 15.7 g/dL (ref 13.0–17.0)
LYMPHS PCT: 23 %
Lymphs Abs: 1.4 10*3/uL (ref 0.7–4.0)
MCH: 31.7 pg (ref 26.0–34.0)
MCHC: 36.5 g/dL — ABNORMAL HIGH (ref 30.0–36.0)
MCV: 86.9 fL (ref 78.0–100.0)
Monocytes Absolute: 1.2 10*3/uL — ABNORMAL HIGH (ref 0.1–1.0)
Monocytes Relative: 19 %
NEUTROS ABS: 3.4 10*3/uL (ref 1.7–7.7)
NEUTROS PCT: 55 %
PLATELETS: 165 10*3/uL (ref 150–400)
RBC: 4.95 MIL/uL (ref 4.22–5.81)
RDW: 12.2 % (ref 11.5–15.5)
WBC: 6.1 10*3/uL (ref 4.0–10.5)

## 2015-12-16 LAB — COMPREHENSIVE METABOLIC PANEL
ALT: 34 U/L (ref 17–63)
AST: 40 U/L (ref 15–41)
Albumin: 4.1 g/dL (ref 3.5–5.0)
Alkaline Phosphatase: 37 U/L — ABNORMAL LOW (ref 38–126)
Anion gap: 11 (ref 5–15)
BUN: 14 mg/dL (ref 6–20)
CHLORIDE: 100 mmol/L — AB (ref 101–111)
CO2: 28 mmol/L (ref 22–32)
CREATININE: 1.16 mg/dL (ref 0.61–1.24)
Calcium: 9 mg/dL (ref 8.9–10.3)
GFR calc Af Amer: >60 mL/min (ref >60)
Glucose, Bld: 95 mg/dL (ref 65–99)
Potassium: 4 mmol/L (ref 3.5–5.1)
Sodium: 139 mmol/L (ref 135–145)
Total Bilirubin: 0.4 mg/dL (ref 0.3–1.2)
Total Protein: 7.8 g/dL (ref 6.5–8.1)

## 2015-12-16 LAB — LIPASE, BLOOD: LIPASE: 22 U/L (ref 11–51)

## 2015-12-16 MED ORDER — KETOROLAC TROMETHAMINE 30 MG/ML IJ SOLN
30.0000 mg | Freq: Once | INTRAMUSCULAR | Status: AC
  Administered 2015-12-16: 30 mg via INTRAVENOUS
  Filled 2015-12-16: qty 1

## 2015-12-16 MED ORDER — ONDANSETRON HCL 4 MG PO TABS: 4.0000 mg | ORAL_TABLET | Freq: Three times a day (TID) | ORAL | Status: DC | PRN

## 2015-12-16 MED ORDER — SODIUM CHLORIDE 0.9 % IV BOLUS (SEPSIS)
1000.0000 mL | Freq: Once | INTRAVENOUS | Status: AC
  Administered 2015-12-16: 1000 mL via INTRAVENOUS

## 2015-12-16 MED ORDER — ONDANSETRON HCL 4 MG/2ML IJ SOLN
4.0000 mg | Freq: Once | INTRAMUSCULAR | Status: AC
  Administered 2015-12-16: 4 mg via INTRAVENOUS
  Filled 2015-12-16: qty 2

## 2015-12-16 NOTE — ED Notes (Signed)
Patient reports that he has had flu-like symptoms x 4 days. Patient states that he had chills 3 days ago and yesterday had N/V/D and abdominal pain. Today he has had one episode of vomiting and was projectile in nature

## 2015-12-16 NOTE — Discharge Instructions (Signed)
This is likely a viral stomach flu. Continue to drink plenty of fluids and get well rested. Return for worsening symptoms,including fevers, vomiting and unable to keep down food/fluids, worsening pain,or any other symptoms concerning to you.  Diarrhea Diarrhea is frequent loose and watery bowel movements. It can cause you to feel weak and dehydrated. Dehydration can cause you to become tired and thirsty, have a dry mouth, and have decreased urination that often is dark yellow. Diarrhea is a sign of another problem, most often an infection that will not last long. In most cases, diarrhea typically lasts 2-3 days. However, it can last longer if it is a sign of something more serious. It is important to treat your diarrhea as directed by your caregiver to lessen or prevent future episodes of diarrhea. CAUSES  Some common causes include:  Gastrointestinal infections caused by viruses, bacteria, or parasites.  Food poisoning or food allergies.  Certain medicines, such as antibiotics, chemotherapy, and laxatives.  Artificial sweeteners and fructose.  Digestive disorders. HOME CARE INSTRUCTIONS  Ensure adequate fluid intake (hydration): Have 1 cup (8 oz) of fluid for each diarrhea episode. Avoid fluids that contain simple sugars or sports drinks, fruit juices, whole milk products, and sodas. Your urine should be clear or pale yellow if you are drinking enough fluids. Hydrate with an oral rehydration solution that you can purchase at pharmacies, retail stores, and online. You can prepare an oral rehydration solution at home by mixing the following ingredients together:   - tsp table salt.   tsp baking soda.   tsp salt substitute containing potassium chloride.  1  tablespoons sugar.  1 L (34 oz) of water.  Certain foods and beverages may increase the speed at which food moves through the gastrointestinal (GI) tract. These foods and beverages should be avoided and include:  Caffeinated and  alcoholic beverages.  High-fiber foods, such as raw fruits and vegetables, nuts, seeds, and whole grain breads and cereals.  Foods and beverages sweetened with sugar alcohols, such as xylitol, sorbitol, and mannitol.  Some foods may be well tolerated and may help thicken stool including:  Starchy foods, such as rice, toast, pasta, low-sugar cereal, oatmeal, grits, baked potatoes, crackers, and bagels.  Bananas.  Applesauce.  Add probiotic-rich foods to help increase healthy bacteria in the GI tract, such as yogurt and fermented milk products.  Wash your hands well after each diarrhea episode.  Only take over-the-counter or prescription medicines as directed by your caregiver.  Take a warm bath to relieve any burning or pain from frequent diarrhea episodes. SEEK IMMEDIATE MEDICAL CARE IF:   You are unable to keep fluids down.  You have persistent vomiting.  You have blood in your stool, or your stools are black and tarry.  You do not urinate in 6-8 hours, or there is only a small amount of very dark urine.  You have abdominal pain that increases or localizes.  You have weakness, dizziness, confusion, or light-headedness.  You have a severe headache.  Your diarrhea gets worse or does not get better.  You have a fever or persistent symptoms for more than 2-3 days.  You have a fever and your symptoms suddenly get worse. MAKE SURE YOU:   Understand these instructions.  Will watch your condition.  Will get help right away if you are not doing well or get worse.   This information is not intended to replace advice given to you by your health care provider. Make sure you discuss any  questions you have with your health care provider.   Document Released: 09/14/2002 Document Revised: 10/15/2014 Document Reviewed: 06/01/2012 Elsevier Interactive Patient Education 2016 [employer]  Nausea and Vomiting Nausea means you feel sick to your stomach. Throwing up (vomiting) is  a reflex where stomach contents come out of your mouth. HOME CARE   Take medicine as told by your doctor.  Do not force yourself to eat. However, you do need to drink fluids.  If you feel like eating, eat a normal diet as told by your doctor.  Eat rice, wheat, potatoes, bread, lean meats, yogurt, fruits, and vegetables.  Avoid high-fat foods.  Drink enough fluids to keep your pee (urine) clear or pale yellow.  Ask your doctor how to replace body fluid losses (rehydrate). Signs of body fluid loss (dehydration) include:  Feeling very thirsty.  Dry lips and mouth.  Feeling dizzy.  Dark pee.  Peeing less than normal.  Feeling confused.  Fast breathing or heart rate. GET HELP RIGHT AWAY IF:   You have blood in your throw up.  You have black or bloody poop (stool).  You have a bad headache or stiff neck.  You feel confused.  You have bad belly (abdominal) pain.  You have chest pain or trouble breathing.  You do not pee at least once every 8 hours.  You have cold, clammy skin.  You keep throwing up after 24 to 48 hours.  You have a fever. MAKE SURE YOU:   Understand these instructions.  Will watch your condition.  Will get help right away if you are not doing well or get worse.   This information is not intended to replace advice given to you by your health care provider. Make sure you discuss any questions you have with your health care provider.   Document Released: 03/12/2008 Document Revised: 12/17/2011 Document Reviewed: 02/23/2011 Elsevier Interactive Patient Education [employer]

## 2015-12-16 NOTE — ED Provider Notes (Signed)
CSN: [id number]     Arrival date & time 12/16/15  1330 History   First MD Initiated Contact with Patient 12/16/15 1920     Chief Complaint  Patient presents with  . Emesis  . Diarrhea     (Consider location/radiation/quality/duration/timing/severity/associated sxs/prior Treatment) HPI 30 year old female to female transgender patient who presents with nausea, vomiting, and diarrhea. States having fever, chills, headache, myalgias over four days. Had profuse watery stools yesterday and then vomiting this morning. Associated with intermittent abdominal cramping. No significant cough, congestion, sore throat, or runny nose. No urinary complaints.  History of HIV with normal CD4 in 10/2015.   Past Medical History  Diagnosis Date  . HIV infection [facility])    Past Surgical History  Procedure Laterality Date  . Skin graft     Family History  Problem Relation Age of Onset  . Hypertension Maternal Grandmother    Social History  Substance Use Topics  . Smoking status: Never Smoker   . Smokeless tobacco: Never Used  . Alcohol Use: 0.5 oz/week    1 drink(s) per week     Comment: rarely    Review of Systems 10/14 systems reviewed and are negative other than those stated in the HPI    Allergies  Broccoli  Home Medications   Prior to Admission medications   Medication Sig Start Date End Date Taking? Authorizing Provider  acetaminophen (TYLENOL) 500 MG tablet Take 1,000 mg by mouth every 6 (six) hours as needed for moderate pain or fever.   Yes Historical Provider, MD  emtricitabine-rilpivir-tenofovir AF (ODEFSEY) 200-25-25 MG TABS tablet Take 1 tablet by mouth daily with breakfast. 11/02/15  Yes Ginnie Smart, MD  estradiol (CLIMARA - DOSED IN MG/24 HR) 0.025 mg/24hr patch Place 1 patch (0.025 mg total) onto the skin once a week. 04/27/15  Yes Ginnie Smart, MD  finasteride (PROSCAR) 5 MG tablet Take 1 tablet (5 mg total) by mouth daily. 07/11/15 07/10/16 Yes Ginnie Smart, MD   hydrocortisone 1 % ointment Apply 1 application topically 2 (two) times daily. Patient taking differently: Apply 1 application topically 2 (two) times daily as needed for itching.  11/02/15  Yes Ginnie Smart, MD  loperamide (IMODIUM A-D) 2 MG tablet Take 2 mg by mouth 4 (four) times daily as needed for diarrhea or loose stools.   Yes Historical Provider, MD  spironolactone (ALDACTONE) 25 MG tablet TAKE 1 TABLET(25 MG) BY MOUTH TWICE DAILY 06/20/15  Yes Ginnie Smart, MD  ALPRAZolam Prudy Feeler) 1 MG tablet TAKE ONE TABLET BY MOUTH EVERY 8 HOURS AS NEEDED Patient not taking: Reported on 06/08/2015 01/10/12   Cliffton Asters, MD  ATRIPLA 600-200-300 MG tablet TAKE 1 TABLET BY MOUTH EVERY NIGHT AT BEDTIME Patient not taking: Reported on 12/16/2015 09/20/15   Ginnie Smart, MD  citalopram (CELEXA) 10 MG tablet Take 1 tablet (10 mg total) by mouth daily. Patient not taking: Reported on 12/16/2015 08/04/13   Ginnie Smart, MD  estradiol (CLIMARA - DOSED IN MG/24 HR) 0.025 mg/24hr patch PLACE 1 PATCH ONTO THE SKIN ONCE A WEEK Patient not taking: Reported on 12/16/2015 08/22/15   Ginnie Smart, MD  medroxyPROGESTERone (PROVERA) 5 MG tablet Use as directed Patient not taking: Reported on 12/16/2015 07/11/15   Ginnie Smart, MD  ondansetron (ZOFRAN) 4 MG tablet Take 1 tablet (4 mg total) by mouth every 8 (eight) hours as needed for nausea or vomiting. 12/16/15   Lavera Guise, MD  spironolactone (  ALDACTONE) 25 MG tablet TAKE ONE TABLET BY MOUTH TWICE DAILY Patient not taking: Reported on 12/16/2015 08/22/15   Ginnie SmartJeffrey C Hatcher, MD   BP 120/78 mmHg  Pulse 69  Temp(Src) 98.5 F (36.9 C) (Oral)  Resp 20  Ht 6' 3.25" (1.911 m)  Wt 186 lb (84.369 kg)  BMI 23.10 kg/m2  SpO2 100% Physical Exam Physical Exam  Nursing note and vitals reviewed. Constitutional: Well developed, well nourished, non-toxic, and in no acute distress Head: Normocephalic and atraumatic.  Mouth/Throat: Oropharynx is clear  and moist.  Neck: Normal range of motion. Neck supple.  Cardiovascular: Normal rate and regular rhythm.   Pulmonary/Chest: Effort normal and breath sounds normal.  Abdominal: Soft. There is no tenderness. There is no rebound and no guarding.  Musculoskeletal: Normal range of motion.  Neurological: Alert, no facial droop, fluent speech, moves all extremities symmetrically Skin: Skin is warm and dry.  Psychiatric: Cooperative  ED Course  Procedures (including critical care time) Labs Review Labs Reviewed  CBC WITH DIFFERENTIAL/PLATELET - Abnormal; Notable for the following:    MCHC 36.5 (*)    Monocytes Absolute 1.2 (*)    All other components within normal limits  COMPREHENSIVE METABOLIC PANEL - Abnormal; Notable for the following:    Chloride 100 (*)    Alkaline Phosphatase 37 (*)    All other components within normal limits  LIPASE, BLOOD    Imaging Review No results found. I have personally reviewed and evaluated these images and lab results as part of my medical decision-making.   EKG Interpretation None      MDM   Final diagnoses:  Nausea vomiting and diarrhea    30 year old female to female transgender patient with history of HIV who presents with N/V/D. Well appearing on presentation. Stable vital signs. Soft and benign abdomen. Appears well hydrated. Presentation suggestive of like viral or flu like illness. CBC, CMP, lipase unremarkable. Feels improved with IVF, zofran, and toradol. Able to tolerate PO. Low suspicion for serious intraabdominal processes or serious bacterial illness. Appropriate for discharge and supportive management from home. Given prescription for zofran. Strict return and follow-up instructions reviewed. He expressed understanding of all discharge instructions and felt comfortable with the plan of care.     Lavera Guiseana Duo Daven Montz, MD 12/16/15 [phone]

## 2016-02-16 ENCOUNTER — Other Ambulatory Visit: Payer: Self-pay

## 2016-03-01 ENCOUNTER — Ambulatory Visit: Payer: Self-pay | Admitting: Infectious Diseases

## 2016-05-03 ENCOUNTER — Other Ambulatory Visit: Payer: Self-pay | Admitting: Infectious Diseases

## 2016-07-02 ENCOUNTER — Ambulatory Visit: Payer: Self-pay

## 2016-07-02 ENCOUNTER — Other Ambulatory Visit (INDEPENDENT_AMBULATORY_CARE_PROVIDER_SITE_OTHER): Payer: Self-pay

## 2016-07-02 DIAGNOSIS — Z113 Encounter for screening for infections with a predominantly sexual mode of transmission: Secondary | ICD-10-CM

## 2016-07-02 DIAGNOSIS — B20 Human immunodeficiency virus [HIV] disease: Secondary | ICD-10-CM

## 2016-07-02 LAB — COMPREHENSIVE METABOLIC PANEL
ALBUMIN: 4.5 g/dL (ref 3.6–5.1)
ALT: 15 U/L (ref 9–46)
AST: 18 U/L (ref 10–40)
Alkaline Phosphatase: 34 U/L — ABNORMAL LOW (ref 40–115)
BILIRUBIN TOTAL: 0.4 mg/dL (ref 0.2–1.2)
BUN: 11 mg/dL (ref 7–25)
CALCIUM: 9.6 mg/dL (ref 8.6–10.3)
CO2: 31 mmol/L (ref 20–31)
Chloride: 102 mmol/L (ref 98–110)
Creat: 1.19 mg/dL (ref 0.60–1.35)
Glucose, Bld: 95 mg/dL (ref 65–99)
Potassium: 4 mmol/L (ref 3.5–5.3)
Sodium: 138 mmol/L (ref 135–146)
TOTAL PROTEIN: 7 g/dL (ref 6.1–8.1)

## 2016-07-02 LAB — CBC
HEMATOCRIT: 43.7 % (ref 38.5–50.0)
HEMOGLOBIN: 14.7 g/dL (ref 13.2–17.1)
MCH: 30.6 pg (ref 27.0–33.0)
MCHC: 33.6 g/dL (ref 32.0–36.0)
MCV: 90.9 fL (ref 80.0–100.0)
MPV: 10 fL (ref 7.5–12.5)
Platelets: 253 10*3/uL (ref 140–400)
RBC: 4.81 MIL/uL (ref 4.20–5.80)
RDW: 13.1 % (ref 11.0–15.0)
WBC: 6.2 10*3/uL (ref 3.8–10.8)

## 2016-07-03 ENCOUNTER — Encounter: Payer: Self-pay | Admitting: Infectious Diseases

## 2016-07-03 LAB — URINE CYTOLOGY ANCILLARY ONLY
CHLAMYDIA, DNA PROBE: NEGATIVE
Neisseria Gonorrhea: NEGATIVE

## 2016-07-03 LAB — RPR

## 2016-07-03 LAB — T-HELPER CELL (CD4) - (RCID CLINIC ONLY)
CD4 % Helper T Cell: 41 % (ref 33–55)
CD4 T Cell Abs: 930 /uL (ref 400–2700)

## 2016-07-04 LAB — HIV-1 RNA QUANT-NO REFLEX-BLD
HIV 1 RNA Quant: <20 copies/mL (ref <20)
HIV-1 RNA Quant, Log: <1.3 Log copies/mL (ref <1.30)

## 2016-07-16 ENCOUNTER — Ambulatory Visit (INDEPENDENT_AMBULATORY_CARE_PROVIDER_SITE_OTHER): Payer: Self-pay | Admitting: Infectious Diseases

## 2016-07-16 ENCOUNTER — Encounter: Payer: Self-pay | Admitting: Infectious Diseases

## 2016-07-16 VITALS — BP 98/63 | HR 84 | Temp 98.5°F | Ht 75.25 in | Wt 200.0 lb

## 2016-07-16 DIAGNOSIS — Z79899 Other long term (current) drug therapy: Secondary | ICD-10-CM

## 2016-07-16 DIAGNOSIS — B20 Human immunodeficiency virus [HIV] disease: Secondary | ICD-10-CM

## 2016-07-16 DIAGNOSIS — IMO0001 Reserved for inherently not codable concepts without codable children: Secondary | ICD-10-CM

## 2016-07-16 DIAGNOSIS — E349 Endocrine disorder, unspecified: Secondary | ICD-10-CM

## 2016-07-16 DIAGNOSIS — F640 Transsexualism: Secondary | ICD-10-CM

## 2016-07-16 DIAGNOSIS — Z113 Encounter for screening for infections with a predominantly sexual mode of transmission: Secondary | ICD-10-CM

## 2016-07-16 MED ORDER — MEDROXYPROGESTERONE ACETATE 5 MG PO TABS: ORAL_TABLET | ORAL | 3 refills | Status: DC

## 2016-07-16 NOTE — Assessment & Plan Note (Signed)
Flu shot today given condoms.  Doing well on current art.  Will see back in 6 months.

## 2016-07-16 NOTE — Assessment & Plan Note (Signed)
Will refill rs'x.

## 2016-07-16 NOTE — Progress Notes (Signed)
   Subjective:    Patient ID: Renee Malone, female    DOB: 11/01/1985, 30 y.o.   MRN: [id number]  HPI 30 yo M --> F with Dx HIV+ 06-17-09. Has been on atripla --> odefsy (10-2015). On estrogen injections, spironolactone. Awaiting gender reassignment surgery.  Today complains of heart palpitations, has cleansed self of caffeine/energy drinks. Since stopping energy drinks, no further episodes.   Has gained 40#. Attributes to increased appetite. Likes how he looks with wt on. Has been drinking lots of sports drinks.   Noted "lump on back beside my spine". Not painful, worse with not getting enough sleep.   HIV 1 RNA Quant (copies/mL)  Date Value  07/02/2016 <20  11/02/2015 <20  06/27/2015 <20   CD4 T Cell Abs (/uL)  Date Value  07/02/2016 930  11/02/2015 910  06/27/2015 630   No problems with new ART.    Review of Systems  Constitutional: Negative for appetite change and unexpected weight change.  Respiratory: Negative for shortness of breath.   Cardiovascular: Positive for palpitations. Negative for chest pain.  Gastrointestinal: Negative for constipation and diarrhea.  Genitourinary: Negative for difficulty urinating.       Objective:   Physical Exam  Constitutional: He appears well-developed and well-nourished.  HENT:  Mouth/Throat: No oropharyngeal exudate.  Eyes: EOM are normal. Pupils are equal, round, and reactive to light.  Neck: Neck supple.  Cardiovascular: Normal rate, regular rhythm and normal heart sounds.   Pulmonary/Chest: Effort normal and breath sounds normal.  Abdominal: Soft. Bowel sounds are normal. There is no tenderness. There is no rebound.  Musculoskeletal: He exhibits no edema.       Arms:         Assessment & Plan:

## 2016-08-03 ENCOUNTER — Other Ambulatory Visit: Payer: Self-pay | Admitting: Infectious Diseases

## 2016-08-03 DIAGNOSIS — E349 Endocrine disorder, unspecified: Secondary | ICD-10-CM

## 2016-08-03 DIAGNOSIS — F640 Transsexualism: Principal | ICD-10-CM

## 2016-08-03 DIAGNOSIS — IMO0001 Reserved for inherently not codable concepts without codable children: Secondary | ICD-10-CM

## 2016-08-30 ENCOUNTER — Other Ambulatory Visit: Payer: Self-pay | Admitting: Infectious Diseases

## 2016-10-17 ENCOUNTER — Emergency Department (HOSPITAL_BASED_OUTPATIENT_CLINIC_OR_DEPARTMENT_OTHER): Payer: Self-pay

## 2016-10-17 ENCOUNTER — Emergency Department (HOSPITAL_BASED_OUTPATIENT_CLINIC_OR_DEPARTMENT_OTHER)
Admission: EM | Admit: 2016-10-17 | Discharge: 2016-10-17 | Disposition: A | Discharge status: Home / Self Care | Payer: Self-pay | Attending: Emergency Medicine | Admitting: Emergency Medicine

## 2016-10-17 ENCOUNTER — Encounter (HOSPITAL_BASED_OUTPATIENT_CLINIC_OR_DEPARTMENT_OTHER): Payer: Self-pay

## 2016-10-17 DIAGNOSIS — R69 Illness, unspecified: Secondary | ICD-10-CM

## 2016-10-17 DIAGNOSIS — J111 Influenza due to unidentified influenza virus with other respiratory manifestations: Secondary | ICD-10-CM

## 2016-10-17 DIAGNOSIS — J069 Acute upper respiratory infection, unspecified: Secondary | ICD-10-CM | POA: Insufficient documentation

## 2016-10-17 DIAGNOSIS — Z21 Asymptomatic human immunodeficiency virus [HIV] infection status: Secondary | ICD-10-CM | POA: Insufficient documentation

## 2016-10-17 DIAGNOSIS — R059 Cough, unspecified: Secondary | ICD-10-CM

## 2016-10-17 DIAGNOSIS — R05 Cough: Secondary | ICD-10-CM

## 2016-10-17 NOTE — ED Triage Notes (Signed)
C/o prod cough x 3 days-NAD-steady gait 

## 2016-10-17 NOTE — ED Provider Notes (Signed)
[facility]   CSN: [id number] Arrival date & time: 10/17/16  1609     History   Chief Complaint Chief Complaint  Patient presents with  . Cough    HPI Renee Malone is a 31 y.o. female.  31 year old female that advises a female presents to the emergency department with a few days of cough that has been unchanged and moderate in severity. Patient states that has had likely fever during that time with some night sweats, body aches, sore throat and a slight headache. 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 Has not tried anything for the  Symptoms. No other associated or modifying factors.    Cough  This is a new problem.    Past Medical History:  Diagnosis Date  . HIV infection [facility])     Patient Active Problem List   Diagnosis Date Noted  . Rash and nonspecific skin eruption 11/02/2015  . Allergic rhinitis 01/28/2013  . Precordial chest pain 12/19/2012  . Tendonitis 06/11/2012  . Depression 01/08/2012  . Hormonal imbalance in transgender patient 05/22/2011  . Human immunodeficiency virus (HIV) disease (HCC) 07/21/2009    Past Surgical History:  Procedure Laterality Date  . SKIN GRAFT      OB History    No data available       Home Medications    Prior to Admission medications   Medication Sig Start Date End Date Taking? Authorizing Provider  acetaminophen (TYLENOL) 500 MG tablet Take 1,000 mg by mouth every 6 (six) hours as needed for moderate pain or fever.    Historical Provider, MD  ALPRAZolam Prudy Feeler) 1 MG tablet TAKE ONE TABLET BY MOUTH EVERY 8 HOURS AS NEEDED Patient not taking: Reported on 06/08/2015 01/10/12   Cliffton Asters, MD  emtricitabine-rilpivir-tenofovir AF (ODEFSEY) 200-25-25 MG TABS tablet Take 1 tablet by mouth daily with breakfast. 11/02/15   Ginnie Smart, MD  estradiol (CLIMARA - DOSED IN MG/24 HR) 0.025 mg/24hr patch PLACE 1 PATCH ONTO THE SKIN ONCE A WEEK 09/03/16    Ginnie Smart, MD  hydrocortisone 1 % ointment Apply 1 application topically 2 (two) times daily. Patient taking differently: Apply 1 application topically 2 (two) times daily as needed for itching.  11/02/15   Ginnie Smart, MD  medroxyPROGESTERone (PROVERA) 5 MG tablet Use as directed 07/16/16   Ginnie Smart, MD  spironolactone (ALDACTONE) 25 MG tablet TAKE 1 TABLET BY MOUTH TWICE DAILY 08/03/16   Ginnie Smart, MD    Family History Family History  Problem Relation Age of Onset  . Hypertension Maternal Grandmother     Social History Social History  Substance Use Topics  . Smoking status: Never Smoker  . Smokeless tobacco: Never Used  . Alcohol use 0.5 oz/week    1 Standard drinks or equivalent per week     Comment: rarely     Allergies   Broccoli [brassica oleracea italica]   Review of Systems Review of Systems  Respiratory: Positive for cough.   All other systems reviewed and are negative.    Physical Exam Updated Vital Signs BP 120/83 (BP Location: Left Arm)   Pulse 89   Temp 98.8 F (37.1 C) (Oral)   Resp 24   Ht 6' 3.75" (1.924 m)   Wt 199 lb 6.4 oz (90.4 kg)   SpO2 100%   BMI 24.43 kg/m   Physical Exam  Constitutional: He is oriented to person,  place, and time. He appears well-developed and well-nourished.  HENT:  Head: Normocephalic and atraumatic.  Eyes: Conjunctivae and EOM are normal.  Neck: Normal range of motion.  Cardiovascular: Normal rate.  Exam reveals no friction rub.   No murmur heard. Pulmonary/Chest: Effort normal. No respiratory distress.  Abdominal: He exhibits no distension.  Musculoskeletal: Normal range of motion.  Neurological: He is alert and oriented to person, place, and time. No cranial nerve deficit.  Nursing note and vitals reviewed.    ED Treatments / Results  Labs (all labs ordered are listed, but only abnormal results are displayed) Labs Reviewed - No data to display  EKG  EKG  Interpretation None       Radiology Dg Chest 2 View  Result Date: 10/17/2016 CLINICAL DATA:  Fever cough and congestion EXAM: CHEST  2 VIEW COMPARISON:  12/14/2015 FINDINGS: The heart size and mediastinal contours are within normal limits. Both lungs are clear. The visualized skeletal structures are unremarkable. IMPRESSION: No active cardiopulmonary disease. Electronically Signed   By: Jasmine PangKim  Fujinaga M.D.   On: 10/17/2016 17:08    Procedures Procedures (including critical care time)  Medications Ordered in ED Medications - No data to display   Initial Impression / Assessment and Plan / ED Course  I have reviewed the triage vital signs and the nursing notes.  Pertinent labs & imaging results that were available during my care of the patient were reviewed by me and considered in my medical decision making (see chart for details).  Clinical Course     Low suspicion of bacterial illness. Not immune compromised on recent pcp visit, has been taking meds, suspect he is still immune competent. Plan for dc with symptomatic care, no indication for abx at this time.   Final Clinical Impressions(s) / ED Diagnoses   Final diagnoses:  Cough  Upper respiratory tract infection, unspecified type  Influenza-like illness    New Prescriptions New Prescriptions   No medications on file     Marily MemosJason Srihitha Tagliaferri, MD 10/17/16 1807

## 2016-10-17 NOTE — ED Notes (Signed)
Pt reports pain in chest and ribs with cough and movement. Also intermittent pain shoots through her breasts

## 2016-10-31 ENCOUNTER — Other Ambulatory Visit: Payer: Self-pay

## 2016-11-03 ENCOUNTER — Other Ambulatory Visit: Payer: Self-pay | Admitting: Infectious Diseases

## 2016-11-03 DIAGNOSIS — B20 Human immunodeficiency virus [HIV] disease: Secondary | ICD-10-CM

## 2016-11-03 DIAGNOSIS — F640 Transsexualism: Secondary | ICD-10-CM

## 2016-11-03 DIAGNOSIS — E349 Endocrine disorder, unspecified: Secondary | ICD-10-CM

## 2016-11-03 DIAGNOSIS — IMO0001 Reserved for inherently not codable concepts without codable children: Secondary | ICD-10-CM

## 2016-11-05 ENCOUNTER — Other Ambulatory Visit: Payer: Self-pay | Admitting: Infectious Diseases

## 2016-11-05 ENCOUNTER — Telehealth: Payer: Self-pay | Admitting: *Deleted

## 2016-11-05 DIAGNOSIS — F640 Transsexualism: Principal | ICD-10-CM

## 2016-11-05 DIAGNOSIS — E349 Endocrine disorder, unspecified: Secondary | ICD-10-CM

## 2016-11-05 DIAGNOSIS — IMO0001 Reserved for inherently not codable concepts without codable children: Secondary | ICD-10-CM

## 2016-11-05 MED ORDER — SPIRONOLACTONE 25 MG PO TABS: 25.0000 mg | ORAL_TABLET | Freq: Two times a day (BID) | ORAL | 3 refills | Status: DC

## 2016-11-05 NOTE — Telephone Encounter (Signed)
Please clarify spironolactone prescription.  Written as 25mg , take 2/day, #30.   Andree CossHowell, Michelle M, RN

## 2016-11-14 ENCOUNTER — Encounter: Payer: Self-pay | Admitting: Infectious Diseases

## 2016-11-14 ENCOUNTER — Ambulatory Visit (INDEPENDENT_AMBULATORY_CARE_PROVIDER_SITE_OTHER): Payer: Self-pay | Admitting: Infectious Diseases

## 2016-11-14 VITALS — BP 118/74 | HR 71 | Temp 98.2°F | Ht 75.0 in | Wt 199.0 lb

## 2016-11-14 DIAGNOSIS — B20 Human immunodeficiency virus [HIV] disease: Secondary | ICD-10-CM

## 2016-11-14 DIAGNOSIS — IMO0001 Reserved for inherently not codable concepts without codable children: Secondary | ICD-10-CM

## 2016-11-14 DIAGNOSIS — Z113 Encounter for screening for infections with a predominantly sexual mode of transmission: Secondary | ICD-10-CM

## 2016-11-14 DIAGNOSIS — E349 Endocrine disorder, unspecified: Secondary | ICD-10-CM

## 2016-11-14 DIAGNOSIS — F640 Transsexualism: Secondary | ICD-10-CM

## 2016-11-14 DIAGNOSIS — Z79899 Other long term (current) drug therapy: Secondary | ICD-10-CM

## 2016-11-14 DIAGNOSIS — Z23 Encounter for immunization: Secondary | ICD-10-CM

## 2016-11-14 LAB — CBC
HEMATOCRIT: 43.6 % (ref 38.5–50.0)
HEMOGLOBIN: 14.6 g/dL (ref 13.2–17.1)
MCH: 30.5 pg (ref 27.0–33.0)
MCHC: 33.5 g/dL (ref 32.0–36.0)
MCV: 91.2 fL (ref 80.0–100.0)
MPV: 10.2 fL (ref 7.5–12.5)
Platelets: 266 10*3/uL (ref 140–400)
RBC: 4.78 MIL/uL (ref 4.20–5.80)
RDW: 13.4 % (ref 11.0–15.0)
WBC: 7 10*3/uL (ref 3.8–10.8)

## 2016-11-14 LAB — COMPREHENSIVE METABOLIC PANEL
ALBUMIN: 4.5 g/dL (ref 3.6–5.1)
ALK PHOS: 41 U/L (ref 40–115)
ALT: 11 U/L (ref 9–46)
AST: 15 U/L (ref 10–40)
BILIRUBIN TOTAL: 0.4 mg/dL (ref 0.2–1.2)
BUN: 13 mg/dL (ref 7–25)
CO2: 28 mmol/L (ref 20–31)
CREATININE: 1.26 mg/dL (ref 0.60–1.35)
Calcium: 9.5 mg/dL (ref 8.6–10.3)
Chloride: 103 mmol/L (ref 98–110)
Glucose, Bld: 83 mg/dL (ref 65–99)
Potassium: 4.3 mmol/L (ref 3.5–5.3)
SODIUM: 139 mmol/L (ref 135–146)
TOTAL PROTEIN: 7.5 g/dL (ref 6.1–8.1)

## 2016-11-14 LAB — LIPID PANEL
CHOL/HDL RATIO: 3.3 ratio (ref <5.0)
Cholesterol: 157 mg/dL (ref <200)
HDL: 47 mg/dL (ref >40)
LDL Cholesterol: 79 mg/dL (ref <100)
Triglycerides: 155 mg/dL — ABNORMAL HIGH (ref <150)
VLDL: 31 mg/dL — ABNORMAL HIGH (ref <30)

## 2016-11-14 NOTE — Assessment & Plan Note (Signed)
Mening vax today Check labs today Offered/refused condoms.  No change in ART.  Will see back in 6 months.

## 2016-11-14 NOTE — Progress Notes (Signed)
   Subjective:    Patient ID: Renee SilvasChristopher Courts, female    DOB: 12/13/1985, 31 y.o.   MRN: [id number]  HPI  31 yo M --> F with Dx HIV+ 06-17-09. Has been on atripla --> odefsy (10-2015). On medroxy-progesteron, estrogen patch, spironolactone. Awaiting gender reassignment surgery.   HIV 1 RNA Quant (copies/mL)  Date Value  07/02/2016 <20  11/02/2015 <20  06/27/2015 <20   CD4 T Cell Abs (/uL)  Date Value  07/02/2016 930  11/02/2015 910  06/27/2015 630   Has been doing well, working 3 jobs and going to school.  Feels well, occas neck pain.  Had breast pain, ache. Better although still some nipple tenderness.   Review of Systems  Constitutional: Negative for appetite change and unexpected weight change.  Respiratory: Negative for cough and shortness of breath.   Gastrointestinal: Negative for blood in stool and constipation.  Genitourinary: Negative for difficulty urinating.  Neurological: Positive for headaches. Negative for numbness.  walking delivering pizza. No formal exercise program.   Not sexually active.      Objective:   Physical Exam  Constitutional: He appears well-developed and well-nourished.  HENT:  Mouth/Throat: No oropharyngeal exudate.  Eyes: EOM are normal. Pupils are equal, round, and reactive to light.  Neck: Neck supple.  Cardiovascular: Normal rate, regular rhythm and normal heart sounds.   Pulmonary/Chest: Effort normal and breath sounds normal.  Abdominal: Soft. Bowel sounds are normal. There is no tenderness. There is no rebound.  Musculoskeletal: He exhibits no edema.       Arms: Lymphadenopathy:    He has no cervical adenopathy.          Assessment & Plan:

## 2016-11-14 NOTE — Assessment & Plan Note (Addendum)
Will continue current hormone therapy  Suspect breast issues due to this.  Saving money for re-assignment surgery.

## 2016-11-15 LAB — RPR

## 2016-11-15 LAB — T-HELPER CELL (CD4) - (RCID CLINIC ONLY)
CD4 % Helper T Cell: 45 % (ref 33–55)
CD4 T Cell Abs: 1060 /uL (ref 400–2700)

## 2016-11-16 NOTE — Addendum Note (Signed)
Addended by: Linnell FullingBRANNON, Mckinsey Keagle N on: 11/16/2016 09:04 AM   Modules accepted: Orders

## 2016-11-21 LAB — HIV-1 RNA QUANT-NO REFLEX-BLD
HIV 1 RNA Quant: <20 copies/mL — AB
HIV-1 RNA QUANT, LOG: DETECTED {Log_copies}/mL — AB

## 2016-11-26 ENCOUNTER — Encounter: Payer: Self-pay | Admitting: Infectious Diseases

## 2016-11-28 ENCOUNTER — Other Ambulatory Visit: Payer: Self-pay | Admitting: Infectious Diseases

## 2017-01-26 ENCOUNTER — Other Ambulatory Visit: Payer: Self-pay | Admitting: Infectious Diseases

## 2017-01-26 DIAGNOSIS — B20 Human immunodeficiency virus [HIV] disease: Secondary | ICD-10-CM

## 2017-03-26 ENCOUNTER — Other Ambulatory Visit: Payer: Self-pay | Admitting: Infectious Diseases

## 2017-03-26 DIAGNOSIS — IMO0001 Reserved for inherently not codable concepts without codable children: Secondary | ICD-10-CM

## 2017-03-26 DIAGNOSIS — E349 Endocrine disorder, unspecified: Secondary | ICD-10-CM

## 2017-03-26 DIAGNOSIS — F640 Transsexualism: Principal | ICD-10-CM

## 2017-06-27 ENCOUNTER — Encounter: Payer: Self-pay | Admitting: Infectious Diseases

## 2017-07-29 ENCOUNTER — Other Ambulatory Visit: Payer: Self-pay | Admitting: Infectious Diseases

## 2017-07-29 DIAGNOSIS — E349 Endocrine disorder, unspecified: Secondary | ICD-10-CM

## 2017-07-29 DIAGNOSIS — IMO0001 Reserved for inherently not codable concepts without codable children: Secondary | ICD-10-CM

## 2017-07-29 DIAGNOSIS — B20 Human immunodeficiency virus [HIV] disease: Secondary | ICD-10-CM

## 2017-07-29 DIAGNOSIS — F640 Transsexualism: Principal | ICD-10-CM

## 2017-08-21 ENCOUNTER — Ambulatory Visit: Payer: Self-pay | Admitting: Infectious Diseases

## 2017-08-26 ENCOUNTER — Encounter: Payer: Self-pay | Admitting: Infectious Diseases

## 2017-08-26 ENCOUNTER — Ambulatory Visit (INDEPENDENT_AMBULATORY_CARE_PROVIDER_SITE_OTHER): Payer: Self-pay | Admitting: Infectious Diseases

## 2017-08-26 VITALS — BP 117/77 | HR 69 | Temp 98.9°F | Wt 183.0 lb

## 2017-08-26 DIAGNOSIS — Z113 Encounter for screening for infections with a predominantly sexual mode of transmission: Secondary | ICD-10-CM

## 2017-08-26 DIAGNOSIS — Z79899 Other long term (current) drug therapy: Secondary | ICD-10-CM

## 2017-08-26 DIAGNOSIS — E349 Endocrine disorder, unspecified: Secondary | ICD-10-CM

## 2017-08-26 DIAGNOSIS — B20 Human immunodeficiency virus [HIV] disease: Secondary | ICD-10-CM

## 2017-08-26 DIAGNOSIS — IMO0001 Reserved for inherently not codable concepts without codable children: Secondary | ICD-10-CM

## 2017-08-26 DIAGNOSIS — F640 Transsexualism: Secondary | ICD-10-CM

## 2017-08-26 MED ORDER — SPIRONOLACTONE 50 MG PO TABS: 25.0000 mg | ORAL_TABLET | Freq: Two times a day (BID) | ORAL | 3 refills | Status: DC

## 2017-08-26 NOTE — Assessment & Plan Note (Signed)
Will increase spironolactone.  Is going to start looking at surgery options for gender re-assignment.

## 2017-08-26 NOTE — Assessment & Plan Note (Signed)
Has signed up for [employer]. Needs PCP.  Refuses flu vax Check labs today Offered/refused condoms.  rtc in 9 months.

## 2017-08-26 NOTE — Progress Notes (Signed)
   Subjective:    Patient ID: Renee Malone, adult    DOB: 02/29/1986, 31 y.o.   MRN: [id number]  HPI 31yo M -->F with Dx HIV+ 06-17-09. Has been on atripla --> odefsy (10-2015). On medroxy-progesteron, estrogen patch, spironolactone. Awaiting gender reassignment surgery.   HIV 1 RNA Quant (copies/mL)  Date Value  11/14/2016 <20 DETECTED (A)  07/02/2016 <20  11/02/2015 <20   CD4 T Cell Abs (/uL)  Date Value  11/14/2016 1,060  07/02/2016 930  11/02/2015 910   Has been busy last few weeks. Very stressed.  No problems with odefsy.  No problems with hormone therapy. His spironolactone dose was decreased to 25mg  bid. Since has had increased sex drive.   Sleeping more.   Review of Systems  Constitutional: Negative for appetite change, chills, fever and unexpected weight change.  Gastrointestinal: Negative for constipation and diarrhea.  Genitourinary: Negative for difficulty urinating.  Neurological: Positive for headaches.  Psychiatric/Behavioral: Positive for sleep disturbance.  Please see HPI. All other systems reviewed and negative.      Objective:   Physical Exam  Constitutional: She appears well-developed and well-nourished.  HENT:  Mouth/Throat: No oropharyngeal exudate.  Eyes: EOM are normal. Pupils are equal, round, and reactive to light.  Neck: Neck supple.  Cardiovascular: Normal rate, regular rhythm and normal heart sounds.  Pulmonary/Chest: Effort normal and breath sounds normal.  Abdominal: Soft. Bowel sounds are normal. There is no tenderness. There is no rebound.  Musculoskeletal: She exhibits no edema.  Lymphadenopathy:    She has no cervical adenopathy.  Psychiatric: She has a normal mood and affect.      Assessment & Plan:

## 2017-08-27 LAB — COMPREHENSIVE METABOLIC PANEL
AG RATIO: 1.6 (calc) (ref 1.0–2.5)
ALKALINE PHOSPHATASE (APISO): 36 U/L — AB (ref 40–115)
ALT: 8 U/L — ABNORMAL LOW (ref 9–46)
AST: 14 U/L (ref 10–40)
Albumin: 4.6 g/dL (ref 3.6–5.1)
BILIRUBIN TOTAL: 0.6 mg/dL (ref 0.2–1.2)
BUN: 10 mg/dL (ref 7–25)
CHLORIDE: 103 mmol/L (ref 98–110)
CO2: 30 mmol/L (ref 20–32)
CREATININE: 1.28 mg/dL (ref 0.60–1.35)
Calcium: 9.9 mg/dL (ref 8.6–10.3)
GLOBULIN: 2.8 g/dL (ref 1.9–3.7)
Glucose, Bld: 92 mg/dL (ref 65–99)
Potassium: 4.1 mmol/L (ref 3.5–5.3)
Sodium: 141 mmol/L (ref 135–146)
Total Protein: 7.4 g/dL (ref 6.1–8.1)

## 2017-08-27 LAB — LIPID PANEL
CHOLESTEROL: 149 mg/dL (ref <200)
HDL: 57 mg/dL (ref >40)
LDL Cholesterol (Calc): 79 mg/dL (calc)
Non-HDL Cholesterol (Calc): 92 mg/dL (calc) (ref <130)
Total CHOL/HDL Ratio: 2.6 (calc) (ref <5.0)
Triglycerides: 52 mg/dL (ref <150)

## 2017-08-27 LAB — CBC
HEMATOCRIT: 42.5 % (ref 38.5–50.0)
Hemoglobin: 14.5 g/dL (ref 13.2–17.1)
MCH: 31 pg (ref 27.0–33.0)
MCHC: 34.1 g/dL (ref 32.0–36.0)
MCV: 90.8 fL (ref 80.0–100.0)
MPV: 10.9 fL (ref 7.5–12.5)
PLATELETS: 258 10*3/uL (ref 140–400)
RBC: 4.68 10*6/uL (ref 4.20–5.80)
RDW: 12.6 % (ref 11.0–15.0)
WBC: 7.6 10*3/uL (ref 3.8–10.8)

## 2017-08-27 LAB — RPR: RPR Ser Ql: NONREACTIVE

## 2017-08-28 ENCOUNTER — Other Ambulatory Visit: Payer: Self-pay | Admitting: Infectious Diseases

## 2017-08-28 DIAGNOSIS — IMO0001 Reserved for inherently not codable concepts without codable children: Secondary | ICD-10-CM

## 2017-08-28 DIAGNOSIS — E349 Endocrine disorder, unspecified: Secondary | ICD-10-CM

## 2017-08-28 DIAGNOSIS — B20 Human immunodeficiency virus [HIV] disease: Secondary | ICD-10-CM

## 2017-08-28 DIAGNOSIS — F640 Transsexualism: Principal | ICD-10-CM

## 2017-08-28 LAB — HIV-1 RNA QUANT-NO REFLEX-BLD
HIV 1 RNA Quant: <20 copies/mL — AB
HIV-1 RNA QUANT, LOG: DETECTED {Log_copies}/mL — AB

## 2017-08-28 LAB — URINE CYTOLOGY ANCILLARY ONLY
Chlamydia: NEGATIVE
NEISSERIA GONORRHEA: NEGATIVE

## 2017-08-28 LAB — T-HELPER CELL (CD4) - (RCID CLINIC ONLY)
CD4 % Helper T Cell: 33 % (ref 33–55)
CD4 T Cell Abs: 1220 /uL (ref 400–2700)

## 2017-09-04 ENCOUNTER — Telehealth: Payer: Self-pay | Admitting: Pharmacist Clinician (PhC)/ Clinical Pharmacy Specialist

## 2017-09-04 ENCOUNTER — Telehealth: Payer: Self-pay | Admitting: *Deleted

## 2017-09-04 NOTE — Telephone Encounter (Signed)
Myrna called triage to ask questions about his kidney function and Odefsey. Explained to him that he is on Odefsey due to its safety profile to his kidney, especially, since he has some trend of CRI.

## 2017-09-04 NOTE — Telephone Encounter (Signed)
Patient called asking about her "kidney labs". She stated that at the last visit Dr. Ninetta LightsHatcher said it was worsening a little bit. Asked if she had started any new hiv meds and she said no. I did look at last labs from October and didn't see anything concerning and nothing mentioned in MD note. [facility], pharmacist for clarification.

## 2017-09-21 ENCOUNTER — Other Ambulatory Visit: Payer: Self-pay | Admitting: Infectious Diseases

## 2017-09-21 DIAGNOSIS — F640 Transsexualism: Principal | ICD-10-CM

## 2017-09-21 DIAGNOSIS — IMO0001 Reserved for inherently not codable concepts without codable children: Secondary | ICD-10-CM

## 2017-09-21 DIAGNOSIS — E349 Endocrine disorder, unspecified: Secondary | ICD-10-CM

## 2017-10-23 ENCOUNTER — Ambulatory Visit: Payer: Self-pay

## 2017-10-28 ENCOUNTER — Other Ambulatory Visit: Payer: Self-pay | Admitting: *Deleted

## 2017-10-28 DIAGNOSIS — N6310 Unspecified lump in the right breast, unspecified quadrant: Secondary | ICD-10-CM

## 2017-10-30 ENCOUNTER — Other Ambulatory Visit: Payer: Self-pay | Admitting: Infectious Diseases

## 2017-10-30 DIAGNOSIS — E349 Endocrine disorder, unspecified: Secondary | ICD-10-CM

## 2017-10-30 DIAGNOSIS — IMO0001 Reserved for inherently not codable concepts without codable children: Secondary | ICD-10-CM

## 2017-10-30 DIAGNOSIS — F640 Transsexualism: Principal | ICD-10-CM

## 2017-11-01 ENCOUNTER — Other Ambulatory Visit: Payer: Self-pay

## 2017-11-06 ENCOUNTER — Ambulatory Visit
Admission: RE | Admit: 2017-11-06 | Discharge: 2017-11-06 | Disposition: A | Discharge status: Home / Self Care | Payer: BLUE CROSS/BLUE SHIELD | Source: Ambulatory Visit | Attending: *Deleted | Admitting: *Deleted

## 2017-11-06 DIAGNOSIS — N6310 Unspecified lump in the right breast, unspecified quadrant: Secondary | ICD-10-CM

## 2017-11-12 ENCOUNTER — Encounter: Payer: Self-pay | Admitting: Infectious Diseases

## 2017-11-26 ENCOUNTER — Encounter: Payer: Self-pay | Admitting: Infectious Diseases

## 2017-12-16 ENCOUNTER — Encounter: Payer: Self-pay | Admitting: Infectious Diseases

## 2017-12-16 ENCOUNTER — Other Ambulatory Visit: Payer: Self-pay

## 2017-12-16 ENCOUNTER — Ambulatory Visit (INDEPENDENT_AMBULATORY_CARE_PROVIDER_SITE_OTHER): Payer: BLUE CROSS/BLUE SHIELD | Admitting: Infectious Diseases

## 2017-12-16 DIAGNOSIS — E349 Endocrine disorder, unspecified: Secondary | ICD-10-CM

## 2017-12-16 DIAGNOSIS — B20 Human immunodeficiency virus [HIV] disease: Secondary | ICD-10-CM | POA: Diagnosis not present

## 2017-12-16 DIAGNOSIS — Z23 Encounter for immunization: Secondary | ICD-10-CM | POA: Diagnosis not present

## 2017-12-16 DIAGNOSIS — F640 Transsexualism: Secondary | ICD-10-CM | POA: Diagnosis not present

## 2017-12-16 DIAGNOSIS — IMO0001 Reserved for inherently not codable concepts without codable children: Secondary | ICD-10-CM

## 2017-12-16 NOTE — Assessment & Plan Note (Addendum)
Doing well In stable relationship.  Will check labs at f/u visits CD4 should not be an issue with wound healing.  rtc in 4 months.  I spent a total of 30 minutes with this patient. Greater than 50% of this time was spent counseling and coordinating care for this patient.

## 2017-12-16 NOTE — Progress Notes (Signed)
   Subjective:    Patient ID: Renee Malone, adult    DOB: 09/29/1986, 32 y.o.   MRN: [id number]  HPI 31yo M -->F with Dx HIV+ 06-17-09. Has been on atripla --> odefsy (10-2015). Takes +/- with food.  Onmedroxy-progesteron, estrogen patch, spironolactone. Awaiting gender reassignment surgery. 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  Has found surgeon to work with gender re-assignment surgery. Going to see behavrioral therapist on 12-19-17. Has been getting laser hair removal.  In stable relationship with M.   HIV 1 RNA Quant (copies/mL)  Date Value  08/26/2017 <20 DETECTED (A)  11/14/2016 <20 DETECTED (A)  07/02/2016 <20   CD4 T Cell Abs (/uL)  Date Value  08/26/2017 1,220  11/14/2016 1,060  07/02/2016 930    Review of Systems  Constitutional: Negative for appetite change and unexpected weight change.  Gastrointestinal: Negative for constipation and diarrhea.  Genitourinary: Negative for difficulty urinating.  Psychiatric/Behavioral: Negative for dysphoric mood and sleep disturbance.  Please see HPI. All other systems reviewed and negative.      Objective:   Physical Exam  Constitutional: She appears well-developed and well-nourished.  HENT:  Mouth/Throat: No oropharyngeal exudate.  Eyes: EOM are normal. Pupils are equal, round, and reactive to light.  Neck: Neck supple.  Cardiovascular: Normal rate, regular rhythm and normal heart sounds.  Pulmonary/Chest: Effort normal and breath sounds normal.  Abdominal: Soft. Bowel sounds are normal. There is no tenderness. There is no rebound.  Musculoskeletal: She exhibits no edema.  Lymphadenopathy:    She has no cervical adenopathy.  Psychiatric: She has a normal mood and affect.       Assessment & Plan:

## 2017-12-16 NOTE — Addendum Note (Signed)
Addended by: Dashay Giesler C on: 12/16/2017 12:17 PM   Modules accepted: Level of Service

## 2017-12-16 NOTE — Assessment & Plan Note (Signed)
Working on getting surgery Will write letter in support of this.

## 2018-01-16 ENCOUNTER — Other Ambulatory Visit: Payer: Self-pay | Admitting: Infectious Diseases

## 2018-01-16 DIAGNOSIS — E349 Endocrine disorder, unspecified: Secondary | ICD-10-CM

## 2018-01-16 DIAGNOSIS — IMO0001 Reserved for inherently not codable concepts without codable children: Secondary | ICD-10-CM

## 2018-01-16 DIAGNOSIS — F640 Transsexualism: Principal | ICD-10-CM

## 2018-02-10 NOTE — H&P (Signed)
Subjective:     Patient ID: Renee Malone is a 32 y.o. female.  HPI  Self referred female that has identified as female since age 7, goes by "[name]." States she left her parents' home at age 70 and has lived as female since that time. She is followed by Dr. Ninetta Lights for HIV infection diagnosed 06/2009, viral counts undetectable in first few months of treatment, this has been for years. Has been on hormonal therapy under supervision Dr. Ninetta Lights since age 63.   Had prior silicone injections to chest by friend in 2006. States she had fever and PNA immediately post injections but has not required any surgery for this or tissue loss/infection. US/MMG recently obtained as below.  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 Has had prior consult with Dr. Benna Dunks.  Notes she is participating in [employer] on Trans health this spring.   Goal would be full C small D.   Maternal great GM with breast ca.  CLINICAL DATA: Patient presents with a right breast lump, which been noted years. Patient is transsexual, and had breast silicone injections performed in the past.  EXAM: 2D DIGITAL DIAGNOSTIC BILATERAL MAMMOGRAM WITH CAD AND ADJUNCT TOMO  ULTRASOUND RIGHT BREAST  COMPARISON: None  ACR Breast Density Category b: There are scattered areas of fibroglandular density.  FINDINGS: There are numerous bilateral silicone granulomas a multiple small circumscribed masses. The right breast, corresponding to the palpable abnormality, there is largest silicone density mass, which is oval and circumscribed in uniform in density.  There are no masses that are suspicious breast carcinoma. No areas of architectural distortion. No suspicious calcifications.  Mammographic images were processed with CAD.  On physical exam, there is a smooth mobile mass in the outer right breast.  Targeted ultrasound is performed, showing a complicated cyst in  the right breast the 11:30 o'clock position, 2 cm the nipple, measuring 3.4 x 3.4 x 3.8 cm. The anterior and lateral margins cyst show increased echogenicity posterior acoustic shadowing consistent silicone. The cyst is anechoic centrally with increased through transmission. Findings are consistent with a cyst associated with a silicone granuloma.  IMPRESSION: 1. No evidence of breast malignancy. 2. The palpable abnormality is a cystic silicone granuloma. There are numerous smaller silicone granulomas of both breasts.  RECOMMENDATION: There is no specific consensus for breast cancer screening for transgender women. The literature does recommend annual or biannual mammography beginning age 68 for transgender women who have taken estrogen treatment for least 5 years.  I have discussed the findings and recommendations with the patient. Results were also provided in writing at the conclusion of the visit. If applicable, a reminder letter will be sent to the patient regarding the next appointment.  BI-RADS CATEGORY 2: Benign.   Electronically Signed By: Amie Portland M.D. On: 11/06/2017 15:21  Review of Systems     Objective:   Physical Exam  Constitutional: He is oriented to person, place, and time.  Cardiovascular: Normal rate.   Pulmonary/Chest: Effort normal.  Neurological: He is alert and oriented to person, place, and time.  Skin:  Fitzpatrick 5   Soft tissue pinch 4 cm on left 3. 5 cm on right Asymmetry fold with left lower SN to nipple R 21 L 21 cm BW R14 L 14 cm Nipple to IMF R 5 L 6 cm Palpable mass right upper breast - consistent with silicone granuloma as noted on [place] No other palpable masses    Assessment:  Gender dysphoria History free silicone injections chest, silicone granulomas    Plan:      Patient has identified and lived as female for over 10 years and completed hormonal therapy for 8 years. She appears to be good candidate for  augmentation and good understanding of risks of procedure.  Plan IMF incisions, dual plane position. Discussed saline vs silicone, MRI surveillance of latter, rippling that may be more severe with saline implants, risks rupture, capsular contracture. Discussed smooth vs textured, ALCL risk with latter. Would recommend excision of the palpable mass over right breast as well. Discussed post procedure limitations.  Provided [place] handout. Reviewed and signed ASPS silicone breast augmentation consent. Patient herself has been reading about CC and ALCL. Plan smooth round. Reviewed additional risks including but not limited to wound healing problems, asymmetry need for additional procedures, animation, unacceptable cosmetic result, DVT/PE, damage to deeper structures, cardiopulmonary complications. New pictures today, patient reports some increased breast tissue growth and I agree.   Rx for Norco, Bactrim and Robaxin given. Plan OP surgery, do not anticipate drains.  Glenna Fellows, MD [facility] Plastic & Reconstructive Surgery [phone], pin [phone]

## 2018-02-17 ENCOUNTER — Other Ambulatory Visit: Payer: Self-pay

## 2018-02-17 ENCOUNTER — Encounter (HOSPITAL_BASED_OUTPATIENT_CLINIC_OR_DEPARTMENT_OTHER): Payer: Self-pay | Admitting: *Deleted

## 2018-02-18 ENCOUNTER — Encounter (HOSPITAL_BASED_OUTPATIENT_CLINIC_OR_DEPARTMENT_OTHER)
Admission: RE | Admit: 2018-02-18 | Discharge: 2018-02-18 | Disposition: A | Discharge status: Home / Self Care | Payer: BLUE CROSS/BLUE SHIELD | Source: Ambulatory Visit | Attending: Plastic Surgery | Admitting: Plastic Surgery

## 2018-02-18 DIAGNOSIS — Z01818 Encounter for other preprocedural examination: Secondary | ICD-10-CM | POA: Insufficient documentation

## 2018-02-18 DIAGNOSIS — B20 Human immunodeficiency virus [HIV] disease: Secondary | ICD-10-CM | POA: Insufficient documentation

## 2018-02-18 LAB — BASIC METABOLIC PANEL
ANION GAP: 6 (ref 5–15)
BUN: 13 mg/dL (ref 6–20)
CALCIUM: 9.5 mg/dL (ref 8.9–10.3)
CO2: 28 mmol/L (ref 22–32)
Chloride: 104 mmol/L (ref 101–111)
Creatinine, Ser: 1.2 mg/dL (ref 0.61–1.24)
Glucose, Bld: 83 mg/dL (ref 65–99)
Potassium: 4.9 mmol/L (ref 3.5–5.1)
SODIUM: 138 mmol/L (ref 135–145)

## 2018-02-18 NOTE — Progress Notes (Signed)
Ensure pre surgery drink given with instructions to complete by 0600 dos, pt verbalized understanding. 

## 2018-02-25 ENCOUNTER — Ambulatory Visit (HOSPITAL_BASED_OUTPATIENT_CLINIC_OR_DEPARTMENT_OTHER): Payer: BLUE CROSS/BLUE SHIELD | Admitting: Anesthesiology

## 2018-02-25 ENCOUNTER — Encounter (HOSPITAL_BASED_OUTPATIENT_CLINIC_OR_DEPARTMENT_OTHER)
Admission: RE | Disposition: A | Discharge status: Home / Self Care | Payer: Self-pay | Source: Ambulatory Visit | Attending: Plastic Surgery

## 2018-02-25 ENCOUNTER — Encounter (HOSPITAL_BASED_OUTPATIENT_CLINIC_OR_DEPARTMENT_OTHER): Payer: Self-pay | Admitting: Anesthesiology

## 2018-02-25 ENCOUNTER — Ambulatory Visit (HOSPITAL_BASED_OUTPATIENT_CLINIC_OR_DEPARTMENT_OTHER)
Admission: RE | Admit: 2018-02-25 | Discharge: 2018-02-25 | Disposition: A | Discharge status: Home / Self Care | Payer: BLUE CROSS/BLUE SHIELD | Source: Ambulatory Visit | Attending: Plastic Surgery | Admitting: Plastic Surgery

## 2018-02-25 ENCOUNTER — Other Ambulatory Visit: Payer: Self-pay

## 2018-02-25 DIAGNOSIS — M6028 Foreign body granuloma of soft tissue, not elsewhere classified, other site: Secondary | ICD-10-CM | POA: Insufficient documentation

## 2018-02-25 DIAGNOSIS — Z79890 Hormone replacement therapy (postmenopausal): Secondary | ICD-10-CM | POA: Insufficient documentation

## 2018-02-25 DIAGNOSIS — Z411 Encounter for cosmetic surgery: Secondary | ICD-10-CM | POA: Insufficient documentation

## 2018-02-25 DIAGNOSIS — Z79899 Other long term (current) drug therapy: Secondary | ICD-10-CM | POA: Diagnosis not present

## 2018-02-25 DIAGNOSIS — B20 Human immunodeficiency virus [HIV] disease: Secondary | ICD-10-CM | POA: Insufficient documentation

## 2018-02-25 DIAGNOSIS — F640 Transsexualism: Secondary | ICD-10-CM | POA: Insufficient documentation

## 2018-02-25 DIAGNOSIS — F418 Other specified anxiety disorders: Secondary | ICD-10-CM | POA: Insufficient documentation

## 2018-02-25 DIAGNOSIS — N6310 Unspecified lump in the right breast, unspecified quadrant: Secondary | ICD-10-CM | POA: Diagnosis present

## 2018-02-25 DIAGNOSIS — Z1889 Other specified retained foreign body fragments: Secondary | ICD-10-CM | POA: Diagnosis not present

## 2018-02-25 HISTORY — PX: BREAST LUMPECTOMY: SHX2

## 2018-02-25 HISTORY — PX: BREAST ENHANCEMENT SURGERY: SHX7

## 2018-02-25 HISTORY — DX: Anxiety disorder, unspecified: F41.9

## 2018-02-25 HISTORY — DX: Cardiac murmur, unspecified: R01.1

## 2018-02-25 SURGERY — AUGMENTATION, BREAST
Anesthesia: General | Site: Breast | Laterality: Right

## 2018-02-25 MED ORDER — DEXAMETHASONE SODIUM PHOSPHATE 10 MG/ML IJ SOLN
INTRAMUSCULAR | Status: AC
  Filled 2018-02-25: qty 1

## 2018-02-25 MED ORDER — LIDOCAINE HCL (CARDIAC) PF 100 MG/5ML IV SOSY
PREFILLED_SYRINGE | INTRAVENOUS | Status: DC | PRN
  Administered 2018-02-25: 30 mg via INTRAVENOUS

## 2018-02-25 MED ORDER — DEXAMETHASONE SODIUM PHOSPHATE 4 MG/ML IJ SOLN
INTRAMUSCULAR | Status: DC | PRN
  Administered 2018-02-25: 10 mg via INTRAVENOUS

## 2018-02-25 MED ORDER — FENTANYL CITRATE (PF) 100 MCG/2ML IJ SOLN
INTRAMUSCULAR | Status: DC | PRN
  Administered 2018-02-25: 100 ug via INTRAVENOUS

## 2018-02-25 MED ORDER — PHENYLEPHRINE HCL 10 MG/ML IJ SOLN
INTRAMUSCULAR | Status: DC | PRN
  Administered 2018-02-25 (×5): 80 ug via INTRAVENOUS

## 2018-02-25 MED ORDER — CHLORHEXIDINE GLUCONATE CLOTH 2 % EX PADS: 6.0000 | MEDICATED_PAD | Freq: Once | CUTANEOUS | Status: DC

## 2018-02-25 MED ORDER — PROPOFOL 10 MG/ML IV BOLUS
INTRAVENOUS | Status: DC | PRN
  Administered 2018-02-25: 200 mg via INTRAVENOUS

## 2018-02-25 MED ORDER — EPHEDRINE SULFATE 50 MG/ML IJ SOLN
INTRAMUSCULAR | Status: DC | PRN
  Administered 2018-02-25: 10 mg via INTRAVENOUS

## 2018-02-25 MED ORDER — ONDANSETRON HCL 4 MG/2ML IJ SOLN
INTRAMUSCULAR | Status: AC
  Filled 2018-02-25: qty 2

## 2018-02-25 MED ORDER — MIDAZOLAM HCL 2 MG/2ML IJ SOLN
INTRAMUSCULAR | Status: AC
  Filled 2018-02-25: qty 2

## 2018-02-25 MED ORDER — ACETAMINOPHEN 500 MG PO TABS
1000.0000 mg | ORAL_TABLET | ORAL | Status: AC
  Administered 2018-02-25: 1000 mg via ORAL

## 2018-02-25 MED ORDER — SUCCINYLCHOLINE CHLORIDE 200 MG/10ML IV SOSY
PREFILLED_SYRINGE | INTRAVENOUS | Status: AC
  Filled 2018-02-25: qty 10

## 2018-02-25 MED ORDER — MEPERIDINE HCL 25 MG/ML IJ SOLN: 6.2500 mg | INTRAMUSCULAR | Status: DC | PRN

## 2018-02-25 MED ORDER — ACETAMINOPHEN 160 MG/5ML PO SOLN: 325.0000 mg | ORAL | Status: DC | PRN

## 2018-02-25 MED ORDER — LACTATED RINGERS IV SOLN
INTRAVENOUS | Status: DC
  Administered 2018-02-25 (×3): via INTRAVENOUS

## 2018-02-25 MED ORDER — FENTANYL CITRATE (PF) 100 MCG/2ML IJ SOLN
INTRAMUSCULAR | Status: AC
  Filled 2018-02-25: qty 2

## 2018-02-25 MED ORDER — ACETAMINOPHEN 325 MG PO TABS: 325.0000 mg | ORAL_TABLET | ORAL | Status: DC | PRN

## 2018-02-25 MED ORDER — SCOPOLAMINE 1 MG/3DAYS TD PT72: 1.0000 | MEDICATED_PATCH | Freq: Once | TRANSDERMAL | Status: DC | PRN

## 2018-02-25 MED ORDER — PHENYLEPHRINE HCL 10 MG/ML IJ SOLN
INTRAMUSCULAR | Status: AC
  Filled 2018-02-25: qty 1

## 2018-02-25 MED ORDER — FENTANYL CITRATE (PF) 100 MCG/2ML IJ SOLN: 50.0000 ug | INTRAMUSCULAR | Status: DC | PRN

## 2018-02-25 MED ORDER — GABAPENTIN 300 MG PO CAPS
ORAL_CAPSULE | ORAL | Status: AC
  Filled 2018-02-25: qty 1

## 2018-02-25 MED ORDER — SODIUM CHLORIDE 0.9 % IV SOLN
INTRAVENOUS | Status: DC | PRN
  Administered 2018-02-25: 1000 mL

## 2018-02-25 MED ORDER — CEFAZOLIN SODIUM-DEXTROSE 2-4 GM/100ML-% IV SOLN
INTRAVENOUS | Status: AC
  Filled 2018-02-25: qty 100

## 2018-02-25 MED ORDER — OXYCODONE HCL 5 MG PO TABS: 5.0000 mg | ORAL_TABLET | Freq: Once | ORAL | Status: DC | PRN

## 2018-02-25 MED ORDER — GABAPENTIN 300 MG PO CAPS
300.0000 mg | ORAL_CAPSULE | ORAL | Status: AC
  Administered 2018-02-25: 300 mg via ORAL

## 2018-02-25 MED ORDER — CEFAZOLIN SODIUM-DEXTROSE 2-4 GM/100ML-% IV SOLN
2.0000 g | INTRAVENOUS | Status: AC
  Administered 2018-02-25: 2 g via INTRAVENOUS

## 2018-02-25 MED ORDER — SODIUM CHLORIDE 0.9 % IV SOLN
INTRAVENOUS | Status: DC | PRN
  Administered 2018-02-25: 40 mL

## 2018-02-25 MED ORDER — CELECOXIB 200 MG PO CAPS
ORAL_CAPSULE | ORAL | Status: AC
  Filled 2018-02-25: qty 1

## 2018-02-25 MED ORDER — PROPOFOL 10 MG/ML IV BOLUS
INTRAVENOUS | Status: AC
Start: 2018-02-25 — End: 2018-02-25
  Filled 2018-02-25: qty 20

## 2018-02-25 MED ORDER — MIDAZOLAM HCL 5 MG/5ML IJ SOLN
INTRAMUSCULAR | Status: DC | PRN
  Administered 2018-02-25: 2 mg via INTRAVENOUS

## 2018-02-25 MED ORDER — SUCCINYLCHOLINE CHLORIDE 20 MG/ML IJ SOLN
INTRAMUSCULAR | Status: DC | PRN
  Administered 2018-02-25: 50 mg via INTRAVENOUS

## 2018-02-25 MED ORDER — LIDOCAINE HCL (CARDIAC) PF 100 MG/5ML IV SOSY
PREFILLED_SYRINGE | INTRAVENOUS | Status: AC
  Filled 2018-02-25: qty 5

## 2018-02-25 MED ORDER — MIDAZOLAM HCL 2 MG/2ML IJ SOLN: 1.0000 mg | INTRAMUSCULAR | Status: DC | PRN

## 2018-02-25 MED ORDER — OXYCODONE HCL 5 MG/5ML PO SOLN: 5.0000 mg | Freq: Once | ORAL | Status: DC | PRN

## 2018-02-25 MED ORDER — FENTANYL CITRATE (PF) 100 MCG/2ML IJ SOLN
25.0000 ug | INTRAMUSCULAR | Status: DC | PRN
  Administered 2018-02-25: 25 ug via INTRAVENOUS
  Administered 2018-02-25: 50 ug via INTRAVENOUS

## 2018-02-25 MED ORDER — BUPIVACAINE LIPOSOME 1.3 % IJ SUSP
INTRAMUSCULAR | Status: AC
  Filled 2018-02-25: qty 20

## 2018-02-25 MED ORDER — CELECOXIB 200 MG PO CAPS
200.0000 mg | ORAL_CAPSULE | ORAL | Status: AC
  Administered 2018-02-25: 200 mg via ORAL

## 2018-02-25 MED ORDER — ACETAMINOPHEN 500 MG PO TABS
ORAL_TABLET | ORAL | Status: AC
  Filled 2018-02-25: qty 2

## 2018-02-25 MED ORDER — ONDANSETRON HCL 4 MG/2ML IJ SOLN: 4.0000 mg | Freq: Once | INTRAMUSCULAR | Status: DC | PRN

## 2018-02-25 MED ORDER — GLYCOPYRROLATE 0.2 MG/ML IJ SOLN
INTRAMUSCULAR | Status: DC | PRN
  Administered 2018-02-25: 0.1 mg via INTRAVENOUS

## 2018-02-25 MED ORDER — ONDANSETRON HCL 4 MG/2ML IJ SOLN
INTRAMUSCULAR | Status: DC | PRN
  Administered 2018-02-25: 4 mg via INTRAVENOUS

## 2018-02-25 SURGICAL SUPPLY — 57 items
ADH SKN CLS APL DERMABOND .7 (GAUZE/BANDAGES/DRESSINGS) ×4
BAG DECANTER FOR FLEXI CONT (MISCELLANEOUS) ×4 IMPLANT
BINDER BREAST XLRG (GAUZE/BANDAGES/DRESSINGS) ×2 IMPLANT
BLADE SURG 10 STRL SS (BLADE) ×12 IMPLANT
BLADE SURG 15 STRL LF DISP TIS (BLADE) ×2 IMPLANT
BLADE SURG 15 STRL SS (BLADE) ×4
BNDG GAUZE ELAST 4 BULKY (GAUZE/BANDAGES/DRESSINGS) ×8 IMPLANT
CANISTER SUCT 1200ML W/VALVE (MISCELLANEOUS) ×4 IMPLANT
CHLORAPREP W/TINT 26ML (MISCELLANEOUS) ×6 IMPLANT
COVER BACK TABLE 60X90IN (DRAPES) ×4 IMPLANT
COVER MAYO STAND STRL (DRAPES) ×4 IMPLANT
DERMABOND ADVANCED (GAUZE/BANDAGES/DRESSINGS) ×4
DERMABOND ADVANCED .7 DNX12 (GAUZE/BANDAGES/DRESSINGS) ×2 IMPLANT
DRAPE TOP ARMCOVERS (MISCELLANEOUS) ×4 IMPLANT
DRAPE U-SHAPE 76X120 STRL (DRAPES) ×4 IMPLANT
DRSG PAD ABDOMINAL 8X10 ST (GAUZE/BANDAGES/DRESSINGS) ×8 IMPLANT
DRSG TEGADERM 2-3/8X2-3/4 SM (GAUZE/BANDAGES/DRESSINGS) ×4 IMPLANT
ELECT BLADE 4.0 EZ CLEAN MEGAD (MISCELLANEOUS) ×4
ELECT COATED BLADE 2.86 ST (ELECTRODE) ×4 IMPLANT
ELECT REM PT RETURN 9FT ADLT (ELECTROSURGICAL) ×4
ELECTRODE BLDE 4.0 EZ CLN MEGD (MISCELLANEOUS) ×2 IMPLANT
ELECTRODE REM PT RTRN 9FT ADLT (ELECTROSURGICAL) ×2 IMPLANT
GLOVE BIO SURGEON STRL SZ 6 (GLOVE) ×8 IMPLANT
GLOVE BIOGEL PI IND STRL 7.0 (GLOVE) IMPLANT
GLOVE BIOGEL PI INDICATOR 7.0 (GLOVE) ×4
GLOVE SURG SS PI 6.5 STRL IVOR (GLOVE) ×2 IMPLANT
GOWN STRL REUS W/ TWL LRG LVL3 (GOWN DISPOSABLE) ×4 IMPLANT
GOWN STRL REUS W/TWL LRG LVL3 (GOWN DISPOSABLE) ×8
IMPL BREAST MP GEL 375CC (Breast) IMPLANT
IMPLANT BREAST MP GEL 375CC (Breast) ×8 IMPLANT
NDL HYPO 25X1 1.5 SAFETY (NEEDLE) ×2 IMPLANT
NEEDLE HYPO 25X1 1.5 SAFETY (NEEDLE) ×4 IMPLANT
NS IRRIG 1000ML POUR BTL (IV SOLUTION) ×4 IMPLANT
PACK BASIN DAY SURGERY FS (CUSTOM PROCEDURE TRAY) ×4 IMPLANT
PENCIL BUTTON HOLSTER BLD 10FT (ELECTRODE) ×4 IMPLANT
SHEET MEDIUM DRAPE 40X70 STRL (DRAPES) ×4 IMPLANT
SIZER BREAST 360CC (SIZER) ×4
SIZER BREAST INSPI REUSE 375CC (SIZER) ×4
SIZER BRST 360CC (SIZER) IMPLANT
SIZER BRST INSPI REUSE 375CC (SIZER) IMPLANT
SLEEVE SCD COMPRESS KNEE MED (MISCELLANEOUS) ×4 IMPLANT
SPONGE LAP 18X18 RF (DISPOSABLE) ×8 IMPLANT
STAPLER VISISTAT 35W (STAPLE) ×4 IMPLANT
SUT ETHILON 2 0 FS 18 (SUTURE) ×2 IMPLANT
SUT MNCRL AB 4-0 PS2 18 (SUTURE) ×6 IMPLANT
SUT PDS AB 2-0 CT2 27 (SUTURE) ×4 IMPLANT
SUT PROLENE 2 0 CT2 30 (SUTURE) IMPLANT
SUT VIC AB 3-0 SH 27 (SUTURE) ×8
SUT VIC AB 3-0 SH 27X BRD (SUTURE) ×2 IMPLANT
SUT VICRYL 4-0 PS2 18IN ABS (SUTURE) ×6 IMPLANT
SYR BULB IRRIGATION 50ML (SYRINGE) ×6 IMPLANT
SYR CONTROL 10ML LL (SYRINGE) ×4 IMPLANT
TOWEL OR 17X24 6PK STRL BLUE (TOWEL DISPOSABLE) ×8 IMPLANT
TUBE CONNECTING 20'X1/4 (TUBING) ×2
TUBE CONNECTING 20X1/4 (TUBING) ×4 IMPLANT
UNDERPAD 30X30 (UNDERPADS AND DIAPERS) ×8 IMPLANT
YANKAUER SUCT BULB TIP NO VENT (SUCTIONS) ×4 IMPLANT

## 2018-02-25 NOTE — Op Note (Signed)
Operative Note   DATE OF OPERATION: 5.21.19  LOCATION: [facility]-outpatient  SURGICAL DIVISION: Plastic Surgery  PREOPERATIVE DIAGNOSES:  1. Gender dysphoria 2. Right breast mass, history silicone injections  POSTOPERATIVE DIAGNOSES:  same  PROCEDURE:  1. Bilateral breast augmentation with silicone implants 2. Excision right breast mass  SURGEON: Glenna Fellows MD MBA  ASSISTANT: none  ANESTHESIA:  General.   EBL: 15 ml  COMPLICATIONS: None immediate.   INDICATIONS FOR PROCEDURE:  The patient, Renee Malone, is a 32 y.o. adult born on June 29, 1986, is here for breast augmentation. Patient has identified as female for several years and is compliant with hormone therapy. Patient has known right breast silicone granulomas and plan excision of largest palpable mass right breast.   FINDINGS: Right breast mass superior to NAC with free silicone within indurated cavity. Natrelle Smooth Round Moderate Profile 375 ml implants placed bilateral in subfascial position. REF SRM-375 RIGHT SN [id number] LEFT [id number]  DESCRIPTION OF PROCEDURE:  The patient's operative site was marked with the patient in the preoperative area. The patient was taken to the operating room. SCDs were placed and IV antibiotics were given. The patient's operative site was prepped and draped in a sterile fashion. A time out was performed and all information was confirmed to be correct. Incision designed at desired inframammary fold 8.5 cm from nipple. I began on left breast. Incision carried through superficial fascia to pectoralis surface. Pectoralis fascia elevated with overlying breast tissue. Dissection completed to anticipated dimensions implant. Interrupted 2-0 PDS suture placed from superficial fascia of caudal incision to chest wall to stabilize IMF. Sizer placed.  I then directed attention to right breast. Incision made at new IMF and carried through superficial fascia to pectoralis surface. Pectoralis  fascia elevated with overlying breast tissue. Dissection completed to anticipated dimensions implant. Incision made in fascia underlying palpable breast mass. Mass dissected free from surrounding breast tissue. Free silicone noted within cavity consistent with silicone granuloma. Interrupted 2-0 PDS suture placed from superficial fascia of caudal incision to chest wall to stabilize IMF. Sizer placed. Patient brought to upright sitting position and assessed for symmetry. Inspira Moderate Profile 375 implant selected for each side.  Patient returned to supine position and bilateral breast cavities irrigated with saline solution containing Ancef, gentamicin, and bacitracin. Hemostasis ensured. Exparel infiltrated throughout bilateral breasts. Left breast cavity irrigated with Betadine. Implant placed in cavity and orientation implant ensured. Closure completed with 3-0 vicryl in superficial fascia, 4-0 vicryl in dermis and running 4-0 monocryl subcuticular closure.   Over right breast, cavity irrigated with Betadine. Implant placed in cavity and orientation implant ensured. Closure completed in similar fashion. Dermabond applied followed by dry dressing and breast binder.  The patient was allowed to wake from anesthesia, extubated and taken to the recovery room in satisfactory condition.   SPECIMENS: right breast mass  DRAINS: none  Glenna Fellows, MD [facility] Plastic & Reconstructive Surgery [phone], pin [phone]

## 2018-02-25 NOTE — Discharge Instructions (Signed)

## 2018-02-25 NOTE — Anesthesia Procedure Notes (Signed)
Procedure Name: Intubation Date/Time: 02/25/2018 9:01 AM Performed by:  Desanctis, CRNA Pre-anesthesia Checklist: Patient identified, Emergency Drugs available, Suction available, Patient being monitored and Timeout performed Patient Re-evaluated:Patient Re-evaluated prior to induction Oxygen Delivery Method: Circle system utilized Preoxygenation: Pre-oxygenation with 100% oxygen Induction Type: IV induction Ventilation: Mask ventilation without difficulty Laryngoscope Size: Miller and 3 Grade View: Grade II Tube type: Oral Tube size: 8.0 mm Number of attempts: 1 Airway Equipment and Method: Stylet and Oral airway Placement Confirmation: ETT inserted through vocal cords under direct vision,  positive ETCO2 and breath sounds checked- equal and bilateral Secured at: 22 cm Tube secured with: Tape Dental Injury: Teeth and Oropharynx as per pre-operative assessment

## 2018-02-25 NOTE — Anesthesia Preprocedure Evaluation (Signed)
Anesthesia Evaluation  Patient identified by MRN, date of birth, ID band Patient awake    Reviewed: Allergy & Precautions, H&P , NPO status , Patient's Chart, lab work & pertinent test results, reviewed documented beta blocker date and time   Airway Mallampati: II  TM Distance: >3 FB Neck ROM: full    Dental no notable dental hx.    Pulmonary neg pulmonary ROS,    Pulmonary exam normal breath sounds clear to auscultation       Cardiovascular Exercise Tolerance: Good negative cardio ROS   Rhythm:regular Rate:Normal     Neuro/Psych PSYCHIATRIC DISORDERS Anxiety Depression negative neurological ROS     GI/Hepatic negative GI ROS, Neg liver ROS,   Endo/Other  negative endocrine ROS  Renal/GU negative Renal ROS  negative genitourinary   Musculoskeletal   Abdominal   Peds  Hematology  (+) HIV,   Anesthesia Other Findings   Reproductive/Obstetrics negative OB ROS                             Anesthesia Physical Anesthesia Plan  ASA: II  Anesthesia Plan: General   Post-op Pain Management:    Induction: Intravenous  PONV Risk Score and Plan: 2 and Ondansetron, Dexamethasone and Treatment may vary due to age or medical condition  Airway Management Planned: Oral ETT and LMA  Additional Equipment:   Intra-op Plan:   Post-operative Plan: Extubation in OR  Informed Consent: I have reviewed the patients History and Physical, chart, labs and discussed the procedure including the risks, benefits and alternatives for the proposed anesthesia with the patient or authorized representative who has indicated his/her understanding and acceptance.   Dental Advisory Given  Plan Discussed with: CRNA, Anesthesiologist and Surgeon  Anesthesia Plan Comments: ( )        Anesthesia Quick Evaluation

## 2018-02-25 NOTE — Interval H&P Note (Signed)
History and Physical Interval Note:  02/25/2018 8:24 AM  Renee Malone  has presented today for surgery, with the diagnosis of Gender dysphoria in adult  The various methods of treatment have been discussed with the patient and family. After consideration of risks, benefits and other options for treatment, the patient has consented to  Procedure(s): MAMMOPLASTY AUGMENTATION WITH SILICONE IMPLANTS (Bilateral) EXCISION RIGHT BREAST MASS (Right) as a surgical intervention .  The patient's history has been reviewed, patient examined, no change in status, stable for surgery.  I have reviewed the patient's chart and labs.  Questions were answered to the patient's satisfaction.    Discussed with patient in preoperative area plan subfascial placement implant.   Twanisha Foulk

## 2018-02-25 NOTE — Transfer of Care (Signed)
Immediate Anesthesia Transfer of Care Note  Patient: Renee Malone  Procedure(s) Performed: MAMMOPLASTY AUGMENTATION WITH SILICONE IMPLANTS (Bilateral Breast) EXCISION RIGHT BREAST MASS (Right Breast)  Patient Location: PACU  Anesthesia Type:General  Level of Consciousness: awake and patient cooperative  Airway & Oxygen Therapy: Patient Spontanous Breathing and Patient connected to face mask oxygen  Post-op Assessment: Report given to RN and Post -op Vital signs reviewed and stable  Post vital signs: Reviewed and stable  Last Vitals:  Vitals Value Taken Time  BP    Temp    Pulse 81 02/25/2018 10:53 AM  Resp 11 02/25/2018 10:53 AM  SpO2 100 % 02/25/2018 10:53 AM  Vitals shown include unvalidated device data.  Last Pain:  Vitals:   02/25/18 0817  TempSrc: Oral  PainSc: 0-No pain         Complications: No apparent anesthesia complications

## 2018-02-25 NOTE — Anesthesia Postprocedure Evaluation (Signed)
Anesthesia Post Note  Patient: Engineer, civil (consulting)  Procedure(s) Performed: MAMMOPLASTY AUGMENTATION WITH SILICONE IMPLANTS (Bilateral Breast) EXCISION RIGHT BREAST MASS (Right Breast)     Patient location during evaluation: PACU Anesthesia Type: General Level of consciousness: awake and alert Pain management: pain level controlled Vital Signs Assessment: post-procedure vital signs reviewed and stable Respiratory status: spontaneous breathing, nonlabored ventilation, respiratory function stable and patient connected to nasal cannula oxygen Cardiovascular status: blood pressure returned to baseline and stable Postop Assessment: no apparent nausea or vomiting Anesthetic complications: no    Last Vitals:  Vitals:   02/25/18 1130 02/25/18 1145  BP: (!) 140/97 138/86  Pulse: 80 78  Resp: 14 12  Temp:    SpO2: 100% 100%    Last Pain:  Vitals:   02/25/18 1200  TempSrc:   PainSc: 3                  Ryan P Ellender

## 2018-02-27 ENCOUNTER — Encounter (HOSPITAL_BASED_OUTPATIENT_CLINIC_OR_DEPARTMENT_OTHER): Payer: Self-pay | Admitting: Plastic Surgery

## 2018-02-28 ENCOUNTER — Other Ambulatory Visit: Payer: Self-pay | Admitting: Infectious Diseases

## 2018-02-28 DIAGNOSIS — B20 Human immunodeficiency virus [HIV] disease: Secondary | ICD-10-CM

## 2018-03-06 ENCOUNTER — Other Ambulatory Visit: Payer: Self-pay | Admitting: Infectious Diseases

## 2018-03-06 DIAGNOSIS — E349 Endocrine disorder, unspecified: Secondary | ICD-10-CM

## 2018-03-06 DIAGNOSIS — F640 Transsexualism: Principal | ICD-10-CM

## 2018-03-06 DIAGNOSIS — IMO0001 Reserved for inherently not codable concepts without codable children: Secondary | ICD-10-CM

## 2018-03-31 ENCOUNTER — Other Ambulatory Visit: Payer: Self-pay | Admitting: Infectious Diseases

## 2018-03-31 DIAGNOSIS — B20 Human immunodeficiency virus [HIV] disease: Secondary | ICD-10-CM

## 2018-04-21 ENCOUNTER — Ambulatory Visit: Payer: Self-pay | Admitting: Infectious Diseases

## 2018-04-21 ENCOUNTER — Ambulatory Visit: Payer: Self-pay

## 2018-04-30 ENCOUNTER — Ambulatory Visit: Payer: Self-pay | Admitting: Infectious Diseases

## 2018-05-02 ENCOUNTER — Other Ambulatory Visit: Payer: Self-pay | Admitting: Infectious Diseases

## 2018-05-02 DIAGNOSIS — IMO0001 Reserved for inherently not codable concepts without codable children: Secondary | ICD-10-CM

## 2018-05-02 DIAGNOSIS — E349 Endocrine disorder, unspecified: Secondary | ICD-10-CM

## 2018-05-02 DIAGNOSIS — F640 Transsexualism: Principal | ICD-10-CM

## 2018-05-08 ENCOUNTER — Other Ambulatory Visit (HOSPITAL_COMMUNITY)
Admission: RE | Admit: 2018-05-08 | Discharge: 2018-05-08 | Disposition: A | Discharge status: Home / Self Care | Payer: BLUE CROSS/BLUE SHIELD | Source: Ambulatory Visit | Attending: Infectious Diseases | Admitting: Infectious Diseases

## 2018-05-08 ENCOUNTER — Ambulatory Visit: Payer: BLUE CROSS/BLUE SHIELD

## 2018-05-08 ENCOUNTER — Encounter: Payer: Self-pay | Admitting: Infectious Diseases

## 2018-05-08 ENCOUNTER — Ambulatory Visit (INDEPENDENT_AMBULATORY_CARE_PROVIDER_SITE_OTHER): Payer: BLUE CROSS/BLUE SHIELD | Admitting: Infectious Diseases

## 2018-05-08 VITALS — Ht 75.0 in | Wt 197.0 lb

## 2018-05-08 DIAGNOSIS — B20 Human immunodeficiency virus [HIV] disease: Secondary | ICD-10-CM | POA: Diagnosis present

## 2018-05-08 DIAGNOSIS — Z113 Encounter for screening for infections with a predominantly sexual mode of transmission: Secondary | ICD-10-CM

## 2018-05-08 DIAGNOSIS — IMO0001 Reserved for inherently not codable concepts without codable children: Secondary | ICD-10-CM

## 2018-05-08 DIAGNOSIS — F640 Transsexualism: Secondary | ICD-10-CM

## 2018-05-08 DIAGNOSIS — E349 Endocrine disorder, unspecified: Secondary | ICD-10-CM | POA: Diagnosis not present

## 2018-05-08 DIAGNOSIS — Z23 Encounter for immunization: Secondary | ICD-10-CM | POA: Diagnosis not present

## 2018-05-08 NOTE — Addendum Note (Signed)
Addended by: Valarie ConesSTALEY, Gentri Guardado on: 05/08/2018 10:42 AM   Modules accepted: Orders

## 2018-05-08 NOTE — Progress Notes (Signed)
   Subjective:    Patient ID: Renee DeerChristopher Courts, adult    DOB: 11/19/1985, 32 y.o.   MRN: [id number]  HPI 32yo M -->F with Dx HIV+ 06-17-09. Has been on atripla -->odefsy (10-2015). Takes +/- with food.  Onmedroxy-progesteron, estrogen patch, spironolactone. Awaiting gender reassignment surgery, has not set date yet.Has had top done. Saw counselor. Seeing second counselor tomorrow.  Has been feeling well. [place].   HIV 1 RNA Quant (copies/mL)  Date Value  08/26/2017 <20 DETECTED (A)  11/14/2016 <20 DETECTED (A)  07/02/2016 <20   CD4 T Cell Abs (/uL)  Date Value  08/26/2017 1,220  11/14/2016 1,060  07/02/2016 930    Review of Systems  Constitutional: Negative for appetite change and unexpected weight change.  Gastrointestinal: Negative for constipation and diarrhea.  Genitourinary: Negative for difficulty urinating.  Psychiatric/Behavioral: Negative for dysphoric mood.  Please see HPI. All other systems reviewed and negative.      Objective:   Physical Exam  Constitutional: She appears well-developed and well-nourished.  HENT:  Mouth/Throat: No oropharyngeal exudate.  Eyes: Pupils are equal, round, and reactive to light. EOM are normal.  Neck: Normal range of motion. Neck supple.  Cardiovascular: Normal rate, regular rhythm and normal heart sounds.  Pulmonary/Chest: Effort normal and breath sounds normal.  Abdominal: Soft. Bowel sounds are normal. She exhibits no distension. There is no tenderness.  Musculoskeletal: Normal range of motion. She exhibits no edema.  Lymphadenopathy:    She has no cervical adenopathy.  Skin: Skin is warm and dry.  Psychiatric: She has a normal mood and affect.      Assessment & Plan:

## 2018-05-08 NOTE — Assessment & Plan Note (Signed)
Doing very well Will write letter in support of bottom surgery.

## 2018-05-08 NOTE — Progress Notes (Signed)
Prevar-13 Administered to L deltoid. Patient tolerated well.  S.Rashi Giuliani LPN

## 2018-05-08 NOTE — Assessment & Plan Note (Signed)
Doing very well M partner is negative, not on prep (does not want). Getting tested regularly.  Offered/refused condoms.  Will recheck labs today Needs PCV 13 rtc in 9 months.

## 2018-05-09 LAB — CBC
HCT: 45.7 % (ref 38.5–50.0)
Hemoglobin: 15.6 g/dL (ref 13.2–17.1)
MCH: 31.1 pg (ref 27.0–33.0)
MCHC: 34.1 g/dL (ref 32.0–36.0)
MCV: 91.2 fL (ref 80.0–100.0)
MPV: 10.8 fL (ref 7.5–12.5)
PLATELETS: 263 10*3/uL (ref 140–400)
RBC: 5.01 10*6/uL (ref 4.20–5.80)
RDW: 12.1 % (ref 11.0–15.0)
WBC: 5.8 10*3/uL (ref 3.8–10.8)

## 2018-05-09 LAB — COMPREHENSIVE METABOLIC PANEL
AG Ratio: 1.7 (calc) (ref 1.0–2.5)
ALBUMIN MSPROF: 4.9 g/dL (ref 3.6–5.1)
ALKALINE PHOSPHATASE (APISO): 44 U/L (ref 40–115)
ALT: 12 U/L (ref 9–46)
AST: 15 U/L (ref 10–40)
BUN: 11 mg/dL (ref 7–25)
CALCIUM: 10.2 mg/dL (ref 8.6–10.3)
CO2: 30 mmol/L (ref 20–32)
Chloride: 101 mmol/L (ref 98–110)
Creat: 1.24 mg/dL (ref 0.60–1.35)
Globulin: 2.9 g/dL (calc) (ref 1.9–3.7)
Glucose, Bld: 84 mg/dL (ref 65–99)
Potassium: 4 mmol/L (ref 3.5–5.3)
Sodium: 139 mmol/L (ref 135–146)
Total Bilirubin: 0.4 mg/dL (ref 0.2–1.2)
Total Protein: 7.8 g/dL (ref 6.1–8.1)

## 2018-05-09 LAB — T-HELPER CELL (CD4) - (RCID CLINIC ONLY)
CD4 % Helper T Cell: 45 % (ref 33–55)
CD4 T CELL ABS: 880 /uL (ref 400–2700)

## 2018-05-09 LAB — URINE CYTOLOGY ANCILLARY ONLY
Chlamydia: NEGATIVE
NEISSERIA GONORRHEA: NEGATIVE

## 2018-05-09 LAB — RPR: RPR: NONREACTIVE

## 2018-05-10 LAB — HIV-1 RNA QUANT-NO REFLEX-BLD
HIV 1 RNA Quant: <20 copies/mL — AB
HIV-1 RNA Quant, Log: <1.3 Log copies/mL — AB

## 2018-05-17 ENCOUNTER — Emergency Department (HOSPITAL_COMMUNITY): Payer: BLUE CROSS/BLUE SHIELD

## 2018-05-17 ENCOUNTER — Other Ambulatory Visit: Payer: Self-pay

## 2018-05-17 ENCOUNTER — Emergency Department (HOSPITAL_COMMUNITY)
Admission: EM | Admit: 2018-05-17 | Discharge: 2018-05-17 | Disposition: A | Discharge status: Home / Self Care | Payer: BLUE CROSS/BLUE SHIELD | Attending: Emergency Medicine | Admitting: Emergency Medicine

## 2018-05-17 ENCOUNTER — Encounter (HOSPITAL_COMMUNITY): Payer: Self-pay

## 2018-05-17 DIAGNOSIS — Y92410 Unspecified street and highway as the place of occurrence of the external cause: Secondary | ICD-10-CM | POA: Diagnosis not present

## 2018-05-17 DIAGNOSIS — Z21 Asymptomatic human immunodeficiency virus [HIV] infection status: Secondary | ICD-10-CM | POA: Diagnosis not present

## 2018-05-17 DIAGNOSIS — Z79899 Other long term (current) drug therapy: Secondary | ICD-10-CM | POA: Diagnosis not present

## 2018-05-17 DIAGNOSIS — S20212A Contusion of left front wall of thorax, initial encounter: Secondary | ICD-10-CM | POA: Insufficient documentation

## 2018-05-17 DIAGNOSIS — Y999 Unspecified external cause status: Secondary | ICD-10-CM | POA: Diagnosis not present

## 2018-05-17 DIAGNOSIS — Z9882 Breast implant status: Secondary | ICD-10-CM | POA: Insufficient documentation

## 2018-05-17 DIAGNOSIS — M25562 Pain in left knee: Secondary | ICD-10-CM | POA: Insufficient documentation

## 2018-05-17 DIAGNOSIS — M542 Cervicalgia: Secondary | ICD-10-CM | POA: Insufficient documentation

## 2018-05-17 DIAGNOSIS — Y939 Activity, unspecified: Secondary | ICD-10-CM | POA: Diagnosis not present

## 2018-05-17 MED ORDER — METHOCARBAMOL 500 MG PO TABS: 500.0000 mg | ORAL_TABLET | Freq: Two times a day (BID) | ORAL | 0 refills | Status: DC

## 2018-05-17 NOTE — ED Notes (Signed)
Patient verbalizes understanding of discharge instructions. Opportunity for questioning and answers were provided. Armband removed by staff, pt discharged from ED to home. Pt is ambulatory leaving the ED.

## 2018-05-17 NOTE — ED Notes (Signed)
Patient transported to X-ray 
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
## 2018-05-17 NOTE — ED Notes (Signed)
Pt able to walk from triage to exam room with steady gait and no assistance.
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
## 2018-05-28 ENCOUNTER — Other Ambulatory Visit: Payer: Self-pay | Admitting: Infectious Diseases

## 2018-05-28 DIAGNOSIS — B20 Human immunodeficiency virus [HIV] disease: Secondary | ICD-10-CM

## 2018-06-06 ENCOUNTER — Encounter: Payer: Self-pay | Admitting: Infectious Diseases

## 2018-06-10 ENCOUNTER — Ambulatory Visit: Payer: Self-pay | Admitting: Infectious Diseases

## 2018-07-05 ENCOUNTER — Other Ambulatory Visit: Payer: Self-pay

## 2018-07-05 ENCOUNTER — Encounter (HOSPITAL_COMMUNITY): Payer: Self-pay

## 2018-07-05 ENCOUNTER — Ambulatory Visit (HOSPITAL_COMMUNITY)
Admission: EM | Admit: 2018-07-05 | Discharge: 2018-07-05 | Disposition: A | Discharge status: Home / Self Care | Payer: BLUE CROSS/BLUE SHIELD | Attending: Internal Medicine | Admitting: Internal Medicine

## 2018-07-05 DIAGNOSIS — F419 Anxiety disorder, unspecified: Secondary | ICD-10-CM | POA: Insufficient documentation

## 2018-07-05 DIAGNOSIS — R011 Cardiac murmur, unspecified: Secondary | ICD-10-CM | POA: Diagnosis not present

## 2018-07-05 DIAGNOSIS — K6289 Other specified diseases of anus and rectum: Secondary | ICD-10-CM | POA: Diagnosis present

## 2018-07-05 DIAGNOSIS — B20 Human immunodeficiency virus [HIV] disease: Secondary | ICD-10-CM | POA: Diagnosis not present

## 2018-07-05 DIAGNOSIS — Z8249 Family history of ischemic heart disease and other diseases of the circulatory system: Secondary | ICD-10-CM | POA: Diagnosis not present

## 2018-07-05 DIAGNOSIS — Z113 Encounter for screening for infections with a predominantly sexual mode of transmission: Secondary | ICD-10-CM

## 2018-07-05 DIAGNOSIS — Z202 Contact with and (suspected) exposure to infections with a predominantly sexual mode of transmission: Secondary | ICD-10-CM | POA: Diagnosis not present

## 2018-07-05 DIAGNOSIS — R1030 Lower abdominal pain, unspecified: Secondary | ICD-10-CM | POA: Diagnosis not present

## 2018-07-05 DIAGNOSIS — Z708 Other sex counseling: Secondary | ICD-10-CM

## 2018-07-05 LAB — POCT URINALYSIS DIP (DEVICE)
Bilirubin Urine: NEGATIVE
GLUCOSE, UA: NEGATIVE mg/dL
Hgb urine dipstick: NEGATIVE
Ketones, ur: NEGATIVE mg/dL
LEUKOCYTES UA: NEGATIVE
Nitrite: NEGATIVE
Protein, ur: 30 mg/dL — AB
SPECIFIC GRAVITY, URINE: 1.025 (ref 1.005–1.030)
UROBILINOGEN UA: 0.2 mg/dL (ref 0.0–1.0)
pH: 6 (ref 5.0–8.0)

## 2018-07-05 MED ORDER — CEFTRIAXONE SODIUM 250 MG IJ SOLR
INTRAMUSCULAR | Status: AC
  Filled 2018-07-05: qty 250

## 2018-07-05 MED ORDER — AZITHROMYCIN 250 MG PO TABS
1000.0000 mg | ORAL_TABLET | Freq: Once | ORAL | Status: AC
  Administered 2018-07-05: 1000 mg via ORAL

## 2018-07-05 MED ORDER — LIDOCAINE HCL (PF) 1 % IJ SOLN
INTRAMUSCULAR | Status: AC
  Filled 2018-07-05: qty 2

## 2018-07-05 MED ORDER — AZITHROMYCIN 250 MG PO TABS
ORAL_TABLET | ORAL | Status: AC
  Filled 2018-07-05: qty 4

## 2018-07-05 MED ORDER — CEFTRIAXONE SODIUM 250 MG IJ SOLR
250.0000 mg | Freq: Once | INTRAMUSCULAR | Status: AC
Start: 2018-07-05 — End: 2018-07-05
  Administered 2018-07-05: 250 mg via INTRAMUSCULAR

## 2018-07-05 NOTE — Discharge Instructions (Signed)
We will contact you with your results as they come in.

## 2018-07-05 NOTE — ED Provider Notes (Signed)
07/05/2018 11:13 AM   DOB: 17-May-1986 / MRN: [id number]  SUBJECTIVE:  Renee Malone is a 32 y.o. adult presenting for lower abdominal pain along with rectal irritation that started a few days ago.  Assoicates nothing.  Denies any groin pain and rash.  Has tried nothing. Is very concerned for STI and would like to be treated and tested for gonorrhea, chlamydia, and trichomonas.    She is allergic to broccoli [brassica oleracea italica].   She  has a past medical history of Anxiety, Heart murmur, and HIV infection (HCC).    She  reports that she has never smoked. She has never used smokeless tobacco. She reports that she drinks about 1.0 standard drinks of alcohol per week. She reports that she does not use drugs. She  reports that she does not currently engage in sexual activity but has had partner(s) who are Female. The patient  has a past surgical history that includes Skin graft; Breast enhancement surgery (Bilateral, 02/25/2018); and Breast lumpectomy (Right, 02/25/2018).  Her family history includes Hypertension in her maternal grandmother.  ROS Per HPI.   OBJECTIVE:  BP 108/72 (BP Location: Left Arm)   Pulse 73   Temp 99.1 F (37.3 C) (Oral)   Resp 18   Wt 196 lb (88.9 kg)   SpO2 100%   BMI 24.50 kg/m   Wt Readings from Last 3 Encounters:  07/05/18 196 lb (88.9 kg)  05/08/18 197 lb (89.4 kg)  02/25/18 185 lb 9.6 oz (84.2 kg)   Temp Readings from Last 3 Encounters:  07/05/18 99.1 F (37.3 C) (Oral)  05/17/18 98.8 F (37.1 C) (Oral)  02/25/18 98.1 F (36.7 C)   BP Readings from Last 3 Encounters:  07/05/18 108/72  05/17/18 120/88  02/25/18 132/84   Pulse Readings from Last 3 Encounters:  07/05/18 73  05/17/18 73  02/25/18 74    Physical Exam  Constitutional: She is oriented to person, place, and time. She appears well-developed. She does not appear ill.  Eyes: Pupils are equal, round, and reactive to light. Conjunctivae and EOM are normal.  Cardiovascular:  Normal rate and regular rhythm.  Pulmonary/Chest: Effort normal and breath sounds normal.  Abdominal: Soft. She exhibits no distension and no mass. There is no tenderness. There is no rebound and no guarding. No hernia.  Musculoskeletal: Normal range of motion.  Neurological: She is alert and oriented to person, place, and time. No cranial nerve deficit. Coordination normal.  Skin: Skin is warm and dry. She is not diaphoretic.  Psychiatric: She has a normal mood and affect.  Nursing note and vitals reviewed.   Results for orders placed or performed during the hospital encounter of 07/05/18 (from the past 72 hour(s))  POCT urinalysis dip (device)     Status: Abnormal   Collection Time: 07/05/18 10:59 AM  Result Value Ref Range   Glucose, UA NEGATIVE NEGATIVE mg/dL   Bilirubin Urine NEGATIVE NEGATIVE   Ketones, ur NEGATIVE NEGATIVE mg/dL   Specific Gravity, Urine 1.025 1.005 - 1.030   Hgb urine dipstick NEGATIVE NEGATIVE   pH 6.0 5.0 - 8.0   Protein, ur 30 (A) NEGATIVE mg/dL   Urobilinogen, UA 0.2 0.0 - 1.0 mg/dL   Nitrite NEGATIVE NEGATIVE   Leukocytes, UA NEGATIVE NEGATIVE    Comment: Biochemical Testing Only. Please order routine urinalysis from main lab if confirmatory testing is needed.   Lab Results  Component Value Date   CREATININE 1.24 05/08/2018     No results found.  ASSESSMENT AND PLAN:   Anal irritation  Sexually transmitted disease counseling    Discharge Instructions     We will contact you with your results as they come in.         The patient is advised to call or return to clinic if she does not see an improvement in symptoms, or to seek the care of the closest emergency department if she worsens with the above plan.   Deliah Boston, MHS, PA-C 07/05/2018 11:13 AM   Ofilia Neas, PA-C 07/05/18 1113

## 2018-07-05 NOTE — ED Triage Notes (Signed)
Pt states that he would like to get std treatment. Pt states he has had abdominal pain x 1 week.

## 2018-07-07 LAB — URINE CYTOLOGY ANCILLARY ONLY
Chlamydia: NEGATIVE
NEISSERIA GONORRHEA: NEGATIVE
TRICH (WINDOWPATH): NEGATIVE

## 2018-09-01 ENCOUNTER — Other Ambulatory Visit: Payer: Self-pay | Admitting: Infectious Diseases

## 2018-09-01 DIAGNOSIS — F640 Transsexualism: Principal | ICD-10-CM

## 2018-09-01 DIAGNOSIS — E349 Endocrine disorder, unspecified: Secondary | ICD-10-CM

## 2018-09-01 DIAGNOSIS — IMO0001 Reserved for inherently not codable concepts without codable children: Secondary | ICD-10-CM

## 2018-10-05 ENCOUNTER — Other Ambulatory Visit: Payer: Self-pay | Admitting: Infectious Diseases

## 2018-10-05 DIAGNOSIS — E349 Endocrine disorder, unspecified: Secondary | ICD-10-CM

## 2018-10-05 DIAGNOSIS — F640 Transsexualism: Principal | ICD-10-CM

## 2018-10-05 DIAGNOSIS — IMO0001 Reserved for inherently not codable concepts without codable children: Secondary | ICD-10-CM

## 2018-11-05 ENCOUNTER — Other Ambulatory Visit: Payer: Self-pay | Admitting: Infectious Diseases

## 2018-11-05 DIAGNOSIS — E349 Endocrine disorder, unspecified: Secondary | ICD-10-CM

## 2018-11-05 DIAGNOSIS — IMO0001 Reserved for inherently not codable concepts without codable children: Secondary | ICD-10-CM

## 2018-11-05 DIAGNOSIS — F640 Transsexualism: Principal | ICD-10-CM

## 2018-11-10 ENCOUNTER — Ambulatory Visit: Payer: Self-pay | Admitting: Infectious Diseases

## 2018-11-26 ENCOUNTER — Other Ambulatory Visit: Payer: Self-pay | Admitting: Infectious Diseases

## 2018-11-26 DIAGNOSIS — B20 Human immunodeficiency virus [HIV] disease: Secondary | ICD-10-CM

## 2018-12-01 ENCOUNTER — Other Ambulatory Visit: Payer: Self-pay

## 2018-12-01 ENCOUNTER — Other Ambulatory Visit: Payer: BLUE CROSS/BLUE SHIELD

## 2018-12-01 ENCOUNTER — Encounter: Payer: Self-pay | Admitting: Infectious Disease

## 2018-12-01 DIAGNOSIS — Z79899 Other long term (current) drug therapy: Secondary | ICD-10-CM

## 2018-12-01 DIAGNOSIS — B20 Human immunodeficiency virus [HIV] disease: Secondary | ICD-10-CM

## 2018-12-01 DIAGNOSIS — Z113 Encounter for screening for infections with a predominantly sexual mode of transmission: Secondary | ICD-10-CM

## 2018-12-02 LAB — T-HELPER CELL (CD4) - (RCID CLINIC ONLY)
CD4 % Helper T Cell: 37 % (ref 33–55)
CD4 T Cell Abs: 680 /uL (ref 400–2700)

## 2018-12-03 LAB — CBC WITH DIFFERENTIAL/PLATELET
ABSOLUTE MONOCYTES: 536 {cells}/uL (ref 200–950)
BASOS PCT: 1 %
Basophils Absolute: 57 cells/uL (ref 0–200)
EOS PCT: 1.7 %
Eosinophils Absolute: 97 cells/uL (ref 15–500)
HCT: 43.3 % (ref 38.5–50.0)
Hemoglobin: 14.8 g/dL (ref 13.2–17.1)
LYMPHS ABS: 1858 {cells}/uL (ref 850–3900)
MCH: 31.2 pg (ref 27.0–33.0)
MCHC: 34.2 g/dL (ref 32.0–36.0)
MCV: 91.2 fL (ref 80.0–100.0)
MPV: 10.7 fL (ref 7.5–12.5)
Monocytes Relative: 9.4 %
Neutro Abs: 3152 cells/uL (ref 1500–7800)
Neutrophils Relative %: 55.3 %
PLATELETS: 254 10*3/uL (ref 140–400)
RBC: 4.75 10*6/uL (ref 4.20–5.80)
RDW: 12.6 % (ref 11.0–15.0)
TOTAL LYMPHOCYTE: 32.6 %
WBC: 5.7 10*3/uL (ref 3.8–10.8)

## 2018-12-03 LAB — LIPID PANEL
Cholesterol: 183 mg/dL (ref <200)
HDL: 55 mg/dL (ref >=40)
LDL CHOLESTEROL (CALC): 113 mg/dL — AB
NON-HDL CHOLESTEROL (CALC): 128 mg/dL (ref <130)
Total CHOL/HDL Ratio: 3.3 (calc) (ref <5.0)
Triglycerides: 68 mg/dL (ref <150)

## 2018-12-03 LAB — HIV-1 RNA QUANT-NO REFLEX-BLD
HIV 1 RNA Quant: <20 copies/mL — AB
HIV-1 RNA Quant, Log: <1.3 Log copies/mL — AB

## 2018-12-03 LAB — COMPLETE METABOLIC PANEL WITH GFR
AG Ratio: 1.7 (calc) (ref 1.0–2.5)
ALT: 19 U/L (ref 9–46)
AST: 18 U/L (ref 10–40)
Albumin: 4.5 g/dL (ref 3.6–5.1)
Alkaline phosphatase (APISO): 38 U/L (ref 36–130)
BUN: 13 mg/dL (ref 7–25)
CALCIUM: 9.9 mg/dL (ref 8.6–10.3)
CO2: 31 mmol/L (ref 20–32)
CREATININE: 1.17 mg/dL (ref 0.60–1.35)
Chloride: 103 mmol/L (ref 98–110)
GFR, EST AFRICAN AMERICAN: 94 mL/min/{1.73_m2} (ref >=60)
GFR, EST NON AFRICAN AMERICAN: 81 mL/min/{1.73_m2} (ref >=60)
Globulin: 2.6 g/dL (calc) (ref 1.9–3.7)
Glucose, Bld: 70 mg/dL (ref 65–99)
Potassium: 4.1 mmol/L (ref 3.5–5.3)
Sodium: 141 mmol/L (ref 135–146)
TOTAL PROTEIN: 7.1 g/dL (ref 6.1–8.1)
Total Bilirubin: 0.6 mg/dL (ref 0.2–1.2)

## 2018-12-08 ENCOUNTER — Ambulatory Visit (INDEPENDENT_AMBULATORY_CARE_PROVIDER_SITE_OTHER): Payer: BLUE CROSS/BLUE SHIELD | Admitting: Infectious Disease

## 2018-12-08 ENCOUNTER — Encounter: Payer: Self-pay | Admitting: *Deleted

## 2018-12-08 ENCOUNTER — Encounter: Payer: Self-pay | Admitting: Infectious Disease

## 2018-12-08 VITALS — BP 130/90 | HR 80 | Temp 98.5°F | Wt 206.8 lb

## 2018-12-08 DIAGNOSIS — Z23 Encounter for immunization: Secondary | ICD-10-CM | POA: Diagnosis not present

## 2018-12-08 DIAGNOSIS — B20 Human immunodeficiency virus [HIV] disease: Secondary | ICD-10-CM | POA: Diagnosis not present

## 2018-12-08 DIAGNOSIS — IMO0001 Reserved for inherently not codable concepts without codable children: Secondary | ICD-10-CM

## 2018-12-08 DIAGNOSIS — E349 Endocrine disorder, unspecified: Secondary | ICD-10-CM | POA: Diagnosis not present

## 2018-12-08 DIAGNOSIS — F640 Transsexualism: Secondary | ICD-10-CM

## 2018-12-08 NOTE — Progress Notes (Signed)
Subjective:  Chief complaint: Needs new letter as changing to a local plastic surgeon  Patient ID: Renee Malone, adult    DOB: 1986-01-26, 33 y.o.   MRN: [id number]  HPI  Renee Malone is a 33 year old African-American transgender female who is living with HIV with perfect virological suppression with a viral load less than 20 consistently every time she has been checked.  She takes [facility] religiously.  She is on spironolactone as well as estradiol and Provera and medroxyprogesterone.  She has had plastic surgery and is in need of "bottom plastic surgery.  She has been to go to [place] [facility] [place] but now would like to be connected to a Therapist, nutritional at [facility].  They have therefore wrote her a new letter and signed it and so that she could give this to her insurance company and to her Engineer, petroleum at [facility].  Past Medical History:  Diagnosis Date  . Anxiety    panic attacks  . Heart murmur   . HIV infection [facility])     Past Surgical History:  Procedure Laterality Date  . BREAST ENHANCEMENT SURGERY Bilateral 02/25/2018   Procedure: MAMMOPLASTY AUGMENTATION WITH SILICONE IMPLANTS;  Surgeon: Glenna Fellows, MD;  Location: [facility];  Service: Plastics;  Laterality: Bilateral;  . BREAST LUMPECTOMY Right 02/25/2018   Procedure: EXCISION RIGHT BREAST MASS;  Surgeon: Glenna Fellows, MD;  Location: [facility];  Service: Plastics;  Laterality: Right;  . SKIN GRAFT      Family History  Problem Relation Age of Onset  . Hypertension Maternal Grandmother       Social History   Socioeconomic History  . Marital status: Married    Spouse name: Not on file  . Number of children: Not on file  . Years of education: Not on file  . Highest education level: Not on file  Occupational History  . Not on file  Social Needs  . Financial resource strain: Not on file  . Food insecurity:    Worry: Not on file    Inability: Not on file  .  Transportation needs:    Medical: Not on file    Non-medical: Not on file  Tobacco Use  . Smoking status: Never Smoker  . Smokeless tobacco: Never Used  Substance and Sexual Activity  . Alcohol use: Yes    Alcohol/week: 1.0 standard drinks    Types: 1 Standard drinks or equivalent per week    Comment: rarely  . Drug use: No  . Sexual activity: Not Currently    Partners: Male  Lifestyle  . Physical activity:    Days per week: Not on file    Minutes per session: Not on file  . Stress: Not on file  Relationships  . Social connections:    Talks on phone: Not on file    Gets together: Not on file    Attends religious service: Not on file    Active member of club or organization: Not on file    Attends meetings of clubs or organizations: Not on file    Relationship status: Not on file  Other Topics Concern  . Not on file  Social History Narrative   Lives alone.     Allergies  Allergen Reactions  . Broccoli [Brassica Oleracea Italica] Anaphylaxis     Current Outpatient Medications:  .  estradiol (CLIMARA - DOSED IN MG/24 HR) 0.025 mg/24hr patch, PLACE 1 PATCH ONTO THE SKIN ONCE A WEEK, Disp: 12 patch,  Rfl: 0 .  medroxyPROGESTERone (PROVERA) 5 MG tablet, TAKE AS DIRECTED, Disp: 30 tablet, Rfl: 0 .  ODEFSEY 200-25-25 MG TABS tablet, TAKE 1 TABLET BY MOUTH ONCE DAILY, Disp: 30 tablet, Rfl: 0 .  spironolactone (ALDACTONE) 25 MG tablet, TAKE 1 TABLET BY MOUTH TWICE DAILY, Disp: 90 tablet, Rfl: 0 .  spironolactone (ALDACTONE) 50 MG tablet, Take 0.5 tablets (25 mg total) 2 (two) times daily by mouth., Disp: 120 tablet, Rfl: 3 .  hydrocortisone 1 % ointment, Apply 1 application topically 2 (two) times daily. (Patient taking differently: Apply 1 application topically 2 (two) times daily as needed for itching. ), Disp: 30 g, Rfl: 0   Review of Systems  Constitutional: Negative for activity change, appetite change, chills, diaphoresis, fatigue, fever and unexpected weight change.    HENT: Negative for congestion, rhinorrhea, sinus pressure, sneezing, sore throat and trouble swallowing.   Eyes: Negative for photophobia and visual disturbance.  Respiratory: Negative for cough, chest tightness, shortness of breath, wheezing and stridor.   Cardiovascular: Negative for chest pain, palpitations and leg swelling.  Gastrointestinal: Negative for abdominal distention, abdominal pain, anal bleeding, blood in stool, constipation, diarrhea, nausea and vomiting.  Genitourinary: Negative for difficulty urinating, dysuria, flank pain and hematuria.  Musculoskeletal: Negative for arthralgias, back pain, gait problem, joint swelling and myalgias.  Skin: Negative for color change, pallor, rash and wound.  Neurological: Negative for dizziness, tremors, weakness and light-headedness.  Hematological: Negative for adenopathy. Does not bruise/bleed easily.  Psychiatric/Behavioral: Negative for agitation, behavioral problems, confusion, decreased concentration, dysphoric mood and sleep disturbance.       Objective:   Physical Exam Constitutional:      General: She is not in acute distress.    Appearance: Normal appearance. She is not diaphoretic.  HENT:     Head: Normocephalic and atraumatic.     Right Ear: External ear normal.     Left Ear: External ear normal.     Nose: Nose normal.     Mouth/Throat:     Pharynx: No oropharyngeal exudate.  Eyes:     General: No scleral icterus.    Conjunctiva/sclera: Conjunctivae normal.  Neck:     Musculoskeletal: Normal range of motion and neck supple.  Cardiovascular:     Rate and Rhythm: Normal rate and regular rhythm.     Heart sounds: Normal heart sounds. No murmur. No friction rub. No gallop.   Pulmonary:     Effort: Pulmonary effort is normal. No respiratory distress.     Breath sounds: No wheezing.  Abdominal:     General: Bowel sounds are normal. There is no distension.     Palpations: Abdomen is soft.  Musculoskeletal: Normal  range of motion.        General: No tenderness.  Lymphadenopathy:     Cervical: No cervical adenopathy.  Skin:    General: Skin is warm and dry.     Coloration: Skin is not pale.     Findings: No erythema or rash.  Neurological:     General: No focal deficit present.     Mental Status: She is alert and oriented to person, place, and time.     Coordination: Coordination normal.  Psychiatric:        Mood and Affect: Mood normal.        Behavior: Behavior normal.        Thought Content: Thought content normal.        Judgment: Judgment normal.  Assessment & Plan:   HIV: continue Odefsey, check labs in 6 months  Gender dysphoria: I have written letter in support of her undergoing plastic surgery at [facility].  She should in the interim continue on her Spironolactone her estradiol and her Depo-Provera  I spent greater than 25 minutes with the patient including greater than 50% of time in face to face counsel of the patient and her labs recommending need for tetanus and flu shot reviewing what she needed in terms of letters to support her surgery and in coordination of her care.

## 2018-12-12 NOTE — Addendum Note (Signed)
Addended by: Alesia Morin F on: 12/12/2018 12:56 PM   Modules accepted: Orders

## 2018-12-27 ENCOUNTER — Other Ambulatory Visit: Payer: Self-pay | Admitting: Infectious Diseases

## 2018-12-27 DIAGNOSIS — B20 Human immunodeficiency virus [HIV] disease: Secondary | ICD-10-CM

## 2018-12-30 ENCOUNTER — Encounter: Payer: Self-pay | Admitting: Infectious Disease

## 2019-02-13 ENCOUNTER — Other Ambulatory Visit: Payer: Self-pay | Admitting: Infectious Diseases

## 2019-02-13 DIAGNOSIS — IMO0001 Reserved for inherently not codable concepts without codable children: Secondary | ICD-10-CM

## 2019-02-13 DIAGNOSIS — E349 Endocrine disorder, unspecified: Secondary | ICD-10-CM

## 2019-03-05 ENCOUNTER — Other Ambulatory Visit: Payer: Self-pay | Admitting: Infectious Diseases

## 2019-03-05 DIAGNOSIS — IMO0001 Reserved for inherently not codable concepts without codable children: Secondary | ICD-10-CM

## 2019-03-05 DIAGNOSIS — E349 Endocrine disorder, unspecified: Secondary | ICD-10-CM

## 2019-05-27 ENCOUNTER — Other Ambulatory Visit: Payer: Self-pay | Admitting: Infectious Diseases

## 2019-05-27 ENCOUNTER — Other Ambulatory Visit: Payer: Self-pay

## 2019-05-27 ENCOUNTER — Telehealth: Payer: Self-pay | Admitting: *Deleted

## 2019-05-27 ENCOUNTER — Other Ambulatory Visit: Payer: BLUE CROSS/BLUE SHIELD

## 2019-05-27 DIAGNOSIS — B20 Human immunodeficiency virus [HIV] disease: Secondary | ICD-10-CM

## 2019-05-27 DIAGNOSIS — E349 Endocrine disorder, unspecified: Secondary | ICD-10-CM

## 2019-05-27 DIAGNOSIS — IMO0001 Reserved for inherently not codable concepts without codable children: Secondary | ICD-10-CM

## 2019-05-27 DIAGNOSIS — R21 Rash and other nonspecific skin eruption: Secondary | ICD-10-CM

## 2019-05-27 MED ORDER — HYDROCORTISONE 1 % EX OINT: 1.0000 "application " | TOPICAL_OINTMENT | Freq: Two times a day (BID) | CUTANEOUS | 2 refills | Status: AC

## 2019-05-27 NOTE — Telephone Encounter (Signed)
Patient in today for labs and wanted to talk about bug bites on her legs and lower body, worse at the ankle. She wanted to know what she should do about the bites as they are super itchy. After speaking with provider advised her to take benadryl and he sent in a Rx for hydrocortisone cream to her pharmacy.

## 2019-05-28 LAB — T-HELPER CELL (CD4) - (RCID CLINIC ONLY)
CD4 % Helper T Cell: 47 % (ref 33–65)
CD4 T Cell Abs: 939 /uL (ref 400–1790)

## 2019-05-30 LAB — CBC WITH DIFFERENTIAL/PLATELET
Absolute Monocytes: 545 cells/uL (ref 200–950)
Basophils Absolute: 41 cells/uL (ref 0–200)
Basophils Relative: 0.6 %
Eosinophils Absolute: 186 cells/uL (ref 15–500)
Eosinophils Relative: 2.7 %
HCT: 44.2 % (ref 38.5–50.0)
Hemoglobin: 15 g/dL (ref 13.2–17.1)
Lymphs Abs: 2132 cells/uL (ref 850–3900)
MCH: 30.6 pg (ref 27.0–33.0)
MCHC: 33.9 g/dL (ref 32.0–36.0)
MCV: 90.2 fL (ref 80.0–100.0)
MPV: 11.2 fL (ref 7.5–12.5)
Monocytes Relative: 7.9 %
Neutro Abs: 3995 cells/uL (ref 1500–7800)
Neutrophils Relative %: 57.9 %
Platelets: 244 10*3/uL (ref 140–400)
RBC: 4.9 10*6/uL (ref 4.20–5.80)
RDW: 13.1 % (ref 11.0–15.0)
Total Lymphocyte: 30.9 %
WBC: 6.9 10*3/uL (ref 3.8–10.8)

## 2019-05-30 LAB — COMPLETE METABOLIC PANEL WITH GFR
AG Ratio: 2.1 (calc) (ref 1.0–2.5)
ALT: 16 U/L (ref 9–46)
AST: 17 U/L (ref 10–40)
Albumin: 4.8 g/dL (ref 3.6–5.1)
Alkaline phosphatase (APISO): 40 U/L (ref 36–130)
BUN/Creatinine Ratio: 10 (calc) (ref 6–22)
BUN: 14 mg/dL (ref 7–25)
CO2: 28 mmol/L (ref 20–32)
Calcium: 9.9 mg/dL (ref 8.6–10.3)
Chloride: 102 mmol/L (ref 98–110)
Creat: 1.42 mg/dL — ABNORMAL HIGH (ref 0.60–1.35)
GFR, Est African American: 74 mL/min/{1.73_m2} (ref >=60)
GFR, Est Non African American: 64 mL/min/{1.73_m2} (ref >=60)
Globulin: 2.3 g/dL (calc) (ref 1.9–3.7)
Glucose, Bld: 86 mg/dL (ref 65–99)
Potassium: 4 mmol/L (ref 3.5–5.3)
Sodium: 141 mmol/L (ref 135–146)
Total Bilirubin: 0.4 mg/dL (ref 0.2–1.2)
Total Protein: 7.1 g/dL (ref 6.1–8.1)

## 2019-05-30 LAB — HIV-1 RNA QUANT-NO REFLEX-BLD
HIV 1 RNA Quant: <20 copies/mL — AB
HIV-1 RNA Quant, Log: <1.3 Log copies/mL — AB

## 2019-05-30 LAB — RPR: RPR Ser Ql: NONREACTIVE

## 2019-06-04 ENCOUNTER — Encounter: Payer: Self-pay | Admitting: Infectious Disease

## 2019-06-10 ENCOUNTER — Ambulatory Visit (INDEPENDENT_AMBULATORY_CARE_PROVIDER_SITE_OTHER): Payer: Self-pay | Admitting: Infectious Disease

## 2019-06-10 ENCOUNTER — Other Ambulatory Visit: Payer: Self-pay

## 2019-06-10 ENCOUNTER — Encounter: Payer: Self-pay | Admitting: Infectious Disease

## 2019-06-10 DIAGNOSIS — F640 Transsexualism: Secondary | ICD-10-CM

## 2019-06-10 DIAGNOSIS — E349 Endocrine disorder, unspecified: Secondary | ICD-10-CM

## 2019-06-10 DIAGNOSIS — IMO0001 Reserved for inherently not codable concepts without codable children: Secondary | ICD-10-CM

## 2019-06-10 DIAGNOSIS — R7989 Other specified abnormal findings of blood chemistry: Secondary | ICD-10-CM | POA: Insufficient documentation

## 2019-06-10 DIAGNOSIS — B20 Human immunodeficiency virus [HIV] disease: Secondary | ICD-10-CM

## 2019-06-10 HISTORY — DX: Other specified abnormal findings of blood chemistry: R79.89

## 2019-06-10 MED ORDER — ODEFSEY 200-25-25 MG PO TABS: 1.0000 | ORAL_TABLET | Freq: Every day | ORAL | 11 refills | Status: DC

## 2019-06-10 NOTE — Progress Notes (Signed)
Virtual Visit via Telephone Note  I connected with Renee Malone on 06/10/19 at  2:30 PM EDT by telephone and verified that I am speaking with the correct person using two identifiers.  Location: Patient: Home Provider: Home   I discussed the limitations, risks, security and privacy concerns of performing an evaluation and management service by telephone and the availability of in person appointments. I also discussed with the patient that there may be a patient responsible charge related to this service. The patient expressed understanding and agreed to proceed.  Chief complaint: Concern of her creatinine being elevated  History of Present Illness:   Renee Malone is a 33 year old African-American transgender woman who is living with HIV that is been well controlled on Odefsey.  She is on spironolactone that is increased to 50 mg as well as estradiol.  On review of her recent labs she noticed that her serum creatinine had crept up to 1 4 range.  This is happened before I wonder if it is potentially due to the elevation spironolactone dose.  He was concerned by this and I have arranged for her to have repeat labs including a metabolic panel urine creatinine and sodium and also check testosterone level.  Repeat creatinine remains elevated we may have to back off on the spironolactone dose.  He was going to go to [facility] for gender affirming surgery but she lost her insurance and is now back on HIV medication assistance program.  He is otherwise doing well and has no complaints on 12 point review of systems   Observations/Objective:  Renee Malone is doing quite well with no complaints today she is highly adherent to her medications and engaged in her care  Assessment and Plan:  HIV disease continue [place] with meals and avoiding PPIs and H2 blockers and antacids  Transgender health: We may need to down titrate her spironolactone continue estradiol.  We will check serum testosterone in the  morning when she comes in for labs  Creatinine elevation: Could be due to spironolactone, dehydration we will check labs including urine electrolytes  Need for vaccination she will get flu shot   Follow Up Instructions:    I discussed the assessment and treatment plan with the patient. The patient was provided an opportunity to ask questions and all were answered. The patient agreed with the plan and demonstrated an understanding of the instructions.   The patient was advised to call back or seek an in-person evaluation if the symptoms worsen or if the condition fails to improve as anticipated.  I provided 21 minutes of non-face-to-face time during this encounter.   Alcide Evener, MD

## 2019-06-24 ENCOUNTER — Other Ambulatory Visit: Payer: Self-pay

## 2019-06-24 ENCOUNTER — Other Ambulatory Visit: Payer: Self-pay | Admitting: *Deleted

## 2019-06-24 DIAGNOSIS — IMO0001 Reserved for inherently not codable concepts without codable children: Secondary | ICD-10-CM

## 2019-06-24 DIAGNOSIS — R7989 Other specified abnormal findings of blood chemistry: Secondary | ICD-10-CM

## 2019-06-24 DIAGNOSIS — E349 Endocrine disorder, unspecified: Secondary | ICD-10-CM

## 2019-06-24 DIAGNOSIS — Z23 Encounter for immunization: Secondary | ICD-10-CM

## 2019-06-24 LAB — BASIC METABOLIC PANEL
BUN: 12 mg/dL (ref 7–25)
CO2: 30 mmol/L (ref 20–32)
Calcium: 10.2 mg/dL (ref 8.6–10.3)
Chloride: 103 mmol/L (ref 98–110)
Creat: 1.24 mg/dL (ref 0.60–1.35)
Glucose, Bld: 94 mg/dL (ref 65–99)
Potassium: 4.5 mmol/L (ref 3.5–5.3)
Sodium: 138 mmol/L (ref 135–146)

## 2019-06-24 LAB — TESTOSTERONE: Testosterone: 531 ng/dL (ref 250–827)

## 2019-06-25 LAB — URINALYSIS, ROUTINE W REFLEX MICROSCOPIC
Bilirubin Urine: NEGATIVE
Glucose, UA: NEGATIVE
Hgb urine dipstick: NEGATIVE
Ketones, ur: NEGATIVE
Leukocytes,Ua: NEGATIVE
Nitrite: NEGATIVE
Protein, ur: NEGATIVE
Specific Gravity, Urine: 1.017 (ref 1.001–1.03)
pH: 7 (ref 5.0–8.0)

## 2019-06-25 LAB — CREATININE, URINE, RANDOM: Creatinine, Urine: 136 mg/dL (ref 20–320)

## 2019-07-21 ENCOUNTER — Other Ambulatory Visit: Payer: Self-pay | Admitting: Infectious Diseases

## 2019-07-21 DIAGNOSIS — E349 Endocrine disorder, unspecified: Secondary | ICD-10-CM

## 2019-07-21 DIAGNOSIS — IMO0001 Reserved for inherently not codable concepts without codable children: Secondary | ICD-10-CM

## 2019-07-29 ENCOUNTER — Other Ambulatory Visit: Payer: Self-pay

## 2019-07-29 DIAGNOSIS — Z20822 Contact with and (suspected) exposure to covid-19: Secondary | ICD-10-CM

## 2019-07-30 LAB — NOVEL CORONAVIRUS, NAA: SARS-CoV-2, NAA: DETECTED — AB

## 2019-08-05 ENCOUNTER — Other Ambulatory Visit: Payer: Self-pay

## 2019-08-05 DIAGNOSIS — Z20822 Contact with and (suspected) exposure to covid-19: Secondary | ICD-10-CM

## 2019-08-06 LAB — NOVEL CORONAVIRUS, NAA: SARS-CoV-2, NAA: DETECTED — AB

## 2019-10-07 NOTE — Addendum Note (Signed)
Addended by: Dolan Amen D on: 10/07/2019 03:58 PM   Modules accepted: Orders

## 2019-10-12 ENCOUNTER — Other Ambulatory Visit: Payer: Self-pay | Admitting: *Deleted

## 2019-10-12 DIAGNOSIS — B20 Human immunodeficiency virus [HIV] disease: Secondary | ICD-10-CM

## 2019-10-13 ENCOUNTER — Other Ambulatory Visit: Payer: BC Managed Care – PPO

## 2019-10-13 ENCOUNTER — Other Ambulatory Visit: Payer: Self-pay

## 2019-10-13 DIAGNOSIS — B20 Human immunodeficiency virus [HIV] disease: Secondary | ICD-10-CM

## 2019-10-14 LAB — T-HELPER CELL (CD4) - (RCID CLINIC ONLY)
CD4 % Helper T Cell: 45 % (ref 33–65)
CD4 T Cell Abs: 1124 /uL (ref 400–1790)

## 2019-10-16 LAB — COMPLETE METABOLIC PANEL WITH GFR
AG Ratio: 1.8 (calc) (ref 1.0–2.5)
ALT: 11 U/L (ref 9–46)
AST: 14 U/L (ref 10–40)
Albumin: 4.6 g/dL (ref 3.6–5.1)
Alkaline phosphatase (APISO): 42 U/L (ref 36–130)
BUN: 13 mg/dL (ref 7–25)
CO2: 29 mmol/L (ref 20–32)
Calcium: 10 mg/dL (ref 8.6–10.3)
Chloride: 102 mmol/L (ref 98–110)
Creat: 1.23 mg/dL (ref 0.60–1.35)
GFR, Est African American: 88 mL/min/{1.73_m2} (ref >=60)
GFR, Est Non African American: 76 mL/min/{1.73_m2} (ref >=60)
Globulin: 2.6 g/dL (calc) (ref 1.9–3.7)
Glucose, Bld: 94 mg/dL (ref 65–99)
Potassium: 4.2 mmol/L (ref 3.5–5.3)
Sodium: 140 mmol/L (ref 135–146)
Total Bilirubin: 0.3 mg/dL (ref 0.2–1.2)
Total Protein: 7.2 g/dL (ref 6.1–8.1)

## 2019-10-16 LAB — CBC WITH DIFFERENTIAL/PLATELET
Absolute Monocytes: 498 cells/uL (ref 200–950)
Basophils Absolute: 60 cells/uL (ref 0–200)
Basophils Relative: 1 %
Eosinophils Absolute: 162 cells/uL (ref 15–500)
Eosinophils Relative: 2.7 %
HCT: 43 % (ref 38.5–50.0)
Hemoglobin: 14.7 g/dL (ref 13.2–17.1)
Lymphs Abs: 2190 cells/uL (ref 850–3900)
MCH: 31.5 pg (ref 27.0–33.0)
MCHC: 34.2 g/dL (ref 32.0–36.0)
MCV: 92.3 fL (ref 80.0–100.0)
MPV: 10.9 fL (ref 7.5–12.5)
Monocytes Relative: 8.3 %
Neutro Abs: 3090 cells/uL (ref 1500–7800)
Neutrophils Relative %: 51.5 %
Platelets: 253 10*3/uL (ref 140–400)
RBC: 4.66 10*6/uL (ref 4.20–5.80)
RDW: 12.4 % (ref 11.0–15.0)
Total Lymphocyte: 36.5 %
WBC: 6 10*3/uL (ref 3.8–10.8)

## 2019-10-16 LAB — HIV-1 RNA QUANT-NO REFLEX-BLD
HIV 1 RNA Quant: <20 copies/mL
HIV-1 RNA Quant, Log: <1.3 Log copies/mL

## 2019-10-28 ENCOUNTER — Ambulatory Visit: Payer: BC Managed Care – PPO

## 2019-10-28 ENCOUNTER — Other Ambulatory Visit: Payer: Self-pay

## 2019-10-28 ENCOUNTER — Encounter: Payer: Self-pay | Admitting: Infectious Disease

## 2019-10-28 ENCOUNTER — Ambulatory Visit (INDEPENDENT_AMBULATORY_CARE_PROVIDER_SITE_OTHER): Payer: BC Managed Care – PPO | Admitting: Infectious Disease

## 2019-10-28 VITALS — BP 124/85 | HR 73 | Temp 97.9°F | Ht 75.0 in | Wt 213.0 lb

## 2019-10-28 DIAGNOSIS — E349 Endocrine disorder, unspecified: Secondary | ICD-10-CM

## 2019-10-28 DIAGNOSIS — F640 Transsexualism: Secondary | ICD-10-CM

## 2019-10-28 DIAGNOSIS — Z23 Encounter for immunization: Secondary | ICD-10-CM | POA: Diagnosis not present

## 2019-10-28 DIAGNOSIS — B20 Human immunodeficiency virus [HIV] disease: Secondary | ICD-10-CM

## 2019-10-28 DIAGNOSIS — IMO0001 Reserved for inherently not codable concepts without codable children: Secondary | ICD-10-CM

## 2019-10-28 MED ORDER — LUPRON DEPOT (1-MONTH) 3.75 MG IM KIT: 3.7500 mg | PACK | INTRAMUSCULAR | 5 refills | Status: DC

## 2019-10-28 NOTE — Addendum Note (Signed)
Addended by: Robinette Haines on: 10/28/2019 04:38 PM   Modules accepted: Orders

## 2019-10-28 NOTE — Progress Notes (Signed)
Subjective:  Chief complaint: Follow-up for HIV disease  Patient ID: Renee Malone, adult    DOB: 07-13-1986, 34 y.o.   MRN: [id number]  HPI  Renee Malone is a 34 year old African-American transgender female who is living with HIV with perfect virological suppression with a viral load less than 20 consistently every time she has been checked.  She takes [facility] religiously.  She is on spironolactone as well as estradiol patch and .  She has had plastic surgery and is in need of "bottom plastic surgery.    She had trouble with acute elevation in creatinine while on higher dose of spironolactone were looking androgen blockade is a possible adjunct of therapy given the fact that her testosterone levels were higher than were desired at last lab value measurement.  She is also going to be seeing a urologist [facility] that specializes in gender reassignment and alignment with gender identity.  She may need another letter for that one.      Past Medical History:  Diagnosis Date  . Anxiety    panic attacks  . Elevated serum creatinine 06/10/2019  . Heart murmur   . HIV infection [facility])     Past Surgical History:  Procedure Laterality Date  . BREAST ENHANCEMENT SURGERY Bilateral 02/25/2018   Procedure: MAMMOPLASTY AUGMENTATION WITH SILICONE IMPLANTS;  Surgeon: Glenna Fellows, MD;  Location: [facility];  Service: Plastics;  Laterality: Bilateral;  . BREAST LUMPECTOMY Right 02/25/2018   Procedure: EXCISION RIGHT BREAST MASS;  Surgeon: Glenna Fellows, MD;  Location: [facility];  Service: Plastics;  Laterality: Right;  . SKIN GRAFT      Family History  Problem Relation Age of Onset  . Hypertension Maternal Grandmother       Social History   Socioeconomic History  . Marital status: Married    Spouse name: Not on file  . Number of children: Not on file  . Years of education: Not on file  . Highest education level: Not on file  Occupational History  .  Not on file  Tobacco Use  . Smoking status: Never Smoker  . Smokeless tobacco: Never Used  Substance and Sexual Activity  . Alcohol use: Yes    Alcohol/week: 1.0 standard drinks    Types: 1 Standard drinks or equivalent per week    Comment: rarely  . Drug use: No  . Sexual activity: Not Currently    Partners: Male  Other Topics Concern  . Not on file  Social History Narrative   Lives alone.    Social Determinants of Health   Financial Resource Strain:   . Difficulty of Paying Living Expenses: Not on file  Food Insecurity:   . Worried About Programme researcher, broadcasting/film/video in the Last Year: Not on file  . Ran Out of Food in the Last Year: Not on file  Transportation Needs:   . Lack of Transportation (Medical): Not on file  . Lack of Transportation (Non-Medical): Not on file  Physical Activity:   . Days of Exercise per Week: Not on file  . Minutes of Exercise per Session: Not on file  Stress:   . Feeling of Stress : Not on file  Social Connections:   . Frequency of Communication with Friends and Family: Not on file  . Frequency of Social Gatherings with Friends and Family: Not on file  . Attends Religious Services: Not on file  . Active Member of Clubs or Organizations: Not on file  . Attends Banker  Meetings: Not on file  . Marital Status: Not on file    Allergies  Allergen Reactions  . Broccoli [Brassica Oleracea] Anaphylaxis     Current Outpatient Medications:  .  emtricitabine-rilpivir-tenofovir AF (ODEFSEY) 200-25-25 MG TABS tablet, Take 1 tablet by mouth daily., Disp: 30 tablet, Rfl: 11 .  estradiol (CLIMARA - DOSED IN MG/24 HR) 0.025 mg/24hr patch, APPLY 1 PATCH EXTERNALLY TO THE SKIN 1 TIME A WEEK, Disp: 12 patch, Rfl: 0 .  hydrocortisone 1 % ointment, Apply 1 application topically 2 (two) times daily., Disp: 30 g, Rfl: 2 .  medroxyPROGESTERone (PROVERA) 5 MG tablet, TAKE AS DIRECTED, Disp: 30 tablet, Rfl: 0 .  spironolactone (ALDACTONE) 25 MG tablet, TAKE 1  TABLET BY MOUTH TWICE DAILY, Disp: 90 tablet, Rfl: 1   Review of Systems  Constitutional: Negative for activity change, appetite change, chills, diaphoresis, fatigue, fever and unexpected weight change.  HENT: Negative for congestion, rhinorrhea, sinus pressure, sneezing, sore throat and trouble swallowing.   Eyes: Negative for photophobia and visual disturbance.  Respiratory: Negative for cough, chest tightness, shortness of breath, wheezing and stridor.   Cardiovascular: Negative for chest pain, palpitations and leg swelling.  Gastrointestinal: Negative for abdominal distention, abdominal pain, anal bleeding, blood in stool, constipation, diarrhea, nausea and vomiting.  Genitourinary: Negative for difficulty urinating, dysuria, flank pain and hematuria.  Musculoskeletal: Negative for arthralgias, back pain, gait problem, joint swelling and myalgias.  Skin: Negative for color change, pallor, rash and wound.  Neurological: Negative for dizziness, tremors, weakness and light-headedness.  Hematological: Negative for adenopathy. Does not bruise/bleed easily.  Psychiatric/Behavioral: Negative for agitation, behavioral problems, confusion, decreased concentration, dysphoric mood and sleep disturbance.       Objective:   Physical Exam Constitutional:      General: She is not in acute distress.    Appearance: Normal appearance. She is not diaphoretic.  HENT:     Head: Normocephalic and atraumatic.     Right Ear: External ear normal.     Left Ear: External ear normal.     Nose: Nose normal.     Mouth/Throat:     Pharynx: No oropharyngeal exudate.  Eyes:     General: No scleral icterus.    Conjunctiva/sclera: Conjunctivae normal.  Cardiovascular:     Rate and Rhythm: Normal rate and regular rhythm.  Pulmonary:     Effort: Pulmonary effort is normal. No respiratory distress.     Breath sounds: No wheezing.  Abdominal:     Palpations: Abdomen is soft.  Musculoskeletal:        General:  No tenderness. Normal range of motion.     Cervical back: Normal range of motion and neck supple.  Lymphadenopathy:     Cervical: No cervical adenopathy.  Skin:    General: Skin is warm and dry.     Coloration: Skin is not pale.     Findings: No erythema or rash.  Neurological:     General: No focal deficit present.     Mental Status: She is alert and oriented to person, place, and time.     Coordination: Coordination normal.  Psychiatric:        Mood and Affect: Mood normal.        Behavior: Behavior normal.        Thought Content: Thought content normal.        Judgment: Judgment normal.           Assessment & Plan:   HIV: continue Odefsey, check labs  in 6 months  Gender dysphoria: We are looking into optimizing hormonal therapy will continue spironolactone and estradiol to look to addition of androgen blocker

## 2019-10-30 ENCOUNTER — Encounter: Payer: Self-pay | Admitting: Infectious Disease

## 2019-12-06 ENCOUNTER — Ambulatory Visit: Payer: BC Managed Care – PPO | Attending: Internal Medicine

## 2019-12-06 DIAGNOSIS — Z23 Encounter for immunization: Secondary | ICD-10-CM | POA: Insufficient documentation

## 2019-12-06 NOTE — Progress Notes (Signed)
   Covid-19 Vaccination Clinic  Name:  Tylyn Stankovich    MRN: [id number] DOB: 1985/10/24  12/06/2019  Ms. Barbian was observed post Covid-19 immunization for 15 minutes without incidence. She was provided with Vaccine Information Sheet and instruction to access the V-Safe system.   Ms. Choe was instructed to call 911 with any severe reactions post vaccine: Marland Kitchen Difficulty breathing  . Swelling of your face and throat  . A fast heartbeat  . A bad rash all over your body  . Dizziness and weakness    Immunizations Administered    Name Date Dose VIS Date Route   Moderna COVID-19 Vaccine 12/06/2019 11:57 AM 0.5 mL 09/08/2019 Intramuscular   Manufacturer: Moderna   Lot: [id number]   NDC: 16429-037-95

## 2020-01-07 ENCOUNTER — Telehealth: Payer: Self-pay

## 2020-01-07 ENCOUNTER — Other Ambulatory Visit: Payer: Self-pay | Admitting: Infectious Diseases

## 2020-01-07 ENCOUNTER — Other Ambulatory Visit: Payer: Self-pay

## 2020-01-07 DIAGNOSIS — IMO0001 Reserved for inherently not codable concepts without codable children: Secondary | ICD-10-CM

## 2020-01-07 DIAGNOSIS — E349 Endocrine disorder, unspecified: Secondary | ICD-10-CM

## 2020-01-07 MED ORDER — SPIRONOLACTONE 25 MG PO TABS: 25.0000 mg | ORAL_TABLET | Freq: Two times a day (BID) | ORAL | 1 refills | Status: DC

## 2020-01-07 MED ORDER — ESTRADIOL 0.025 MG/24HR TD PTWK: MEDICATED_PATCH | TRANSDERMAL | 0 refills | Status: DC

## 2020-01-07 NOTE — Telephone Encounter (Signed)
She should continue all of these meds. See if Cassie has any other thoughts

## 2020-01-07 NOTE — Telephone Encounter (Signed)
Attempted to call patient to confirm HRT after receiving refill request for Spirolactone. Unable to reach patient at this time.  Lorenso Courier, [place]

## 2020-01-07 NOTE — Telephone Encounter (Signed)
Patient called office today to follow up on missed call. States she was told during last office visit to continue taking Spirolactone, Estradiol patches, and Leuprolide inj. Would like to know if she should continue all three or d/c spirolactone.  During last visit sprio was d/c. Will forward message to MD to clarify patient's HRT regimen. Would like refills sent to [facility].  Lorenso Courier, [place]

## 2020-01-09 ENCOUNTER — Ambulatory Visit: Payer: BC Managed Care – PPO | Attending: Internal Medicine

## 2020-01-09 DIAGNOSIS — Z23 Encounter for immunization: Secondary | ICD-10-CM

## 2020-01-09 NOTE — Progress Notes (Signed)
   Covid-19 Vaccination Clinic  Name:  Lorielle Boehning    MRN: [id number] DOB: 07-25-1986  01/09/2020  Ms. Rogalski was observed post Covid-19 immunization for 15 minutes without incident. She was provided with Vaccine Information Sheet and instruction to access the V-Safe system.   Ms. Faller was instructed to call 911 with any severe reactions post vaccine: Marland Kitchen Difficulty breathing  . Swelling of face and throat  . A fast heartbeat  . A bad rash all over body  . Dizziness and weakness   Immunizations Administered    Name Date Dose VIS Date Route   Moderna COVID-19 Vaccine 01/09/2020 12:15 PM 0.5 mL 09/08/2019 Intramuscular   Manufacturer: Moderna   Lot: [id number]   NDC: 00511-021-11

## 2020-01-11 NOTE — Telephone Encounter (Signed)
Yes, I believe the plan was to continue all three.

## 2020-01-11 NOTE — Telephone Encounter (Signed)
Thanks Cassie 

## 2020-03-07 ENCOUNTER — Encounter (HOSPITAL_BASED_OUTPATIENT_CLINIC_OR_DEPARTMENT_OTHER): Payer: Self-pay | Admitting: *Deleted

## 2020-03-07 ENCOUNTER — Other Ambulatory Visit: Payer: Self-pay

## 2020-03-07 ENCOUNTER — Emergency Department (HOSPITAL_BASED_OUTPATIENT_CLINIC_OR_DEPARTMENT_OTHER)
Admission: EM | Admit: 2020-03-07 | Discharge: 2020-03-07 | Disposition: A | Discharge status: Home / Self Care | Payer: BC Managed Care – PPO | Attending: Emergency Medicine | Admitting: Emergency Medicine

## 2020-03-07 DIAGNOSIS — Z79899 Other long term (current) drug therapy: Secondary | ICD-10-CM | POA: Insufficient documentation

## 2020-03-07 DIAGNOSIS — K0889 Other specified disorders of teeth and supporting structures: Secondary | ICD-10-CM | POA: Diagnosis not present

## 2020-03-07 DIAGNOSIS — B20 Human immunodeficiency virus [HIV] disease: Secondary | ICD-10-CM | POA: Insufficient documentation

## 2020-03-07 MED ORDER — IBUPROFEN 800 MG PO TABS: 800.0000 mg | ORAL_TABLET | Freq: Three times a day (TID) | ORAL | 0 refills | Status: AC

## 2020-03-07 MED ORDER — IBUPROFEN 800 MG PO TABS
800.0000 mg | ORAL_TABLET | Freq: Once | ORAL | Status: AC
  Administered 2020-03-07: 800 mg via ORAL
  Filled 2020-03-07: qty 1

## 2020-03-07 MED ORDER — PENICILLIN V POTASSIUM 500 MG PO TABS: 500.0000 mg | ORAL_TABLET | Freq: Four times a day (QID) | ORAL | 0 refills | Status: AC

## 2020-03-07 NOTE — ED Triage Notes (Signed)
Dental pain. 

## 2020-03-07 NOTE — ED Notes (Addendum)
Rt upper back tooth pain for months this am woke up and it was  Dull, hurts to chew and drink  . Makes her wholoe jaw hurt states she knows it is a bad tooth

## 2020-03-07 NOTE — ED Provider Notes (Signed)
[place] EMERGENCY DEPARTMENT Provider Note   CSN: [id number] Arrival date & time: 03/07/20  1430     History Chief Complaint  Patient presents with  . Dental Pain    Renee Malone is a 34 y.o. adult.  34 y.o Renee Malone with a PMH HIV, Anxiety, Precordial presents to the ED with a chief complaint of dental pain x this morning. Patient states she was told her wisdom teeth needed to be removed about 11 years ago, reported she did not schedule this appointment back then. Today she reports pain to BL upper teeth, worse with drinking and mastication. She has been placing orajel, but without much improvement in symptoms. No fever, no chills, no pain with eye movement.   The history is provided by the patient and medical records.       Past Medical History:  Diagnosis Date  . Anxiety    panic attacks  . Elevated serum creatinine 06/10/2019  . Heart murmur   . HIV infection [facility])     Patient Active Problem List   Diagnosis Date Noted  . Elevated serum creatinine 06/10/2019  . Rash and nonspecific skin eruption 11/02/2015  . Allergic rhinitis 01/28/2013  . Precordial chest pain 12/19/2012  . Tendonitis 06/11/2012  . Depression 01/08/2012  . Hormonal imbalance in transgender patient 05/22/2011  . Human immunodeficiency virus (HIV) disease ([place]) 07/21/2009    Past Surgical History:  Procedure Laterality Date  . BREAST ENHANCEMENT SURGERY Bilateral 02/25/2018   Procedure: MAMMOPLASTY AUGMENTATION WITH SILICONE IMPLANTS;  Surgeon: Irene Limbo, MD;  Location: [place];  Service: Plastics;  Laterality: Bilateral;  . BREAST LUMPECTOMY Right 02/25/2018   Procedure: EXCISION RIGHT BREAST MASS;  Surgeon: Irene Limbo, MD;  Location: [place];  Service: Plastics;  Laterality: Right;  . SKIN GRAFT         Family History  Problem Relation Age of Onset  . Hypertension Maternal Grandmother     Social History    Tobacco Use  . Smoking status: Never Smoker  . Smokeless tobacco: Never Used  Substance Use Topics  . Alcohol use: Yes    Alcohol/week: 1.0 standard drinks    Types: 1 Standard drinks or equivalent per week    Comment: rarely  . Drug use: No    Home Medications Prior to Admission medications   Medication Sig Start Date End Date Taking? Authorizing Provider  ALPRAZolam Duanne Moron) 1 MG tablet TAKE ONE TABLET BY MOUTH EVERY 8 HOURS AS NEEDED Patient not taking: Reported on 06/08/2015 01/10/12   Michel Bickers, MD  emtricitabine-rilpivir-tenofovir AF (ODEFSEY) 200-25-25 MG TABS tablet Take 1 tablet by mouth daily. 06/10/19   Truman Hayward, MD  estradiol [facility] - DOSED IN MG/24 HR) 0.025 mg/24hr patch APPLY 1 PATCH EXTERNALLY TO THE SKIN 1 TIME A WEEK 01/07/20   Tommy Medal, Lavell Islam, MD  hydrocortisone 1 % ointment Apply 1 application topically 2 (two) times daily. 05/27/19   Truman Hayward, MD  ibuprofen (ADVIL) 800 MG tablet Take 1 tablet (800 mg total) by mouth 3 (three) times daily for 7 days. 03/07/20 03/14/20  Janeece Fitting, PA-C  leuprolide (LUPRON DEPOT, 64-MONTH,) 3.75 MG injection Inject 3.75 mg into the muscle every 28 (twenty-eight) days. 10/28/19   Truman Hayward, MD  penicillin v potassium (VEETID) 500 MG tablet Take 1 tablet (500 mg total) by mouth 4 (four) times daily for 7 days. 03/07/20 03/14/20  Janeece Fitting, PA-C  spironolactone (  ALDACTONE) 25 MG tablet Take 1 tablet (25 mg total) by mouth 2 (two) times daily. 01/07/20   Randall Hiss, MD    Allergies    Cy Blamer [place] oleracea]  Review of Systems   Review of Systems  Constitutional: Negative for chills and fever.  HENT: Positive for dental problem.     Physical Exam Updated Vital Signs BP 111/82   Pulse 67   Temp 98.4 F (36.9 C) (Oral)   Resp 18   Ht 6\' 3"  (1.905 m)   Wt 96.6 kg   SpO2 100%   BMI 26.62 kg/m   Physical Exam Vitals and nursing note reviewed.  Constitutional:       Appearance: Normal appearance.  HENT:     Head: Normocephalic and atraumatic.     Nose: Nose normal. No congestion.     Mouth/Throat:     Mouth: Mucous membranes are moist.     Pharynx: Oropharynx is clear. Uvula midline. No posterior oropharyngeal erythema.     Tonsils: No tonsillar exudate or tonsillar abscesses.      Comments: Visible cavity noted to the left upper molar. No palpable wisdom teeth noted. No abscess noted to the area. Mild erythema present.  Cardiovascular:     Rate and Rhythm: Normal rate.     Pulses: Normal pulses.  Pulmonary:     Effort: Pulmonary effort is normal.  Abdominal:     General: Abdomen is flat.  Musculoskeletal:     Cervical back: Normal range of motion and neck supple.  Skin:    General: Skin is warm and dry.  Neurological:     Mental Status: She is alert and oriented to person, place, and time.     ED Results / Procedures / Treatments   Labs (all labs ordered are listed, but only abnormal results are displayed) Labs Reviewed - No data to display  EKG None  Radiology No results found.  Procedures Procedures (including critical care time)  Medications Ordered in ED Medications  ibuprofen (ADVIL) tablet 800 mg (800 mg Oral Given 03/07/20 1528)    ED Course  I have reviewed the triage vital signs and the nursing notes.  Pertinent labs & imaging results that were available during my care of the patient were reviewed by me and considered in my medical decision making (see chart for details).    MDM Rules/Calculators/A&P  Patient with a chief complaint of dental pain.  Reports she was told 11 years ago that she likely need her wisdom teeth removed, she says she is had no problems however pain is now persistent along the back of her upper molars, there is no palpable wisdom tooth noted on the upper or lower aspect, there is mild erythema noted along with a cavity present on the right side of her mouth.  Rating secretions okay, no fever,  chills.  Does report pain with mastication along with drinking.  Has been applying Orajel to the area.  We discussed appropriate treatment with antibiotics to prevent further infection along with pain medication in order to control symptoms.  She will ultimately need a referral to dentist in order to obtain further treatment.  She is aware of this plan, and agrees with management.  Return precautions discussed at length.  Portions of this note were generated with 03/09/20. Dictation errors may occur despite best attempts at proofreading.  Final Clinical Impression(s) / ED Diagnoses Final diagnoses:  Pain, dental    Rx / DC Orders ED  Discharge Orders         Ordered    penicillin v potassium (VEETID) 500 MG tablet  4 times daily     03/07/20 1525    ibuprofen (ADVIL) 800 MG tablet  3 times daily     03/07/20 1525           Claude Manges, PA-C 03/07/20 1529    Melene Plan, DO 03/07/20 1722

## 2020-03-07 NOTE — Discharge Instructions (Addendum)
I have provided a prescription for antibiotics in order to prevent further infection. Please take 1 tablet 4 times a day for the next 7 days.   I have also provided a prescription for pain medication, please take this as needed for pain.  Multiple dental resources are attached to your chart, you will ultimately need to schedule an evaluation with a dentist.

## 2020-03-23 ENCOUNTER — Other Ambulatory Visit: Payer: Self-pay

## 2020-03-23 DIAGNOSIS — IMO0001 Reserved for inherently not codable concepts without codable children: Secondary | ICD-10-CM

## 2020-03-23 MED ORDER — SPIRONOLACTONE 25 MG PO TABS: 25.0000 mg | ORAL_TABLET | Freq: Two times a day (BID) | ORAL | 1 refills | Status: DC

## 2020-03-23 MED ORDER — ESTRADIOL 0.025 MG/24HR TD PTWK: MEDICATED_PATCH | TRANSDERMAL | 0 refills | Status: DC

## 2020-04-21 ENCOUNTER — Other Ambulatory Visit: Payer: Self-pay | Admitting: Infectious Disease

## 2020-04-21 DIAGNOSIS — IMO0001 Reserved for inherently not codable concepts without codable children: Secondary | ICD-10-CM

## 2020-04-25 DIAGNOSIS — Z6825 Body mass index (BMI) 25.0-25.9, adult: Secondary | ICD-10-CM | POA: Diagnosis not present

## 2020-04-25 DIAGNOSIS — F640 Transsexualism: Secondary | ICD-10-CM | POA: Diagnosis not present

## 2020-04-27 ENCOUNTER — Ambulatory Visit: Payer: BC Managed Care – PPO

## 2020-04-27 ENCOUNTER — Other Ambulatory Visit: Payer: BC Managed Care – PPO

## 2020-04-27 ENCOUNTER — Other Ambulatory Visit: Payer: Self-pay

## 2020-04-27 DIAGNOSIS — B20 Human immunodeficiency virus [HIV] disease: Secondary | ICD-10-CM

## 2020-04-27 DIAGNOSIS — IMO0001 Reserved for inherently not codable concepts without codable children: Secondary | ICD-10-CM

## 2020-04-28 LAB — T-HELPER CELL (CD4) - (RCID CLINIC ONLY)
CD4 % Helper T Cell: 47 % (ref 33–65)
CD4 T Cell Abs: 1095 /uL (ref 400–1790)

## 2020-04-29 LAB — COMPLETE METABOLIC PANEL WITH GFR
AG Ratio: 1.7 (calc) (ref 1.0–2.5)
ALT: 12 U/L (ref 9–46)
AST: 14 U/L (ref 10–40)
Albumin: 4.7 g/dL (ref 3.6–5.1)
Alkaline phosphatase (APISO): 38 U/L (ref 36–130)
BUN: 14 mg/dL (ref 7–25)
CO2: 29 mmol/L (ref 20–32)
Calcium: 9.9 mg/dL (ref 8.6–10.3)
Chloride: 104 mmol/L (ref 98–110)
Creat: 1.18 mg/dL (ref 0.60–1.35)
GFR, Est African American: 91 mL/min/{1.73_m2} (ref >=60)
GFR, Est Non African American: 79 mL/min/{1.73_m2} (ref >=60)
Globulin: 2.7 g/dL (calc) (ref 1.9–3.7)
Glucose, Bld: 90 mg/dL (ref 65–99)
Potassium: 4.5 mmol/L (ref 3.5–5.3)
Sodium: 140 mmol/L (ref 135–146)
Total Bilirubin: 0.3 mg/dL (ref 0.2–1.2)
Total Protein: 7.4 g/dL (ref 6.1–8.1)

## 2020-04-29 LAB — CBC WITH DIFFERENTIAL/PLATELET
Absolute Monocytes: 435 cells/uL (ref 200–950)
Basophils Absolute: 52 cells/uL (ref 0–200)
Basophils Relative: 0.9 %
Eosinophils Absolute: 122 cells/uL (ref 15–500)
Eosinophils Relative: 2.1 %
HCT: 38.7 % (ref 38.5–50.0)
Hemoglobin: 12.7 g/dL — ABNORMAL LOW (ref 13.2–17.1)
Lymphs Abs: 2105 cells/uL (ref 850–3900)
MCH: 30.7 pg (ref 27.0–33.0)
MCHC: 32.8 g/dL (ref 32.0–36.0)
MCV: 93.5 fL (ref 80.0–100.0)
MPV: 11.2 fL (ref 7.5–12.5)
Monocytes Relative: 7.5 %
Neutro Abs: 3086 cells/uL (ref 1500–7800)
Neutrophils Relative %: 53.2 %
Platelets: 268 10*3/uL (ref 140–400)
RBC: 4.14 10*6/uL — ABNORMAL LOW (ref 4.20–5.80)
RDW: 12.4 % (ref 11.0–15.0)
Total Lymphocyte: 36.3 %
WBC: 5.8 10*3/uL (ref 3.8–10.8)

## 2020-04-29 LAB — RPR: RPR Ser Ql: NONREACTIVE

## 2020-04-29 LAB — HIV-1 RNA QUANT-NO REFLEX-BLD
HIV 1 RNA Quant: <20 copies/mL
HIV-1 RNA Quant, Log: <1.3 Log copies/mL

## 2020-05-11 ENCOUNTER — Encounter: Payer: BC Managed Care – PPO | Admitting: Infectious Disease

## 2020-05-12 DIAGNOSIS — F4322 Adjustment disorder with anxiety: Secondary | ICD-10-CM | POA: Diagnosis not present

## 2020-05-16 ENCOUNTER — Encounter: Payer: Self-pay | Admitting: Infectious Disease

## 2020-05-23 DIAGNOSIS — F4322 Adjustment disorder with anxiety: Secondary | ICD-10-CM | POA: Diagnosis not present

## 2020-05-25 ENCOUNTER — Other Ambulatory Visit: Payer: Self-pay

## 2020-05-25 ENCOUNTER — Telehealth (INDEPENDENT_AMBULATORY_CARE_PROVIDER_SITE_OTHER): Payer: BC Managed Care – PPO | Admitting: Infectious Disease

## 2020-05-25 ENCOUNTER — Encounter: Payer: Self-pay | Admitting: Infectious Disease

## 2020-05-25 DIAGNOSIS — F640 Transsexualism: Secondary | ICD-10-CM

## 2020-05-25 DIAGNOSIS — R7303 Prediabetes: Secondary | ICD-10-CM

## 2020-05-25 DIAGNOSIS — E349 Endocrine disorder, unspecified: Secondary | ICD-10-CM | POA: Diagnosis not present

## 2020-05-25 DIAGNOSIS — IMO0001 Reserved for inherently not codable concepts without codable children: Secondary | ICD-10-CM

## 2020-05-25 DIAGNOSIS — R7989 Other specified abnormal findings of blood chemistry: Secondary | ICD-10-CM | POA: Diagnosis not present

## 2020-05-25 DIAGNOSIS — B20 Human immunodeficiency virus [HIV] disease: Secondary | ICD-10-CM

## 2020-05-25 DIAGNOSIS — D649 Anemia, unspecified: Secondary | ICD-10-CM | POA: Diagnosis not present

## 2020-05-25 HISTORY — DX: Prediabetes: R73.03

## 2020-05-25 HISTORY — DX: Anemia, unspecified: D64.9

## 2020-05-25 NOTE — Progress Notes (Signed)
Virtual Visit via Telephone Note  I connected with Renee Malone on 05/25/20 at 11:15 AM EDT by telephone and verified that I am speaking with the correct person using two identifiers.  Location: Patient: Home Provider: [facility]   I discussed the limitations, risks, security and privacy concerns of performing an evaluation and management service by telephone and the availability of in person appointments. I also discussed with the patient that there may be a patient responsible charge related to this service. The patient expressed understanding and agreed to proceed.   History of Present Illness:  Renee Malone 34 year old African-American transgender woman living with HIV that is been perfectly suppressed on Odefsey.  She is currently on Aldactone, estradiol patch and Lupron injection to assist with female to female transformation.  She also is going to undergo gender reassignment surgery at [facility] urology.  Unfortunately the COVID-19 pandemic is raging again and I am skeptical she will build to get her elective surgery done anytime soon.  We reviewed her labs and she was concerned about the RBC hemoglobin being low.  His labs do reflect a change that could certainly be due to lab error.  She has changed her diet recently cutting out sugary drinks and attempt to lose weight when she was informed that she was prediabetic by her primary care physician.  I do not think this would explain the hemoglobin going down though.     Observations/Objective: Does not appear to be doing well and comfortable she is a bit frustrated with possibility it she may not build to have her surgery soon.    Assessment and Plan: HIV disease: Continue Odefsey with food and avoidance of antacids  Transgender female: Continue current medications and she will be having gender reassignment surgery I will check a testosterone level when she comes back for labs in January.  Low hemoglobin: This is probably a lab  error but we will repeat the CBC next week.    Follow Up Instructions:    I discussed the assessment and treatment plan with the patient. The patient was provided an opportunity to ask questions and all were answered. The patient agreed with the plan and demonstrated an understanding of the instructions.   The patient was advised to call back or seek an in-person evaluation if the symptoms worsen or if the condition fails to improve as anticipated.  I provided  21 minutes of non-face-to-face time during this encounter.   Acey Lav, MD

## 2020-05-31 ENCOUNTER — Other Ambulatory Visit: Payer: BC Managed Care – PPO

## 2020-06-02 DIAGNOSIS — F4322 Adjustment disorder with anxiety: Secondary | ICD-10-CM | POA: Diagnosis not present

## 2020-06-09 ENCOUNTER — Other Ambulatory Visit: Payer: BC Managed Care – PPO

## 2020-06-09 ENCOUNTER — Other Ambulatory Visit: Payer: Self-pay

## 2020-06-09 DIAGNOSIS — D649 Anemia, unspecified: Secondary | ICD-10-CM | POA: Diagnosis not present

## 2020-06-09 DIAGNOSIS — IMO0001 Reserved for inherently not codable concepts without codable children: Secondary | ICD-10-CM

## 2020-06-09 DIAGNOSIS — F640 Transsexualism: Secondary | ICD-10-CM | POA: Diagnosis not present

## 2020-06-09 DIAGNOSIS — B20 Human immunodeficiency virus [HIV] disease: Secondary | ICD-10-CM

## 2020-06-09 DIAGNOSIS — F4323 Adjustment disorder with mixed anxiety and depressed mood: Secondary | ICD-10-CM | POA: Diagnosis not present

## 2020-06-09 DIAGNOSIS — E349 Endocrine disorder, unspecified: Secondary | ICD-10-CM | POA: Diagnosis not present

## 2020-06-10 LAB — T-HELPER CELL (CD4) - (RCID CLINIC ONLY)
CD4 % Helper T Cell: 48 % (ref 33–65)
CD4 T Cell Abs: 977 /uL (ref 400–1790)

## 2020-06-11 LAB — CBC WITH DIFFERENTIAL/PLATELET
Absolute Monocytes: 442 cells/uL (ref 200–950)
Basophils Absolute: 59 cells/uL (ref 0–200)
Basophils Relative: 0.9 %
Eosinophils Absolute: 158 cells/uL (ref 15–500)
Eosinophils Relative: 2.4 %
HCT: 39.3 % (ref 38.5–50.0)
Hemoglobin: 13.3 g/dL (ref 13.2–17.1)
Lymphs Abs: 2086 cells/uL (ref 850–3900)
MCH: 31.6 pg (ref 27.0–33.0)
MCHC: 33.8 g/dL (ref 32.0–36.0)
MCV: 93.3 fL (ref 80.0–100.0)
MPV: 11.3 fL (ref 7.5–12.5)
Monocytes Relative: 6.7 %
Neutro Abs: 3854 cells/uL (ref 1500–7800)
Neutrophils Relative %: 58.4 %
Platelets: 240 10*3/uL (ref 140–400)
RBC: 4.21 10*6/uL (ref 4.20–5.80)
RDW: 12.1 % (ref 11.0–15.0)
Total Lymphocyte: 31.6 %
WBC: 6.6 10*3/uL (ref 3.8–10.8)

## 2020-06-11 LAB — LIPID PANEL
Cholesterol: 183 mg/dL (ref <200)
HDL: 57 mg/dL (ref >=40)
LDL Cholesterol (Calc): 100 mg/dL (calc) — ABNORMAL HIGH
Non-HDL Cholesterol (Calc): 126 mg/dL (calc) (ref <130)
Total CHOL/HDL Ratio: 3.2 (calc) (ref <5.0)
Triglycerides: 154 mg/dL — ABNORMAL HIGH (ref <150)

## 2020-06-11 LAB — HIV-1 RNA QUANT-NO REFLEX-BLD
HIV 1 RNA Quant: <20 Copies/mL
HIV-1 RNA Quant, Log: <1.3 Log cps/mL

## 2020-06-11 LAB — COMPLETE METABOLIC PANEL WITH GFR
AG Ratio: 1.8 (calc) (ref 1.0–2.5)
ALT: 9 U/L (ref 9–46)
AST: 13 U/L (ref 10–40)
Albumin: 4.6 g/dL (ref 3.6–5.1)
Alkaline phosphatase (APISO): 42 U/L (ref 36–130)
BUN: 16 mg/dL (ref 7–25)
CO2: 30 mmol/L (ref 20–32)
Calcium: 10 mg/dL (ref 8.6–10.3)
Chloride: 103 mmol/L (ref 98–110)
Creat: 1.16 mg/dL (ref 0.60–1.35)
GFR, Est African American: 93 mL/min/{1.73_m2} (ref >=60)
GFR, Est Non African American: 81 mL/min/{1.73_m2} (ref >=60)
Globulin: 2.6 g/dL (calc) (ref 1.9–3.7)
Glucose, Bld: 99 mg/dL (ref 65–99)
Potassium: 4.5 mmol/L (ref 3.5–5.3)
Sodium: 139 mmol/L (ref 135–146)
Total Bilirubin: 0.3 mg/dL (ref 0.2–1.2)
Total Protein: 7.2 g/dL (ref 6.1–8.1)

## 2020-06-11 LAB — TESTOSTERONE: Testosterone: <10 ng/dL — ABNORMAL LOW (ref 250–827)

## 2020-06-11 LAB — RPR: RPR Ser Ql: NONREACTIVE

## 2020-06-23 ENCOUNTER — Other Ambulatory Visit: Payer: Self-pay | Admitting: Infectious Disease

## 2020-06-23 DIAGNOSIS — B20 Human immunodeficiency virus [HIV] disease: Secondary | ICD-10-CM

## 2020-06-23 DIAGNOSIS — IMO0001 Reserved for inherently not codable concepts without codable children: Secondary | ICD-10-CM

## 2020-06-23 DIAGNOSIS — F4322 Adjustment disorder with anxiety: Secondary | ICD-10-CM | POA: Diagnosis not present

## 2020-07-04 DIAGNOSIS — F640 Transsexualism: Secondary | ICD-10-CM | POA: Diagnosis not present

## 2020-07-06 ENCOUNTER — Telehealth: Payer: Self-pay

## 2020-07-06 NOTE — Telephone Encounter (Signed)
-----   Message from Randall Hiss, MD sent at 07/06/2020  9:38 AM EDT ----- Regarding: RE: Low Testosterone Please have Tereka hold her Lupron injections and I don't want to have her develop a pathological fracture. We want her to be feminized but not get any broken bones. We should recheck level one month after stopping this drug. Cassie you think ok continue aldactobe and estradiol? ----- Message ----- From: Phill Myron, RN Sent: 07/06/2020   8:45 AM EDT To: Randall Hiss, MD Subject: Low Testosterone                               Chesk her labs

## 2020-07-06 NOTE — Telephone Encounter (Signed)
I am covering for Dr Daiva Eves. Please let me know which labs to order?

## 2020-07-06 NOTE — Telephone Encounter (Signed)
Error

## 2020-07-06 NOTE — Telephone Encounter (Signed)
Called patient to relay message from Dr. Daiva Eves. Patient's last injection was 1 month ago. Scheduled patient for lab appointment this Thursday tomorrow for 07/07/2020 at 2:15 p.m. Patient does not have any questions at this time. Will forward message to provider for lab orders. Renee Malone

## 2020-07-07 ENCOUNTER — Other Ambulatory Visit: Payer: Self-pay

## 2020-07-07 ENCOUNTER — Other Ambulatory Visit: Payer: BC Managed Care – PPO

## 2020-07-07 DIAGNOSIS — B20 Human immunodeficiency virus [HIV] disease: Secondary | ICD-10-CM

## 2020-07-07 DIAGNOSIS — F640 Transsexualism: Secondary | ICD-10-CM | POA: Diagnosis not present

## 2020-07-07 DIAGNOSIS — IMO0001 Reserved for inherently not codable concepts without codable children: Secondary | ICD-10-CM

## 2020-07-07 DIAGNOSIS — D649 Anemia, unspecified: Secondary | ICD-10-CM | POA: Diagnosis not present

## 2020-07-07 DIAGNOSIS — E349 Endocrine disorder, unspecified: Secondary | ICD-10-CM | POA: Diagnosis not present

## 2020-07-07 NOTE — Telephone Encounter (Signed)
Can just check an am testosterone along w routine HIV labs VL CD4 RPR checking for GC chlamydia

## 2020-07-08 LAB — COMPREHENSIVE METABOLIC PANEL
AG Ratio: 2 (calc) (ref 1.0–2.5)
ALT: 15 U/L (ref 9–46)
AST: 17 U/L (ref 10–40)
Albumin: 4.8 g/dL (ref 3.6–5.1)
Alkaline phosphatase (APISO): 41 U/L (ref 36–130)
BUN: 18 mg/dL (ref 7–25)
CO2: 31 mmol/L (ref 20–32)
Calcium: 10.2 mg/dL (ref 8.6–10.3)
Chloride: 100 mmol/L (ref 98–110)
Creat: 1.35 mg/dL (ref 0.60–1.35)
Globulin: 2.4 g/dL (calc) (ref 1.9–3.7)
Glucose, Bld: 87 mg/dL (ref 65–99)
Potassium: 5 mmol/L (ref 3.5–5.3)
Sodium: 139 mmol/L (ref 135–146)
Total Bilirubin: 0.4 mg/dL (ref 0.2–1.2)
Total Protein: 7.2 g/dL (ref 6.1–8.1)

## 2020-07-08 LAB — ESTRADIOL: Estradiol: <15 pg/mL (ref <=39)

## 2020-07-08 LAB — MICROALBUMIN / CREATININE URINE RATIO
Creatinine, Urine: 206 mg/dL (ref 20–320)
Microalb Creat Ratio: 3 mcg/mg creat (ref <30)
Microalb, Ur: 0.7 mg/dL

## 2020-07-08 LAB — TESTOSTERONE: Testosterone: 10 ng/dL — ABNORMAL LOW (ref 250–827)

## 2020-07-12 ENCOUNTER — Telehealth: Payer: Self-pay

## 2020-07-12 ENCOUNTER — Encounter: Payer: Self-pay | Admitting: Infectious Diseases

## 2020-07-12 NOTE — Telephone Encounter (Signed)
Patient scheduled to have surgery with Dr. Guy Sandifer 10/12 however insurance is requesting an updated letter from provider. Letter previously used was from 2019 and they require a more recent letter. Provider notified and letter sent via mychart. Patient notified.    Teandra Harlan Loyola Mast, RN

## 2020-07-16 DIAGNOSIS — Z20822 Contact with and (suspected) exposure to covid-19: Secondary | ICD-10-CM | POA: Diagnosis not present

## 2020-07-19 DIAGNOSIS — Z21 Asymptomatic human immunodeficiency virus [HIV] infection status: Secondary | ICD-10-CM | POA: Diagnosis not present

## 2020-07-19 DIAGNOSIS — F649 Gender identity disorder, unspecified: Secondary | ICD-10-CM | POA: Diagnosis not present

## 2020-07-19 DIAGNOSIS — N3289 Other specified disorders of bladder: Secondary | ICD-10-CM | POA: Diagnosis not present

## 2020-07-19 DIAGNOSIS — F640 Transsexualism: Secondary | ICD-10-CM | POA: Diagnosis not present

## 2020-07-26 DIAGNOSIS — F640 Transsexualism: Secondary | ICD-10-CM | POA: Diagnosis not present

## 2020-07-26 DIAGNOSIS — Z09 Encounter for follow-up examination after completed treatment for conditions other than malignant neoplasm: Secondary | ICD-10-CM | POA: Diagnosis not present

## 2020-08-01 DIAGNOSIS — F640 Transsexualism: Secondary | ICD-10-CM | POA: Diagnosis not present

## 2020-08-02 ENCOUNTER — Telehealth: Payer: Self-pay

## 2020-08-02 ENCOUNTER — Other Ambulatory Visit: Payer: Self-pay

## 2020-08-02 DIAGNOSIS — IMO0001 Reserved for inherently not codable concepts without codable children: Secondary | ICD-10-CM

## 2020-08-02 DIAGNOSIS — Z789 Other specified health status: Secondary | ICD-10-CM

## 2020-08-02 NOTE — Telephone Encounter (Signed)
Patient called office today with concerns regarding recent gender affirming surgery. Patient states that since being discharged from [facility] she has been experiencing bad night sweats. Patient is concerned her current hormone levels are causing the night sweats and would like to know if she needs a new dose.  Patient is currently on Estradiol 0.025 patch and Spirolactone 25 mg tablet.  [facility] advised patient to contact HRT ordering provider regarding this concern. Night sweats mainly happen at night. Usually wakes up drenched in sweat. Patient would also like to know if she can be scheduled for labs to check hormone levels as well as do complete work up. Will forward message to provider to advise on labs. Lorenso Courier, [place]

## 2020-08-02 NOTE — Progress Notes (Signed)
Patient concerned with night sweats after being discharged from [facility] post gender affirming surgery. Per Dr. Daiva Eves he would like lab work up to determine if patient needs additional referrals.  No changes to HRT at this time.  Renee Malone

## 2020-08-02 NOTE — Telephone Encounter (Signed)
Spoke with Dr. Daiva Eves regarding patient's concerns. Per Dr. Daiva Eves he would like for patient to come in the office for complete lab workup. Orders are in epic. Once results are back he will contact patient and decided if patient need additional referrals.  Valarie Cones

## 2020-08-02 NOTE — Telephone Encounter (Signed)
Thx so much!

## 2020-08-02 NOTE — Telephone Encounter (Signed)
Spoke with patient regarding Md's message. Is scheduled for labs tomorrow.  Lorenso Courier, [place]

## 2020-08-03 ENCOUNTER — Other Ambulatory Visit: Payer: BC Managed Care – PPO

## 2020-08-03 ENCOUNTER — Other Ambulatory Visit: Payer: Self-pay

## 2020-08-03 DIAGNOSIS — E349 Endocrine disorder, unspecified: Secondary | ICD-10-CM | POA: Diagnosis not present

## 2020-08-03 DIAGNOSIS — Z789 Other specified health status: Secondary | ICD-10-CM | POA: Diagnosis not present

## 2020-08-03 DIAGNOSIS — IMO0001 Reserved for inherently not codable concepts without codable children: Secondary | ICD-10-CM

## 2020-08-10 ENCOUNTER — Telehealth: Payer: Self-pay

## 2020-08-10 ENCOUNTER — Other Ambulatory Visit: Payer: Self-pay | Admitting: Infectious Disease

## 2020-08-10 DIAGNOSIS — Z87890 Personal history of sex reassignment: Secondary | ICD-10-CM

## 2020-08-10 LAB — TSH: TSH: 1.21 mIU/L (ref 0.40–4.50)

## 2020-08-10 LAB — PROLACTIN: Prolactin: 5.5 ng/mL (ref 2.0–18.0)

## 2020-08-10 LAB — TESTOSTERONE: Testosterone: <10 ng/dL — ABNORMAL LOW (ref 250–827)

## 2020-08-10 LAB — ESTROGENS, TOTAL: Estrogen: 117.9 pg/mL (ref 60–190)

## 2020-08-10 LAB — PROGESTERONE: Progesterone: <0.5 ng/mL (ref <1.4)

## 2020-08-10 LAB — FSH/LH
FSH: 9.7 m[IU]/mL — ABNORMAL HIGH (ref 1.6–8.0)
LH: 6.4 m[IU]/mL (ref 1.5–9.3)

## 2020-08-10 NOTE — Telephone Encounter (Signed)
Sent patient a MyChart message regarding order for referral. Valarie Cones

## 2020-08-10 NOTE — Telephone Encounter (Signed)
Patient made aware of recent lab results and also made aware that Dr. Daiva Eves will consult with endocrinologist and possibly get patient in to be seen by Dr. Everardo All.  Patient verbalized understanding.  Valarie Cones

## 2020-08-10 NOTE — Telephone Encounter (Signed)
-----   Message from Randall Hiss, MD sent at 08/10/2020  9:41 AM EDT ----- I am asking Dr. Everardo All for advice and if he could potentially see pt

## 2020-08-15 DIAGNOSIS — R0789 Other chest pain: Secondary | ICD-10-CM | POA: Diagnosis not present

## 2020-08-17 DIAGNOSIS — F640 Transsexualism: Secondary | ICD-10-CM | POA: Diagnosis not present

## 2020-08-17 DIAGNOSIS — Z09 Encounter for follow-up examination after completed treatment for conditions other than malignant neoplasm: Secondary | ICD-10-CM | POA: Diagnosis not present

## 2020-08-23 DIAGNOSIS — F640 Transsexualism: Secondary | ICD-10-CM | POA: Diagnosis not present

## 2020-09-05 ENCOUNTER — Encounter: Payer: Self-pay | Admitting: Endocrinology

## 2020-09-07 DIAGNOSIS — Z09 Encounter for follow-up examination after completed treatment for conditions other than malignant neoplasm: Secondary | ICD-10-CM | POA: Diagnosis not present

## 2020-09-07 DIAGNOSIS — F640 Transsexualism: Secondary | ICD-10-CM | POA: Diagnosis not present

## 2020-09-28 ENCOUNTER — Other Ambulatory Visit: Payer: Self-pay | Admitting: Infectious Diseases

## 2020-09-28 DIAGNOSIS — Z09 Encounter for follow-up examination after completed treatment for conditions other than malignant neoplasm: Secondary | ICD-10-CM | POA: Diagnosis not present

## 2020-09-28 DIAGNOSIS — IMO0001 Reserved for inherently not codable concepts without codable children: Secondary | ICD-10-CM

## 2020-09-28 DIAGNOSIS — F640 Transsexualism: Secondary | ICD-10-CM | POA: Diagnosis not present

## 2020-10-11 ENCOUNTER — Other Ambulatory Visit: Payer: BC Managed Care – PPO

## 2020-10-11 ENCOUNTER — Other Ambulatory Visit: Payer: Self-pay

## 2020-10-11 DIAGNOSIS — B20 Human immunodeficiency virus [HIV] disease: Secondary | ICD-10-CM

## 2020-10-11 DIAGNOSIS — Z113 Encounter for screening for infections with a predominantly sexual mode of transmission: Secondary | ICD-10-CM

## 2020-10-26 ENCOUNTER — Other Ambulatory Visit: Payer: Self-pay

## 2020-10-27 ENCOUNTER — Ambulatory Visit (INDEPENDENT_AMBULATORY_CARE_PROVIDER_SITE_OTHER): Payer: BC Managed Care – PPO | Admitting: Endocrinology

## 2020-10-27 VITALS — BP 110/70 | HR 82 | Ht 75.0 in | Wt 201.8 lb

## 2020-10-27 DIAGNOSIS — Z789 Other specified health status: Secondary | ICD-10-CM | POA: Diagnosis not present

## 2020-10-27 DIAGNOSIS — IMO0001 Reserved for inherently not codable concepts without codable children: Secondary | ICD-10-CM

## 2020-10-27 DIAGNOSIS — E349 Endocrine disorder, unspecified: Secondary | ICD-10-CM

## 2020-10-27 MED ORDER — ESTRADIOL 0.0375 MG/24HR TD PTWK: 0.0375 mg | MEDICATED_PATCH | TRANSDERMAL | 12 refills | Status: DC

## 2020-10-27 NOTE — Progress Notes (Signed)
Subjective:    Patient ID: Renee Malone, adult    DOB: 1986/10/02, 35 y.o.   MRN: [id number]  HPI Pt is referred by for transgender state (M to F).  she had puberty at the age of 79. she has no biological children.  she has no h/o DVT, dyslipidemia, liver disease, kidney disease, migraine, sleep apnea, heart disease, CVA, acne, diabetes, smoking, HTN, epilepsy, violent behavior, gallbladder disease, blood disorders, osteoporosis, or cancer.  She had breast implants in 2019 ([place]), and vaginoplasty was in 2021 [facility]).  She has been on ERT since 2011.  She last saw counselor in 2021.   pt states she feels well in general, except for "hot flashes."  She stopped aldactone and Lupron in 2021.  She now takes E2 only.   Past Medical History:  Diagnosis Date  . Anxiety    panic attacks  . Elevated serum creatinine 06/10/2019  . Heart murmur   . HIV infection ([facility])   . Low hemoglobin 05/25/2020  . Prediabetes 05/25/2020    Past Surgical History:  Procedure Laterality Date  . BREAST ENHANCEMENT SURGERY Bilateral 02/25/2018   Procedure: MAMMOPLASTY AUGMENTATION WITH SILICONE IMPLANTS;  Surgeon: Glenna Fellows, MD;  Location: [facility];  Service: Plastics;  Laterality: Bilateral;  . BREAST LUMPECTOMY Right 02/25/2018   Procedure: EXCISION RIGHT BREAST MASS;  Surgeon: Glenna Fellows, MD;  Location: [facility];  Service: Plastics;  Laterality: Right;  . SKIN GRAFT      Social History   Socioeconomic History  . Marital status: Married    Spouse name: Not on file  . Number of children: Not on file  . Years of education: Not on file  . Highest education level: Not on file  Occupational History  . Not on file  Tobacco Use  . Smoking status: Never Smoker  . Smokeless tobacco: Never Used  Substance and Sexual Activity  . Alcohol use: Yes    Alcohol/week: 1.0 standard drink    Types: 1 Standard drinks or equivalent per week    Comment: rarely  . Drug  use: No  . Sexual activity: Not Currently    Partners: Male  Other Topics Concern  . Not on file  Social History Narrative   Lives alone.    Social Determinants of Health   Financial Resource Strain: Not on file  Food Insecurity: Not on file  Transportation Needs: Not on file  Physical Activity: Not on file  Stress: Not on file  Social Connections: Not on file  Intimate Partner Violence: Not on file    Current Outpatient Medications on File Prior to Visit  Medication Sig Dispense Refill  . hydrocortisone 1 % ointment Apply 1 application topically 2 (two) times daily. 30 g 2  . ODEFSEY 200-25-25 MG TABS tablet TAKE 1 TABLET BY MOUTH ONCE DAILY 30 tablet 5  . [CANCELED] ALPRAZolam (XANAX) 1 MG tablet TAKE ONE TABLET BY MOUTH EVERY 8 HOURS AS NEEDED (Patient not taking: Reported on 06/08/2015) 60 tablet 2   No current facility-administered medications on file prior to visit.    Allergies  Allergen Reactions  . Broccoli [Brassica Oleracea] Anaphylaxis    Family History  Problem Relation Age of Onset  . Hypertension Maternal Grandmother     BP 110/70 (BP Location: Right Arm, Patient Position: Sitting, Cuff Size: Normal)   Pulse 82   Ht 6\' 3"  (1.905 m)   Wt 201 lb 12.8 oz (91.5 kg)   SpO2 96%  BMI 25.22 kg/m     Review of Systems Denies weight change, sob, nausea, acne, myalgias, and anxiety.      Objective:   Physical Exam VS: see vs page GEN: no distress HEAD: head: no deformity eyes: no periorbital swelling, no proptosis external nose and ears are normal NECK: supple, thyroid is not enlarged CHEST WALL: no deformity BREASTS: declined LUNGS: clear to auscultation CV: reg rate and rhythm, no murmur.  GENITALIA: declined MUSCULOSKELETAL: gait is normal and steady EXTEMITIES: no deformity.  no leg edema NEURO:  readily moves all 4's.  sensation is intact to touch on all 4's SKIN:  Normal texture and temperature.  No rash or suspicious lesion is visible.    NODES:  None palpable at the neck PSYCH: alert, well-oriented.  Does not appear anxious nor depressed.  I have reviewed outside records, and summarized:  Pt was noted to have transgender state, and referred here.  She was seen for postop visit at [facility].  Main issue addressed was need continue vaginal dilation.    Lab Results  Component Value Date   TSH 1.21 08/03/2020   I have reviewed outside records, and summarized:  Pt was noted to have transgender state, and referred here.  After vaginoplasty, she has been working with dilator, with limited success.      Assessment & Plan:  Transgender state, new to me.  We discussed continuing same E2 patch vs increasing.  She chooses to increase.    Patient Instructions  I have sent a prescription to your pharmacy, to increase the estrogen patch. Please redo the blood test in approx 1 month.   Please come back for a follow-up appointment in 6 months

## 2020-10-27 NOTE — Patient Instructions (Signed)
I have sent a prescription to your pharmacy, to increase the estrogen patch. Please redo the blood test in approx 1 month.   Please come back for a follow-up appointment in 6 months

## 2020-11-02 ENCOUNTER — Ambulatory Visit: Payer: BC Managed Care – PPO | Admitting: Infectious Disease

## 2020-11-29 ENCOUNTER — Other Ambulatory Visit: Payer: Self-pay

## 2020-11-29 ENCOUNTER — Other Ambulatory Visit (INDEPENDENT_AMBULATORY_CARE_PROVIDER_SITE_OTHER): Payer: BC Managed Care – PPO

## 2020-11-29 DIAGNOSIS — E349 Endocrine disorder, unspecified: Secondary | ICD-10-CM

## 2020-11-29 DIAGNOSIS — Z789 Other specified health status: Secondary | ICD-10-CM | POA: Diagnosis not present

## 2020-11-29 DIAGNOSIS — IMO0001 Reserved for inherently not codable concepts without codable children: Secondary | ICD-10-CM

## 2020-12-08 DIAGNOSIS — F640 Transsexualism: Secondary | ICD-10-CM | POA: Diagnosis not present

## 2020-12-08 LAB — ESTRADIOL, FREE
Estradiol, Free: 0.06 pg/mL
Estradiol: 3 pg/mL (ref <=29)

## 2020-12-22 DIAGNOSIS — F640 Transsexualism: Secondary | ICD-10-CM | POA: Diagnosis not present

## 2020-12-27 ENCOUNTER — Other Ambulatory Visit: Payer: Self-pay | Admitting: Infectious Disease

## 2020-12-27 DIAGNOSIS — B20 Human immunodeficiency virus [HIV] disease: Secondary | ICD-10-CM

## 2020-12-27 DIAGNOSIS — E349 Endocrine disorder, unspecified: Secondary | ICD-10-CM

## 2020-12-27 DIAGNOSIS — F640 Transsexualism: Secondary | ICD-10-CM | POA: Diagnosis not present

## 2020-12-27 DIAGNOSIS — IMO0001 Reserved for inherently not codable concepts without codable children: Secondary | ICD-10-CM

## 2021-01-16 DIAGNOSIS — F640 Transsexualism: Secondary | ICD-10-CM | POA: Diagnosis not present

## 2021-01-16 DIAGNOSIS — Z6827 Body mass index (BMI) 27.0-27.9, adult: Secondary | ICD-10-CM | POA: Diagnosis not present

## 2021-01-24 ENCOUNTER — Other Ambulatory Visit: Payer: Self-pay | Admitting: Infectious Disease

## 2021-01-24 ENCOUNTER — Telehealth: Payer: Self-pay

## 2021-01-24 DIAGNOSIS — B20 Human immunodeficiency virus [HIV] disease: Secondary | ICD-10-CM

## 2021-01-24 NOTE — Telephone Encounter (Signed)
Called patient to get an appointment scheduled, left a voicemail to call us back and get that scheduled 

## 2021-01-26 ENCOUNTER — Other Ambulatory Visit: Payer: Self-pay

## 2021-01-26 DIAGNOSIS — B20 Human immunodeficiency virus [HIV] disease: Secondary | ICD-10-CM

## 2021-01-26 DIAGNOSIS — Z113 Encounter for screening for infections with a predominantly sexual mode of transmission: Secondary | ICD-10-CM

## 2021-01-26 DIAGNOSIS — Z79899 Other long term (current) drug therapy: Secondary | ICD-10-CM

## 2021-01-27 ENCOUNTER — Other Ambulatory Visit: Payer: BC Managed Care – PPO

## 2021-01-30 ENCOUNTER — Other Ambulatory Visit (HOSPITAL_COMMUNITY)
Admission: RE | Admit: 2021-01-30 | Discharge: 2021-01-30 | Disposition: A | Discharge status: Home / Self Care | Payer: BC Managed Care – PPO | Source: Ambulatory Visit | Attending: Infectious Disease | Admitting: Infectious Disease

## 2021-01-30 ENCOUNTER — Other Ambulatory Visit: Payer: Self-pay

## 2021-01-30 ENCOUNTER — Other Ambulatory Visit: Payer: BC Managed Care – PPO

## 2021-01-30 DIAGNOSIS — Z113 Encounter for screening for infections with a predominantly sexual mode of transmission: Secondary | ICD-10-CM | POA: Diagnosis not present

## 2021-01-30 DIAGNOSIS — B20 Human immunodeficiency virus [HIV] disease: Secondary | ICD-10-CM

## 2021-01-30 DIAGNOSIS — Z79899 Other long term (current) drug therapy: Secondary | ICD-10-CM

## 2021-01-31 ENCOUNTER — Encounter: Payer: Self-pay | Admitting: Endocrinology

## 2021-01-31 LAB — URINE CYTOLOGY ANCILLARY ONLY
Chlamydia: NEGATIVE
Comment: NEGATIVE
Comment: NORMAL
Neisseria Gonorrhea: NEGATIVE

## 2021-01-31 LAB — T-HELPER CELL (CD4) - (RCID CLINIC ONLY)
CD4 % Helper T Cell: 49 % (ref 33–65)
CD4 T Cell Abs: 929 /uL (ref 400–1790)

## 2021-02-01 LAB — COMPLETE METABOLIC PANEL WITH GFR
AG Ratio: 1.7 (calc) (ref 1.0–2.5)
ALT: 14 U/L (ref 6–29)
AST: 18 U/L (ref 10–30)
Albumin: 4.4 g/dL (ref 3.6–5.1)
Alkaline phosphatase (APISO): 54 U/L (ref 31–125)
BUN/Creatinine Ratio: 16 (calc) (ref 6–22)
BUN: 18 mg/dL (ref 7–25)
CO2: 27 mmol/L (ref 20–32)
Calcium: 9.5 mg/dL (ref 8.6–10.2)
Chloride: 103 mmol/L (ref 98–110)
Creat: 1.12 mg/dL — ABNORMAL HIGH (ref 0.50–1.10)
GFR, Est African American: 73 mL/min/{1.73_m2} (ref >=60)
GFR, Est Non African American: 63 mL/min/{1.73_m2} (ref >=60)
Globulin: 2.6 g/dL (calc) (ref 1.9–3.7)
Glucose, Bld: 92 mg/dL (ref 65–99)
Potassium: 4.2 mmol/L (ref 3.5–5.3)
Sodium: 139 mmol/L (ref 135–146)
Total Bilirubin: 0.4 mg/dL (ref 0.2–1.2)
Total Protein: 7 g/dL (ref 6.1–8.1)

## 2021-02-01 LAB — HIV-1 RNA QUANT-NO REFLEX-BLD
HIV 1 RNA Quant: NOT DETECTED Copies/mL
HIV-1 RNA Quant, Log: NOT DETECTED Log cps/mL

## 2021-02-01 LAB — LIPID PANEL
Cholesterol: 169 mg/dL (ref <200)
HDL: 67 mg/dL (ref >=50)
LDL Cholesterol (Calc): 78 mg/dL (calc)
Non-HDL Cholesterol (Calc): 102 mg/dL (calc) (ref <130)
Total CHOL/HDL Ratio: 2.5 (calc) (ref <5.0)
Triglycerides: 137 mg/dL (ref <150)

## 2021-02-01 LAB — CBC WITH DIFFERENTIAL/PLATELET
Absolute Monocytes: 474 cells/uL (ref 200–950)
Basophils Absolute: 42 cells/uL (ref 0–200)
Basophils Relative: 0.7 %
Eosinophils Absolute: 90 cells/uL (ref 15–500)
Eosinophils Relative: 1.5 %
HCT: 34.7 % — ABNORMAL LOW (ref 35.0–45.0)
Hemoglobin: 11.7 g/dL (ref 11.7–15.5)
Lymphs Abs: 2070 cells/uL (ref 850–3900)
MCH: 30.6 pg (ref 27.0–33.0)
MCHC: 33.7 g/dL (ref 32.0–36.0)
MCV: 90.8 fL (ref 80.0–100.0)
MPV: 10.9 fL (ref 7.5–12.5)
Monocytes Relative: 7.9 %
Neutro Abs: 3324 cells/uL (ref 1500–7800)
Neutrophils Relative %: 55.4 %
Platelets: 232 10*3/uL (ref 140–400)
RBC: 3.82 10*6/uL (ref 3.80–5.10)
RDW: 14.4 % (ref 11.0–15.0)
Total Lymphocyte: 34.5 %
WBC: 6 10*3/uL (ref 3.8–10.8)

## 2021-02-01 LAB — RPR: RPR Ser Ql: NONREACTIVE

## 2021-02-02 DIAGNOSIS — F640 Transsexualism: Secondary | ICD-10-CM | POA: Diagnosis not present

## 2021-02-09 DIAGNOSIS — F640 Transsexualism: Secondary | ICD-10-CM | POA: Diagnosis not present

## 2021-02-12 ENCOUNTER — Other Ambulatory Visit: Payer: Self-pay

## 2021-02-12 ENCOUNTER — Emergency Department (HOSPITAL_BASED_OUTPATIENT_CLINIC_OR_DEPARTMENT_OTHER)
Admission: EM | Admit: 2021-02-12 | Discharge: 2021-02-12 | Disposition: A | Discharge status: Home / Self Care | Payer: BC Managed Care – PPO | Attending: Emergency Medicine | Admitting: Emergency Medicine

## 2021-02-12 DIAGNOSIS — Z21 Asymptomatic human immunodeficiency virus [HIV] infection status: Secondary | ICD-10-CM | POA: Diagnosis not present

## 2021-02-12 DIAGNOSIS — R7303 Prediabetes: Secondary | ICD-10-CM | POA: Insufficient documentation

## 2021-02-12 DIAGNOSIS — X58XXXA Exposure to other specified factors, initial encounter: Secondary | ICD-10-CM | POA: Insufficient documentation

## 2021-02-12 DIAGNOSIS — S0501XA Injury of conjunctiva and corneal abrasion without foreign body, right eye, initial encounter: Secondary | ICD-10-CM | POA: Diagnosis not present

## 2021-02-12 DIAGNOSIS — S0591XA Unspecified injury of right eye and orbit, initial encounter: Secondary | ICD-10-CM | POA: Diagnosis not present

## 2021-02-12 MED ORDER — FLUORESCEIN SODIUM 1 MG OP STRP
1.0000 | ORAL_STRIP | Freq: Once | OPHTHALMIC | Status: AC
  Administered 2021-02-12: 1 via OPHTHALMIC
  Filled 2021-02-12: qty 1

## 2021-02-12 MED ORDER — TETRACAINE HCL 0.5 % OP SOLN
1.0000 [drp] | Freq: Once | OPHTHALMIC | Status: AC
  Administered 2021-02-12: 1 [drp] via OPHTHALMIC
  Filled 2021-02-12: qty 4

## 2021-02-12 MED ORDER — TOBRAMYCIN 0.3 % OP SOLN
1.0000 [drp] | Freq: Once | OPHTHALMIC | Status: AC
  Administered 2021-02-12: 1 [drp] via OPHTHALMIC
  Filled 2021-02-12: qty 5

## 2021-02-12 MED ORDER — ERYTHROMYCIN 5 MG/GM OP OINT
TOPICAL_OINTMENT | Freq: Once | OPHTHALMIC | Status: AC
  Filled 2021-02-12: qty 3.5

## 2021-02-12 NOTE — ED Triage Notes (Signed)
Saturday blurry vision r/t contact lens worn on Friday - right eye. No problem removing contacts.- constant tearing  Woke up in pain this morning - tried flushing with tears -

## 2021-02-12 NOTE — ED Provider Notes (Signed)
[facility] EMERGENCY DEPARTMENT Provider Note   CSN: [id number] Arrival date & time: 02/12/21  4098     History Chief Complaint  Patient presents with  . Eye Problem    Renee Malone is a 35 y.o. adult.  The history is provided by the patient.  Eye Problem Location:  Right eye Quality:  Pulsating, tearing and sharp Severity:  Severe Onset quality:  Gradual Duration:  2 days Timing:  Constant Progression:  Worsening Chronicity:  New Context: contact lenses   Relieved by:  Nothing Worsened by:  Bright light Ineffective treatments:  None tried Associated symptoms: inflammation, photophobia, redness and tearing   Associated symptoms: no crusting, no double vision and no facial rash   Associated symptoms comment:  Feels like vision from the right eye is a little less sharp but otherwise ok Risk factors comment:  Contact user but no recent stratches or trauma to the eye.  no lid swelling or pain      Past Medical History:  Diagnosis Date  . Anxiety    panic attacks  . Elevated serum creatinine 06/10/2019  . Heart murmur   . HIV infection (HCC)   . Low hemoglobin 05/25/2020  . Prediabetes 05/25/2020    Patient Active Problem List   Diagnosis Date Noted  . Low hemoglobin 05/25/2020  . Prediabetes 05/25/2020  . Elevated serum creatinine 06/10/2019  . Rash and nonspecific skin eruption 11/02/2015  . Allergic rhinitis 01/28/2013  . Precordial chest pain 12/19/2012  . Tendonitis 06/11/2012  . Depression 01/08/2012  . Hormonal imbalance in transgender patient 05/22/2011  . Human immunodeficiency virus (HIV) disease (HCC) 07/21/2009    Past Surgical History:  Procedure Laterality Date  . BREAST ENHANCEMENT SURGERY Bilateral 02/25/2018   Procedure: MAMMOPLASTY AUGMENTATION WITH SILICONE IMPLANTS;  Surgeon: Glenna Fellows, MD;  Location: [facility];  Service: Plastics;  Laterality: Bilateral;  . BREAST LUMPECTOMY Right 02/25/2018    Procedure: EXCISION RIGHT BREAST MASS;  Surgeon: Glenna Fellows, MD;  Location: [facility];  Service: Plastics;  Laterality: Right;  . SKIN GRAFT         Family History  Problem Relation Age of Onset  . Hypertension Maternal Grandmother     Social History   Tobacco Use  . Smoking status: Never Smoker  . Smokeless tobacco: Never Used  Substance Use Topics  . Alcohol use: Yes    Alcohol/week: 1.0 standard drink    Types: 1 Standard drinks or equivalent per week    Comment: rarely  . Drug use: No    Home Medications Prior to Admission medications   Medication Sig Start Date End Date Taking? Authorizing Provider  ALPRAZolam Prudy Feeler) 1 MG tablet TAKE ONE TABLET BY MOUTH EVERY 8 HOURS AS NEEDED Patient not taking: Reported on 06/08/2015 01/10/12   Cliffton Asters, MD  estradiol (CLIMARA - DOSED IN MG/24 HR) 0.0375 mg/24hr patch Place 1 patch (0.0375 mg total) onto the skin once a week. 10/27/20   Romero Belling, MD  hydrocortisone 1 % ointment Apply 1 application topically 2 (two) times daily. 05/27/19   Randall Hiss, MD  ODEFSEY 200-25-25 MG TABS tablet TAKE 1 TABLET BY MOUTH EVERY DAY 01/24/21   Daiva Eves, Lisette Grinder, MD    Allergies    Cy Blamer [place] oleracea]  Review of Systems   Review of Systems  Eyes: Positive for photophobia and redness. Negative for double vision.  All other systems reviewed and are negative.   Physical Exam  Updated Vital Signs BP 117/84 (BP Location: Right Arm)   Pulse 67   Temp 98.8 F (37.1 C) (Oral)   Resp 16   Ht 6\' 3"  (1.905 m)   Wt 98.4 kg   SpO2 100%   BMI 27.12 kg/m   Physical Exam Vitals and nursing note reviewed.  Constitutional:      General: She is not in acute distress.    Appearance: Normal appearance.  HENT:     Head: Normocephalic.     Nose: Nose normal.     Mouth/Throat:     Mouth: Mucous membranes are moist.  Eyes:     General: Lids are normal. Lids are everted, no foreign bodies appreciated.  Vision grossly intact. No visual field deficit.    Extraocular Movements: Extraocular movements intact.     Conjunctiva/sclera:     Right eye: Right conjunctiva is injected. No exudate.    Left eye: Left conjunctiva is not injected. No chemosis or exudate.    Pupils:     Right eye: Corneal abrasion and fluorescein uptake present.      Comments: Constant tearing of the eye.  Pupil is 9mm and sluggishly reactive.  Significant injection of the right eye and photosensitive  Neurological:     Mental Status: She is alert.     ED Results / Procedures / Treatments   Labs (all labs ordered are listed, but only abnormal results are displayed) Labs Reviewed - No data to display  EKG None  Radiology No results found.  Procedures Procedures   Medications Ordered in ED Medications  tetracaine (PONTOCAINE) 0.5 % ophthalmic solution 1 drop (has no administration in time range)  fluorescein ophthalmic strip 1 strip (has no administration in time range)    ED Course  I have reviewed the triage vital signs and the nursing notes.  Pertinent labs & imaging results that were available during my care of the patient were reviewed by me and considered in my medical decision making (see chart for details).    MDM Rules/Calculators/A&P                          Pt is a transgender female with hx of HIV with undetectable viral load and normal CD4 counts as of 2 weeks ago who is a contact lens wearer who removed contacts on Friday and started having some tearing and blurry vision of the right eye on sat.  Overnight developed significant pain in the right eye and redness has worsened.  No fever but noticed slight runny nose since the pain in the eye worsened.  Does not feel that there has been significant change in vision.  Fluorescein stain shows large corneal abrasion covering half of the pupil and majority of the iris.  Pt started on tobramycin drops and erythromycin ointment at night.  She will wear  an eyepatch at night because she states her eyes do not always close completely.  Pt needs f/u with ophtho tomorrow.  Cautioned no contacts. Final Clinical Impression(s) / ED Diagnoses Final diagnoses:  Abrasion of right cornea, initial encounter    Rx / DC Orders ED Discharge Orders    None       Friday, MD 02/12/21 [phone]

## 2021-02-12 NOTE — Discharge Instructions (Signed)
Use the ointment at night before bed.  Use the drops every 6 hours while awake.   Use the pain drops just today and tomorrow only 2-3 times a day.  Need to follow up with your eye doctor or the eye doctor information was provided for tomorrow.  Do not wear contacts until cleared by doctor

## 2021-02-16 ENCOUNTER — Telehealth (INDEPENDENT_AMBULATORY_CARE_PROVIDER_SITE_OTHER): Payer: BC Managed Care – PPO | Admitting: Infectious Disease

## 2021-02-16 ENCOUNTER — Encounter: Payer: Self-pay | Admitting: Infectious Disease

## 2021-02-16 ENCOUNTER — Other Ambulatory Visit: Payer: Self-pay

## 2021-02-16 DIAGNOSIS — M779 Enthesopathy, unspecified: Secondary | ICD-10-CM

## 2021-02-16 DIAGNOSIS — B20 Human immunodeficiency virus [HIV] disease: Secondary | ICD-10-CM | POA: Diagnosis not present

## 2021-02-16 DIAGNOSIS — D649 Anemia, unspecified: Secondary | ICD-10-CM

## 2021-02-16 NOTE — Progress Notes (Signed)
Virtual Visit via Telephone Note  I connected with Renee Malone on 02/16/21 at  4:15 PM EDT by telephone and verified that I am speaking with the correct person using two identifiers.  Location: Patient: Optometry office when I called Provider: [facility]   I discussed the limitations, risks, security and privacy concerns of performing an evaluation and management service by telephone and the availability of in person appointments. I also discussed with the patient that there may be a patient responsible charge related to this service. The patient expressed understanding and agreed to proceed.   History of Present Illness:  Renee Malone 35 year old African-American transgender woman living with HIV that is been perfectly suppressed on Odefsey.    She has undergone gender affirming surgery at [facility].   Observations/Objective   Renee Malone appeared to be doing well and had reviewed her labs on MyChart and asked if I thought anything was abnormal.    Assessment and Plan: HIV disease: continue Odefsey, RTC in 6 months time and I have made appt for her  Transgender female: has had gender affirming surgery and is seeing Dr. Everardo Malone with Endocrinology    Follow Up Instructions:    I discussed the assessment and treatment plan with the patient. The patient was provided an opportunity to ask questions and Malone were answered. The patient agreed with the plan and demonstrated an understanding of the instructions.   The patient was advised to call back or seek an in-person evaluation if the symptoms worsen or if the condition fails to improve as anticipated.  I provided  8  minutes of non-face-to-face time during this encounter.   Acey Lav, MD

## 2021-02-23 ENCOUNTER — Other Ambulatory Visit: Payer: Self-pay

## 2021-02-23 DIAGNOSIS — N895 Stricture and atresia of vagina: Secondary | ICD-10-CM | POA: Diagnosis not present

## 2021-02-23 DIAGNOSIS — F640 Transsexualism: Secondary | ICD-10-CM | POA: Diagnosis not present

## 2021-02-23 DIAGNOSIS — B20 Human immunodeficiency virus [HIV] disease: Secondary | ICD-10-CM

## 2021-02-23 DIAGNOSIS — N9489 Other specified conditions associated with female genital organs and menstrual cycle: Secondary | ICD-10-CM | POA: Diagnosis not present

## 2021-02-23 MED ORDER — ODEFSEY 200-25-25 MG PO TABS: 1.0000 | ORAL_TABLET | Freq: Every day | ORAL | 5 refills | Status: DC

## 2021-04-03 DIAGNOSIS — F640 Transsexualism: Secondary | ICD-10-CM | POA: Diagnosis not present

## 2021-04-20 DIAGNOSIS — F640 Transsexualism: Secondary | ICD-10-CM | POA: Diagnosis not present

## 2021-04-20 DIAGNOSIS — N9489 Other specified conditions associated with female genital organs and menstrual cycle: Secondary | ICD-10-CM | POA: Diagnosis not present

## 2021-04-20 DIAGNOSIS — N895 Stricture and atresia of vagina: Secondary | ICD-10-CM | POA: Diagnosis not present

## 2021-04-25 DIAGNOSIS — N895 Stricture and atresia of vagina: Secondary | ICD-10-CM | POA: Diagnosis not present

## 2021-04-25 DIAGNOSIS — N9489 Other specified conditions associated with female genital organs and menstrual cycle: Secondary | ICD-10-CM | POA: Diagnosis not present

## 2021-04-25 DIAGNOSIS — F640 Transsexualism: Secondary | ICD-10-CM | POA: Diagnosis not present

## 2021-04-27 ENCOUNTER — Ambulatory Visit: Payer: BC Managed Care – PPO | Admitting: Endocrinology

## 2021-04-28 ENCOUNTER — Ambulatory Visit: Payer: BC Managed Care – PPO

## 2021-04-28 ENCOUNTER — Other Ambulatory Visit: Payer: Self-pay

## 2021-04-28 ENCOUNTER — Encounter: Payer: Self-pay | Admitting: Infectious Disease

## 2021-05-02 ENCOUNTER — Ambulatory Visit (INDEPENDENT_AMBULATORY_CARE_PROVIDER_SITE_OTHER): Payer: BC Managed Care – PPO | Admitting: Endocrinology

## 2021-05-02 ENCOUNTER — Other Ambulatory Visit: Payer: Self-pay

## 2021-05-02 DIAGNOSIS — F640 Transsexualism: Secondary | ICD-10-CM

## 2021-05-02 DIAGNOSIS — R739 Hyperglycemia, unspecified: Secondary | ICD-10-CM

## 2021-05-02 DIAGNOSIS — Z789 Other specified health status: Secondary | ICD-10-CM

## 2021-05-02 NOTE — Patient Instructions (Addendum)
Blood tests are requested for you today.  We'll let you know about the results.  Please come back for a follow-up appointment in 6 months.   

## 2021-05-02 NOTE — Progress Notes (Signed)
Subjective:    Patient ID: Renee Malone, adult    DOB: [birth year], 35 y.o.   MRN: [id number]  HPI Pt returns for f/u of transgender state (M to F): Surgery: breast implants in 2019 ([place]), and vaginoplasty was in 2021 [facility]).  Medication: E2 since 2011 Counseling: last went in 2021 Other: She stopped aldactone and Lupron in 2021. Interval hx: Pt says she did not have E2 patch on when she had 2/22 labs.  She has the patch on now.  Since on the patch, she feels no different.   Past Medical History:  Diagnosis Date   Anxiety    panic attacks   Elevated serum creatinine 06/10/2019   Heart murmur    HIV infection (HCC)    Low hemoglobin 05/25/2020   Prediabetes 05/25/2020    Past Surgical History:  Procedure Laterality Date   BREAST ENHANCEMENT SURGERY Bilateral 02/25/2018   Procedure: MAMMOPLASTY AUGMENTATION WITH SILICONE IMPLANTS;  Surgeon: Glenna Fellows, MD;  Location: [facility];  Service: Plastics;  Laterality: Bilateral;   BREAST LUMPECTOMY Right 02/25/2018   Procedure: EXCISION RIGHT BREAST MASS;  Surgeon: Glenna Fellows, MD;  Location: [facility];  Service: Plastics;  Laterality: Right;   SKIN GRAFT      Social History   Socioeconomic History   Marital status: Married    Spouse name: Not on file   Number of children: Not on file   Years of education: Not on file   Highest education level: Not on file  Occupational History   Not on file  Tobacco Use   Smoking status: Never   Smokeless tobacco: Never  Substance and Sexual Activity   Alcohol use: Yes    Alcohol/week: 1.0 standard drink    Types: 1 Standard drinks or equivalent per week    Comment: rarely   Drug use: No   Sexual activity: Not Currently    Partners: Male  Other Topics Concern   Not on file  Social History Narrative   Lives alone.    Social Determinants of Health   Financial Resource Strain: Not on file  Food Insecurity: Not on file  Transportation  Needs: Not on file  Physical Activity: Not on file  Stress: Not on file  Social Connections: Not on file  Intimate Partner Violence: Not on file    Current Outpatient Medications on File Prior to Visit  Medication Sig Dispense Refill   emtricitabine-rilpivir-tenofovir AF (ODEFSEY) 200-25-25 MG TABS tablet Take 1 tablet by mouth daily. 30 tablet 5   estradiol (CLIMARA - DOSED IN MG/24 HR) 0.0375 mg/24hr patch Place 1 patch (0.0375 mg total) onto the skin once a week. 4 patch 12   hydrocortisone 1 % ointment Apply 1 application topically 2 (two) times daily. 30 g 2   [CANCELED] ALPRAZolam (XANAX) 1 MG tablet TAKE ONE TABLET BY MOUTH EVERY 8 HOURS AS NEEDED (Patient not taking: Reported on 06/08/2015) 60 tablet 2   No current facility-administered medications on file prior to visit.    Allergies  Allergen Reactions   Broccoli [Brassica Oleracea] Anaphylaxis    Family History  Problem Relation Age of Onset   Hypertension Maternal Grandmother     BP 130/80 (BP Location: Right Arm, Patient Position: Sitting, Cuff Size: Normal)   Pulse 67   Ht 6\' 3"  (1.905 m)   Wt 231 lb 6.4 oz (105 kg)   SpO2 97%   BMI 28.92 kg/m    Review of Systems Denies sob.  She has gained 30 lbs.     Objective:   Physical Exam VITAL SIGNS:  See vs page GENERAL: no distress Ext: no leg edema      Assessment & Plan:  Transgender state: uncontrolled.  Continue E2 patch Weight gain: check TFT  Patient Instructions  Blood tests are requested for you today.  We'll let you know about the results.   Please come back for a follow-up appointment in 6 months.

## 2021-05-04 DIAGNOSIS — N9489 Other specified conditions associated with female genital organs and menstrual cycle: Secondary | ICD-10-CM | POA: Diagnosis not present

## 2021-05-04 DIAGNOSIS — N895 Stricture and atresia of vagina: Secondary | ICD-10-CM | POA: Diagnosis not present

## 2021-05-04 DIAGNOSIS — F640 Transsexualism: Secondary | ICD-10-CM | POA: Diagnosis not present

## 2021-05-04 LAB — TESTOSTERONE,FREE AND TOTAL
Testosterone, Free: 0.2 pg/mL — ABNORMAL LOW (ref 8.7–25.1)
Testosterone: <3 ng/dL — ABNORMAL LOW (ref 264–916)

## 2021-05-07 LAB — ESTRADIOL, FREE
Estradiol, Free: 0.37 pg/mL
Estradiol: 16 pg/mL (ref <=29)

## 2021-05-11 DIAGNOSIS — N9489 Other specified conditions associated with female genital organs and menstrual cycle: Secondary | ICD-10-CM | POA: Diagnosis not present

## 2021-05-11 DIAGNOSIS — F640 Transsexualism: Secondary | ICD-10-CM | POA: Diagnosis not present

## 2021-05-11 DIAGNOSIS — N895 Stricture and atresia of vagina: Secondary | ICD-10-CM | POA: Diagnosis not present

## 2021-05-19 DIAGNOSIS — Z87891 Personal history of nicotine dependence: Secondary | ICD-10-CM | POA: Diagnosis not present

## 2021-05-19 DIAGNOSIS — F640 Transsexualism: Secondary | ICD-10-CM | POA: Diagnosis not present

## 2021-05-19 DIAGNOSIS — N895 Stricture and atresia of vagina: Secondary | ICD-10-CM | POA: Diagnosis not present

## 2021-05-23 ENCOUNTER — Other Ambulatory Visit: Payer: Self-pay

## 2021-06-08 DIAGNOSIS — N895 Stricture and atresia of vagina: Secondary | ICD-10-CM | POA: Diagnosis not present

## 2021-06-08 DIAGNOSIS — F640 Transsexualism: Secondary | ICD-10-CM | POA: Diagnosis not present

## 2021-06-08 DIAGNOSIS — N9489 Other specified conditions associated with female genital organs and menstrual cycle: Secondary | ICD-10-CM | POA: Diagnosis not present

## 2021-06-14 DIAGNOSIS — F4323 Adjustment disorder with mixed anxiety and depressed mood: Secondary | ICD-10-CM | POA: Diagnosis not present

## 2021-06-14 DIAGNOSIS — Z630 Problems in relationship with spouse or partner: Secondary | ICD-10-CM | POA: Diagnosis not present

## 2021-06-15 DIAGNOSIS — F4323 Adjustment disorder with mixed anxiety and depressed mood: Secondary | ICD-10-CM | POA: Diagnosis not present

## 2021-06-20 DIAGNOSIS — F640 Transsexualism: Secondary | ICD-10-CM | POA: Diagnosis not present

## 2021-06-20 DIAGNOSIS — N9489 Other specified conditions associated with female genital organs and menstrual cycle: Secondary | ICD-10-CM | POA: Diagnosis not present

## 2021-06-20 DIAGNOSIS — N895 Stricture and atresia of vagina: Secondary | ICD-10-CM | POA: Diagnosis not present

## 2021-06-27 DIAGNOSIS — N9489 Other specified conditions associated with female genital organs and menstrual cycle: Secondary | ICD-10-CM | POA: Diagnosis not present

## 2021-06-27 DIAGNOSIS — N895 Stricture and atresia of vagina: Secondary | ICD-10-CM | POA: Diagnosis not present

## 2021-06-27 DIAGNOSIS — F640 Transsexualism: Secondary | ICD-10-CM | POA: Diagnosis not present

## 2021-06-28 DIAGNOSIS — Z09 Encounter for follow-up examination after completed treatment for conditions other than malignant neoplasm: Secondary | ICD-10-CM | POA: Diagnosis not present

## 2021-06-28 DIAGNOSIS — F640 Transsexualism: Secondary | ICD-10-CM | POA: Diagnosis not present

## 2021-07-06 DIAGNOSIS — F640 Transsexualism: Secondary | ICD-10-CM | POA: Diagnosis not present

## 2021-07-06 DIAGNOSIS — N895 Stricture and atresia of vagina: Secondary | ICD-10-CM | POA: Diagnosis not present

## 2021-07-06 DIAGNOSIS — N9489 Other specified conditions associated with female genital organs and menstrual cycle: Secondary | ICD-10-CM | POA: Diagnosis not present

## 2021-07-13 DIAGNOSIS — N895 Stricture and atresia of vagina: Secondary | ICD-10-CM | POA: Diagnosis not present

## 2021-07-13 DIAGNOSIS — N9489 Other specified conditions associated with female genital organs and menstrual cycle: Secondary | ICD-10-CM | POA: Diagnosis not present

## 2021-07-20 DIAGNOSIS — N895 Stricture and atresia of vagina: Secondary | ICD-10-CM | POA: Diagnosis not present

## 2021-07-20 DIAGNOSIS — N9489 Other specified conditions associated with female genital organs and menstrual cycle: Secondary | ICD-10-CM | POA: Diagnosis not present

## 2021-08-23 ENCOUNTER — Other Ambulatory Visit: Payer: Self-pay

## 2021-08-23 ENCOUNTER — Ambulatory Visit: Payer: BC Managed Care – PPO | Admitting: Infectious Disease

## 2021-08-23 DIAGNOSIS — B20 Human immunodeficiency virus [HIV] disease: Secondary | ICD-10-CM

## 2021-08-23 MED ORDER — ODEFSEY 200-25-25 MG PO TABS: 1.0000 | ORAL_TABLET | Freq: Every day | ORAL | 3 refills | Status: DC

## 2021-12-27 ENCOUNTER — Other Ambulatory Visit: Payer: Self-pay

## 2021-12-27 DIAGNOSIS — B20 Human immunodeficiency virus [HIV] disease: Secondary | ICD-10-CM

## 2021-12-27 MED ORDER — ODEFSEY 200-25-25 MG PO TABS: 1.0000 | ORAL_TABLET | Freq: Every day | ORAL | 0 refills | Status: DC

## 2021-12-29 ENCOUNTER — Other Ambulatory Visit: Payer: Self-pay

## 2021-12-29 DIAGNOSIS — Z113 Encounter for screening for infections with a predominantly sexual mode of transmission: Secondary | ICD-10-CM

## 2021-12-29 DIAGNOSIS — Z79899 Other long term (current) drug therapy: Secondary | ICD-10-CM

## 2021-12-29 DIAGNOSIS — B20 Human immunodeficiency virus [HIV] disease: Secondary | ICD-10-CM

## 2022-01-01 ENCOUNTER — Other Ambulatory Visit: Payer: Self-pay

## 2022-01-01 ENCOUNTER — Other Ambulatory Visit: Payer: BC Managed Care – PPO

## 2022-01-01 ENCOUNTER — Other Ambulatory Visit (HOSPITAL_COMMUNITY)
Admission: RE | Admit: 2022-01-01 | Discharge: 2022-01-01 | Disposition: A | Discharge status: Home / Self Care | Payer: BC Managed Care – PPO | Source: Ambulatory Visit | Attending: Infectious Disease | Admitting: Infectious Disease

## 2022-01-01 DIAGNOSIS — B20 Human immunodeficiency virus [HIV] disease: Secondary | ICD-10-CM

## 2022-01-01 DIAGNOSIS — Z113 Encounter for screening for infections with a predominantly sexual mode of transmission: Secondary | ICD-10-CM

## 2022-01-02 ENCOUNTER — Encounter: Payer: Self-pay | Admitting: Endocrinology

## 2022-01-02 LAB — HEMOGLOBIN A1C
Hgb A1c MFr Bld: 6.1 % of total Hgb — ABNORMAL HIGH (ref <5.7)
Mean Plasma Glucose: 128 mg/dL
eAG (mmol/L): 7.1 mmol/L

## 2022-01-02 LAB — URINE CYTOLOGY ANCILLARY ONLY
Chlamydia: NEGATIVE
Comment: NEGATIVE
Comment: NORMAL
Neisseria Gonorrhea: NEGATIVE

## 2022-01-02 LAB — T-HELPER CELL (CD4) - (RCID CLINIC ONLY)
CD4 % Helper T Cell: 48 % (ref 33–65)
CD4 T Cell Abs: 1046 /uL (ref 400–1790)

## 2022-01-03 LAB — COMPLETE METABOLIC PANEL WITH GFR
AG Ratio: 1.5 (calc) (ref 1.0–2.5)
ALT: 19 U/L (ref 6–29)
AST: 17 U/L (ref 10–30)
Albumin: 4.6 g/dL (ref 3.6–5.1)
Alkaline phosphatase (APISO): 57 U/L (ref 31–125)
BUN/Creatinine Ratio: 14 (calc) (ref 6–22)
BUN: 15 mg/dL (ref 7–25)
CO2: 27 mmol/L (ref 20–32)
Calcium: 9.6 mg/dL (ref 8.6–10.2)
Chloride: 104 mmol/L (ref 98–110)
Creat: 1.09 mg/dL — ABNORMAL HIGH (ref 0.50–0.97)
Globulin: 3 g/dL (calc) (ref 1.9–3.7)
Glucose, Bld: 92 mg/dL (ref 65–99)
Potassium: 4.1 mmol/L (ref 3.5–5.3)
Sodium: 139 mmol/L (ref 135–146)
Total Bilirubin: 0.3 mg/dL (ref 0.2–1.2)
Total Protein: 7.6 g/dL (ref 6.1–8.1)
eGFR: 67 mL/min/{1.73_m2} (ref >=60)

## 2022-01-03 LAB — CBC WITH DIFFERENTIAL/PLATELET
Absolute Monocytes: 549 cells/uL (ref 200–950)
Basophils Absolute: 40 cells/uL (ref 0–200)
Basophils Relative: 0.6 %
Eosinophils Absolute: 141 cells/uL (ref 15–500)
Eosinophils Relative: 2.1 %
HCT: 40.1 % (ref 35.0–45.0)
Hemoglobin: 13.1 g/dL (ref 11.7–15.5)
Lymphs Abs: 2245 cells/uL (ref 850–3900)
MCH: 30.3 pg (ref 27.0–33.0)
MCHC: 32.7 g/dL (ref 32.0–36.0)
MCV: 92.8 fL (ref 80.0–100.0)
MPV: 10.9 fL (ref 7.5–12.5)
Monocytes Relative: 8.2 %
Neutro Abs: 3725 cells/uL (ref 1500–7800)
Neutrophils Relative %: 55.6 %
Platelets: 269 10*3/uL (ref 140–400)
RBC: 4.32 10*6/uL (ref 3.80–5.10)
RDW: 12.8 % (ref 11.0–15.0)
Total Lymphocyte: 33.5 %
WBC: 6.7 10*3/uL (ref 3.8–10.8)

## 2022-01-03 LAB — RPR: RPR Ser Ql: NONREACTIVE

## 2022-01-03 LAB — HIV-1 RNA QUANT-NO REFLEX-BLD
HIV 1 RNA Quant: <20 Copies/mL — ABNORMAL HIGH
HIV-1 RNA Quant, Log: <1.3 Log cps/mL — ABNORMAL HIGH

## 2022-01-05 ENCOUNTER — Encounter: Payer: Self-pay | Admitting: Infectious Disease

## 2022-01-16 ENCOUNTER — Other Ambulatory Visit: Payer: Self-pay

## 2022-01-16 ENCOUNTER — Ambulatory Visit (INDEPENDENT_AMBULATORY_CARE_PROVIDER_SITE_OTHER): Payer: BC Managed Care – PPO | Admitting: Infectious Disease

## 2022-01-16 ENCOUNTER — Encounter: Payer: Self-pay | Admitting: Infectious Disease

## 2022-01-16 DIAGNOSIS — Z789 Other specified health status: Secondary | ICD-10-CM | POA: Diagnosis not present

## 2022-01-16 DIAGNOSIS — B20 Human immunodeficiency virus [HIV] disease: Secondary | ICD-10-CM

## 2022-01-16 DIAGNOSIS — R7303 Prediabetes: Secondary | ICD-10-CM | POA: Diagnosis not present

## 2022-01-16 MED ORDER — BIKTARVY 50-200-25 MG PO TABS: 1.0000 | ORAL_TABLET | Freq: Every day | ORAL | 11 refills | Status: DC

## 2022-01-16 NOTE — Progress Notes (Signed)
Virtual Visit via Telephone Note ? ?I connected with Renee Malone on 01/16/22 at 11:15 AM EDT by telephone and verified that I am speaking with the correct person using two identifiers. ? ?Location: ?Patient: Home ?Provider: [facility] ?  ?I discussed the limitations, risks, security and privacy concerns of performing an evaluation and management service by telephone and the availability of in person appointments. I also discussed with the patient that there may be a patient responsible charge related to this service. The patient expressed understanding and agreed to proceed. ? ? ?History of Present Illness: ? ? ?Renee Malone is a 36 year old Transgender woman living with HIV has been perfectly controlled over the past 13+ years. ? ?She was initially on Atripla later moved over to [facility] which she is currently taking. ? ?She was interested in switching over to [place]. ? ?The reasons that she would like to change over the [place] are that she is working 3 jobs and at times if she comes home late and is tired may forget to take her Odefsey. ? ?She says this happens roughly 3-4 times a month at most. ? ?When going over how to take [facility] she did endorse that sometimes she neglects to take it with food. ? ?I went over the pluses and minuses of currently available long-acting Cabenuva including the less than a percent virological failure despite on-time injections this latter risk was 1 that she did not want to undertake. ? ?I did suggest that she could move to oral drug with a higher barrier to resistance such as Dovato or Biktarvy (or SYMTUZA though I did not want to introduce new drug drug interactions with cobicistat) ? ?Renee Malone is prediabetic and has concerns about her A1c being 6.1. ? ?I did counsel her about the risk of weight gain with new or integrase strand transfer inhibitors in particular transfer positioning such individual some NNRTI to integrase based therapies. ? ?She is also wanting to change her  estrogen delivery mechanism from the patch back to either injections or pills. ? ?Review of system above as above otherwise 12 point view of systems negative ? ?Past Medical History:  ?Diagnosis Date  ? Anxiety   ? panic attacks  ? Elevated serum creatinine 06/10/2019  ? Heart murmur   ? HIV infection (HCC)   ? Low hemoglobin 05/25/2020  ? Prediabetes 05/25/2020  ? ? ?Past Surgical History:  ?Procedure Laterality Date  ? BREAST ENHANCEMENT SURGERY Bilateral 02/25/2018  ? Procedure: MAMMOPLASTY AUGMENTATION WITH SILICONE IMPLANTS;  Surgeon: Glenna Fellows, MD;  Location: [facility];  Service: Plastics;  Laterality: Bilateral;  ? BREAST LUMPECTOMY Right 02/25/2018  ? Procedure: EXCISION RIGHT BREAST MASS;  Surgeon: Glenna Fellows, MD;  Location: [facility];  Service: Plastics;  Laterality: Right;  ? SKIN GRAFT    ? ? ?Family History  ?Problem Relation Age of Onset  ? Hypertension Maternal Grandmother   ? ? ?  ?Social History  ? ?Socioeconomic History  ? Marital status: Married  ?  Spouse name: Not on file  ? Number of children: Not on file  ? Years of education: Not on file  ? Highest education level: Not on file  ?Occupational History  ? Not on file  ?Tobacco Use  ? Smoking status: Never  ? Smokeless tobacco: Never  ?Substance and Sexual Activity  ? Alcohol use: Yes  ?  Alcohol/week: 1.0 standard drink  ?  Types: 1 Standard drinks or equivalent per week  ?  Comment:  rarely  ? Drug use: No  ? Sexual activity: Not Currently  ?  Partners: Male  ?Other Topics Concern  ? Not on file  ?Social History Narrative  ? Lives alone.   ? ?Social Determinants of Health  ? ?Financial Resource Strain: Not on file  ?Food Insecurity: Not on file  ?Transportation Needs: Not on file  ?Physical Activity: Not on file  ?Stress: Not on file  ?Social Connections: Not on file  ? ? ?Allergies  ?Allergen Reactions  ? Broccoli [Brassica Oleracea] Anaphylaxis  ? ? ? ?Current Outpatient Medications:  ?   bictegravir-emtricitabine-tenofovir AF (BIKTARVY) 50-200-25 MG TABS tablet, Take 1 tablet by mouth daily., Disp: 30 tablet, Rfl: 11 ?  estradiol (CLIMARA - DOSED IN MG/24 HR) 0.0375 mg/24hr patch, Place 1 patch (0.0375 mg total) onto the skin once a week., Disp: 4 patch, Rfl: 12 ?  hydrocortisone 1 % ointment, Apply 1 application topically 2 (two) times daily., Disp: 30 g, Rfl: 2 ? ?  ?Observations/Objective: ? ?Renee Malone appeared to be in good spirits on the phone and we had a thorough discussion together ? ?Assessment and Plan: ? ?HIV disease:  ? ?We will switch her to [facility] to give her a drug with a higher barrier to resistance and 1 that will not require her to have food.  She preferred to go to [facility] since it is got 3 medicines in it and 2 of them are in her Charlett Lango already. ? ?Dovato certainly would have also been acceptable and she does not have hepatitis B coinfection. ? ?We will bring her back in a month time for a visit with me and we will check a viral load at that time per protocol. ? ?BIKTARVY should suit her just fine and should help with any concerns about needing to take her medication with food because it can be taken with or without food. ? ?It also has a much higher.  Resistance in her current regimen will be more forgiving of missed doses than her current regimen is. ? ?Transgender health: She has been seeing Dr. Everardo All who is retiring so I am going to reach out to Dr. Elvera Lennox to see if she would be willing to take on Renee Malone ? ?Pre-diabetes: weight loss would be key in my book. She is actually happy with her apperance with this weight but it is putting her on verge of DM. We will have to also be careful with switch from NNRTI to INSTI regimen ? ? ? ?Follow Up Instructions: ? ?  ?I discussed the assessment and treatment plan with the patient. The patient was provided an opportunity to ask questions and all were answered. The patient agreed with the plan and demonstrated an understanding of  the instructions. ?  ?The patient was advised to call back or seek an in-person evaluation if the symptoms worsen or if the condition fails to improve as anticipated. ? ?I provided 28  minutes of non-face-to-face time during this encounter. ? ? ?Acey Lav, MD ? ?

## 2022-01-21 ENCOUNTER — Emergency Department (HOSPITAL_COMMUNITY): Payer: BC Managed Care – PPO

## 2022-01-21 ENCOUNTER — Emergency Department (HOSPITAL_COMMUNITY)
Admission: EM | Admit: 2022-01-21 | Discharge: 2022-01-21 | Disposition: A | Discharge status: Home / Self Care | Payer: BC Managed Care – PPO | Attending: Emergency Medicine | Admitting: Emergency Medicine

## 2022-01-21 ENCOUNTER — Encounter (HOSPITAL_COMMUNITY): Payer: Self-pay

## 2022-01-21 DIAGNOSIS — Z21 Asymptomatic human immunodeficiency virus [HIV] infection status: Secondary | ICD-10-CM | POA: Insufficient documentation

## 2022-01-21 DIAGNOSIS — R7989 Other specified abnormal findings of blood chemistry: Secondary | ICD-10-CM | POA: Diagnosis not present

## 2022-01-21 DIAGNOSIS — T65891A Toxic effect of other specified substances, accidental (unintentional), initial encounter: Secondary | ICD-10-CM | POA: Insufficient documentation

## 2022-01-21 DIAGNOSIS — J698 Pneumonitis due to inhalation of other solids and liquids: Secondary | ICD-10-CM | POA: Diagnosis not present

## 2022-01-21 DIAGNOSIS — R7303 Prediabetes: Secondary | ICD-10-CM | POA: Insufficient documentation

## 2022-01-21 DIAGNOSIS — R4182 Altered mental status, unspecified: Secondary | ICD-10-CM | POA: Insufficient documentation

## 2022-01-21 DIAGNOSIS — J690 Pneumonitis due to inhalation of food and vomit: Secondary | ICD-10-CM | POA: Insufficient documentation

## 2022-01-21 DIAGNOSIS — T40715A Adverse effect of cannabis, initial encounter: Secondary | ICD-10-CM | POA: Insufficient documentation

## 2022-01-21 DIAGNOSIS — Y900 Blood alcohol level of less than 20 mg/100 ml: Secondary | ICD-10-CM | POA: Insufficient documentation

## 2022-01-21 LAB — CBC WITH DIFFERENTIAL/PLATELET
Abs Immature Granulocytes: 0.02 10*3/uL (ref 0.00–0.07)
Basophils Absolute: 0.1 10*3/uL (ref 0.0–0.1)
Basophils Relative: 1 %
Eosinophils Absolute: 0 10*3/uL (ref 0.0–0.5)
Eosinophils Relative: 0 %
HCT: 37.1 % (ref 36.0–46.0)
Hemoglobin: 12.1 g/dL (ref 12.0–15.0)
Immature Granulocytes: 0 %
Lymphocytes Relative: 13 %
Lymphs Abs: 1.4 10*3/uL (ref 0.7–4.0)
MCH: 30.9 pg (ref 26.0–34.0)
MCHC: 32.6 g/dL (ref 30.0–36.0)
MCV: 94.9 fL (ref 80.0–100.0)
Monocytes Absolute: 0.6 10*3/uL (ref 0.1–1.0)
Monocytes Relative: 6 %
Neutro Abs: 8.1 10*3/uL — ABNORMAL HIGH (ref 1.7–7.7)
Neutrophils Relative %: 80 %
Platelets: 243 10*3/uL (ref 150–400)
RBC: 3.91 MIL/uL (ref 3.87–5.11)
RDW: 12.6 % (ref 11.5–15.5)
WBC: 10.2 10*3/uL (ref 4.0–10.5)
nRBC: 0 % (ref 0.0–0.2)

## 2022-01-21 LAB — RAPID URINE DRUG SCREEN, HOSP PERFORMED
Amphetamines: NOT DETECTED
Barbiturates: NOT DETECTED
Benzodiazepines: NOT DETECTED
Cocaine: NOT DETECTED
Opiates: NOT DETECTED
Tetrahydrocannabinol: POSITIVE — AB

## 2022-01-21 LAB — URINALYSIS, ROUTINE W REFLEX MICROSCOPIC
Bilirubin Urine: NEGATIVE
Glucose, UA: NEGATIVE mg/dL
Hgb urine dipstick: NEGATIVE
Ketones, ur: NEGATIVE mg/dL
Nitrite: POSITIVE — AB
Protein, ur: NEGATIVE mg/dL
Specific Gravity, Urine: 1.025 (ref 1.005–1.030)
pH: 5 (ref 5.0–8.0)

## 2022-01-21 LAB — COMPREHENSIVE METABOLIC PANEL
ALT: 15 U/L (ref 0–44)
AST: 19 U/L (ref 15–41)
Albumin: 3.8 g/dL (ref 3.5–5.0)
Alkaline Phosphatase: 39 U/L (ref 38–126)
Anion gap: 7 (ref 5–15)
BUN: 17 mg/dL (ref 6–20)
CO2: 24 mmol/L (ref 22–32)
Calcium: 8.8 mg/dL — ABNORMAL LOW (ref 8.9–10.3)
Chloride: 105 mmol/L (ref 98–111)
Creatinine, Ser: 1.24 mg/dL — ABNORMAL HIGH (ref 0.44–1.00)
GFR, Estimated: 57 mL/min — ABNORMAL LOW (ref >60)
Glucose, Bld: 137 mg/dL — ABNORMAL HIGH (ref 70–99)
Potassium: 3.9 mmol/L (ref 3.5–5.1)
Sodium: 136 mmol/L (ref 135–145)
Total Bilirubin: 0.3 mg/dL (ref 0.3–1.2)
Total Protein: 6.6 g/dL (ref 6.5–8.1)

## 2022-01-21 LAB — ETHANOL: Alcohol, Ethyl (B): <10 mg/dL (ref <10)

## 2022-01-21 LAB — SALICYLATE LEVEL: Salicylate Lvl: <7 mg/dL — ABNORMAL LOW (ref 7.0–30.0)

## 2022-01-21 LAB — ACETAMINOPHEN LEVEL: Acetaminophen (Tylenol), Serum: <10 ug/mL — ABNORMAL LOW (ref 10–30)

## 2022-01-21 MED ORDER — SODIUM CHLORIDE 0.9 % IV BOLUS
1000.0000 mL | Freq: Once | INTRAVENOUS | Status: AC
  Administered 2022-01-21: 1000 mL via INTRAVENOUS

## 2022-01-21 MED ORDER — SODIUM CHLORIDE 0.9 % IV SOLN
3.0000 g | Freq: Once | INTRAVENOUS | Status: AC
  Administered 2022-01-21: 3 g via INTRAVENOUS
  Filled 2022-01-21: qty 8

## 2022-01-21 MED ORDER — AMOXICILLIN-POT CLAVULANATE 875-125 MG PO TABS: 1.0000 | ORAL_TABLET | Freq: Two times a day (BID) | ORAL | 0 refills | Status: AC

## 2022-01-21 NOTE — Discharge Instructions (Addendum)
Please do not use any more CBD Gummies. ? ?You vomited and may have aspirated some of that vomit which could lead to some irritation of your lungs or pneumonia.  Please take Augmentin twice daily for the next 5 days to try and prevent this.  If you develop chest pain, shortness of breath, cough or fever return for reevaluation. ?

## 2022-01-21 NOTE — ED Provider Notes (Signed)
?[facility] EMERGENCY DEPARTMENT ?Provider Note ? ? ?CSN: [id number] ?Arrival date & time: 01/21/22  0019 ? ?  ? ?History ? ?Chief Complaint  ?Patient presents with  ? Drug Overdose  ? ? ?Renee Malone is a 36 y.o. adult. ? ?Renee Malone is a 36 y.o. transgender female, with a history of HIV, on her lumbar and prediabetes, who presents to the emergency department via EMS for altered mental status after reportedly ingesting a CBD gummy.  The majority of the history is provided by EMS, who reports that they were called out by the patient's neighbors after she ran out of the house yelling that she could not breathe.  On EMS arrival patient collapsed on the ground and was initially not responding but had normal vitals, she had vomited and had emesis present in her airway which was suctioned.  EMS reports that she had intermittent shaking and bystanders question whether she had potentially had a seizure prior to EMS arrival.  EMS reports that she has had intermittent.'s where she is more lucid and is able to answer questions, but then will return to nonsensical speech and sounds.  Patient has intermittently been dancing and reports that she is hearing music in her head..  Patient initially was unable to tell EMS exactly what she took, but later reported that she took a CBD gummy for the first time that she thinks was about 250 mg.  She denies use of other substances. ? ?The history is provided by the patient and the EMS personnel.  ? ?  ? ?Home Medications ?Prior to Admission medications   ?Medication Sig Start Date End Date Taking? Authorizing Provider  ?amoxicillin-clavulanate (AUGMENTIN) 875-125 MG tablet Take 1 tablet by mouth 2 (two) times daily for 5 days. One po bid x 7 days 01/21/22 01/26/22 Yes Dartha Lodge, PA-C  ?ALPRAZolam (XANAX) 1 MG tablet TAKE ONE TABLET BY MOUTH EVERY 8 HOURS AS NEEDED ?Patient not taking: Reported on 06/08/2015 01/10/12   Cliffton Asters, MD   ?bictegravir-emtricitabine-tenofovir AF (BIKTARVY) 50-200-25 MG TABS tablet Take 1 tablet by mouth daily. 01/16/22   Randall Hiss, MD  ?estradiol (CLIMARA - DOSED IN MG/24 HR) 0.0375 mg/24hr patch Place 1 patch (0.0375 mg total) onto the skin once a week. 10/27/20   Romero Belling, MD  ?hydrocortisone 1 % ointment Apply 1 application topically 2 (two) times daily. 05/27/19   Randall Hiss, MD  ?   ? ?Allergies    ?Broccoli [brassica oleracea]   ? ?Review of Systems   ?Review of Systems  ?Unable to perform ROS: Mental status change  ? ?Physical Exam ?Updated Vital Signs ?BP 119/72   Pulse 65   Temp 97.9 ?F (36.6 ?C) (Axillary)   Resp 15   Ht 6\' 3"  (1.905 m)   Wt 105 kg   SpO2 94%   BMI 28.93 kg/m?  ?Physical Exam ?Vitals and nursing note reviewed.  ?Constitutional:   ?   General: She is not in acute distress. ?   Appearance: Normal appearance. She is well-developed. She is not diaphoretic.  ?   Comments: Patient is somnolent and intermittently making nonsensical noises, arouses to sternal rub but has difficulty answering questions.  ?HENT:  ?   Head: Normocephalic and atraumatic.  ?   Nose: Nose normal.  ?   Mouth/Throat:  ?   Mouth: Mucous membranes are moist.  ?   Pharynx: Oropharynx is clear.  ?Eyes:  ?   General:     ?  Right eye: No discharge.     ?   Left eye: No discharge.  ?   Pupils: Pupils are equal, round, and reactive to light.  ?   Comments: Pupils are dilated but equal and reactive  ?Cardiovascular:  ?   Rate and Rhythm: Normal rate and regular rhythm.  ?   Pulses: Normal pulses.  ?   Heart sounds: Normal heart sounds.  ?Pulmonary:  ?   Effort: Pulmonary effort is normal. No respiratory distress.  ?   Breath sounds: Normal breath sounds. No wheezing or rales.  ?   Comments: Respirations equal and unlabored, patient able to speak in full sentences, lungs clear to auscultation bilaterally  ?Abdominal:  ?   General: Bowel sounds are normal. There is no distension.  ?   Palpations:  Abdomen is soft. There is no mass.  ?   Tenderness: There is no abdominal tenderness. There is no guarding.  ?   Comments: Abdomen soft, nondistended, nontender to palpation in all quadrants without guarding or peritoneal signs  ?Musculoskeletal:     ?   General: No deformity.  ?   Cervical back: Neck supple.  ?Skin: ?   General: Skin is warm and dry.  ?   Capillary Refill: Capillary refill takes less than 2 seconds.  ?Neurological:  ?   Mental Status: She is alert and oriented to person, place, and time.  ?   Coordination: Coordination normal.  ?   Comments: Currently unable to follow commands, speech is nonsensical. ?No facial droop and no obvious cranial nerve deficit. ?Patient is moving all extremities independently but will not follow commands for further neurologic examination.  ?Psychiatric:     ?   Mood and Affect: Mood normal.     ?   Behavior: Behavior normal.  ? ? ?ED Results / Procedures / Treatments   ?Labs ?(all labs ordered are listed, but only abnormal results are displayed) ?Labs Reviewed  ?COMPREHENSIVE METABOLIC PANEL - Abnormal; Notable for the following components:  ?    Result Value  ? Glucose, Bld 137 (*)   ? Creatinine, Ser 1.24 (*)   ? Calcium 8.8 (*)   ? GFR, Estimated 57 (*)   ? All other components within normal limits  ?CBC WITH DIFFERENTIAL/PLATELET - Abnormal; Notable for the following components:  ? Neutro Abs 8.1 (*)   ? All other components within normal limits  ?URINALYSIS, ROUTINE W REFLEX MICROSCOPIC - Abnormal; Notable for the following components:  ? APPearance HAZY (*)   ? Nitrite POSITIVE (*)   ? Leukocytes,Ua MODERATE (*)   ? Bacteria, UA RARE (*)   ? All other components within normal limits  ?RAPID URINE DRUG SCREEN, HOSP PERFORMED - Abnormal; Notable for the following components:  ? Tetrahydrocannabinol POSITIVE (*)   ? All other components within normal limits  ?SALICYLATE LEVEL - Abnormal; Notable for the following components:  ? Salicylate Lvl <7.0 (*)   ? All other  components within normal limits  ?ACETAMINOPHEN LEVEL - Abnormal; Notable for the following components:  ? Acetaminophen (Tylenol), Serum <10 (*)   ? All other components within normal limits  ?ETHANOL  ? ? ?EKG ?EKG Interpretation ? ?Date/Time:  Sunday January 21 2022 00:33:08 EDT ?Ventricular Rate:  71 ?PR Interval:  173 ?QRS Duration: 140 ?QT Interval:  408 ?QTC Calculation: 444 ?R Axis:   87 ?Text Interpretation: Sinus rhythm Right bundle branch block - new since?  last tracing in 2014 Otherwise no significant change Confirmed  by Drema Pry [id number]) on 01/21/2022 12:50:35 AM ? ?Radiology ?CT Head Wo Contrast ? ?Result Date: 01/21/2022 ?CLINICAL DATA:  Altered mental status. EXAM: CT HEAD WITHOUT CONTRAST TECHNIQUE: Contiguous axial images were obtained from the base of the skull through the vertex without intravenous contrast. RADIATION DOSE REDUCTION: This exam was performed according to the departmental dose-optimization program which includes automated exposure control, adjustment of the mA and/or kV according to patient size and/or use of iterative reconstruction technique. COMPARISON:  None. FINDINGS: Brain: No evidence of acute infarction, hemorrhage, hydrocephalus, extra-axial collection or mass lesion/mass effect. Vascular: No hyperdense vessel or unexpected calcification. Skull: Normal. Negative for fracture or focal lesion. Sinuses/Orbits: No acute finding. Other: None. IMPRESSION: No acute intracranial pathology. Electronically Signed   By: Aram Candela M.D.   On: 01/21/2022 01:36  ? ?DG Chest Port 1 View ? ?Result Date: 01/21/2022 ?CLINICAL DATA:  AMS, possible aspiration EXAM: PORTABLE CHEST 1 VIEW COMPARISON:  Chest x-ray 05/17/2018 FINDINGS: The heart and mediastinal contours are within normal limits. Low lung volumes. Bibasilar, left greater than right, patchy airspace opacities. No focal consolidation. No pulmonary edema. Likely trace bilateral pleural effusions. No pneumothorax. No acute  osseous abnormality. IMPRESSION: 1. Low lung volumes with bibasilar, left greater than right, patchy airspace opacities. Finding could represent a combination of infection/inflammation versus atelectasis. Followup PA and lateral

## 2022-01-21 NOTE — ED Notes (Signed)
Pt ambulated successfully to the restroom. O2 maintained 95% during ambulation  ?

## 2022-01-21 NOTE — ED Triage Notes (Signed)
Pt BIB EMS after pt's neighbors noticed a disturbance from her house. Bystanders stated that pt had run out of her house yelling that she could not breathe and then proceeded to collapse on the ground. Upon EMS arrival pt had aspirated and her airway was being suctioned at the scene. Pt reported to EMS that she had one CBD gummy but could not tell EMS hoe many mg.  ? ?VSS stable with EMS. ? ?

## 2022-01-21 NOTE — ED Notes (Signed)
RN reviewed discharge instructions with pt. Pt verbalized understanding and had no further questions. VSS upon discharge.  

## 2022-01-22 ENCOUNTER — Telehealth: Payer: Self-pay | Admitting: Internal Medicine

## 2022-01-22 NOTE — Telephone Encounter (Signed)
-----   Message from Randall Hiss, MD sent at 01/22/2022  9:04 AM EDT ----- ?Regarding: RE: can you take over Geoffrey's transgender care from Dr Everardo All ?Thanks so much Christina, and not too bad myself, hope you are doing great yourself, Dr. Lonzo Cloud would you be willing to take over this patients transgender care? SHe was seeing Gregary Signs before ?----- Message ----- ?From: Carlus Pavlov, MD ?Sent: 01/17/2022  10:32 AM EDT ?To: Randall Hiss, MD ?Subject: RE: can you take over Bernadean's transgender# ? ?Hi Kees, ?I am not doing transgender medicine, unfortunately.  In our office, Romero Belling and [facility] are doing this.  You may want to run this by Abby. ?Sorry I cannot help.  ?I hope you are doing well! ?Silvestre Mesi ?----- Message ----- ?From: Daiva Eves, Lisette Grinder, MD ?Sent: 01/16/2022  11:43 AM EDT ?To: Carlus Pavlov, MD ?Subject: can you take over Krisa's transgender car# ? ?Trula Ore I realize you are out of the office but I thought I would send you this note Geri Seminole is a longtime patient of ours in the clinic who is a transgender woman who shot was helping with hormone therapy. ? ?She is wanting to change some recurrent regimen involving patches to either injections of estradiol or pills and want to discuss further. ? ?She also is concerned about being prediabetic with an A1c of 6.1 I am changing her HIV regimen to an integrase strand transfer inhibitor which does pose a risk of her gaining further weight I have counseled her that losing weight would be the main thing she would need to do to try to avoid becoming diabetic. ? ? ? ? ? ?

## 2022-01-22 NOTE — Telephone Encounter (Signed)
Team, ? ?Can you please schedule this patient for follow-up with me on the next available follow-up visit for transgender care? ? ? ?She was seeing Dr. Everardo All and the last time she saw him was July 2022 ? ? ?Thank you ?

## 2022-01-29 ENCOUNTER — Ambulatory Visit (INDEPENDENT_AMBULATORY_CARE_PROVIDER_SITE_OTHER): Payer: BC Managed Care – PPO | Admitting: Internal Medicine

## 2022-01-29 ENCOUNTER — Encounter: Payer: Self-pay | Admitting: Internal Medicine

## 2022-01-29 VITALS — BP 122/76 | HR 70 | Ht 75.0 in | Wt 250.0 lb

## 2022-01-29 DIAGNOSIS — Z789 Other specified health status: Secondary | ICD-10-CM

## 2022-01-29 DIAGNOSIS — R7303 Prediabetes: Secondary | ICD-10-CM

## 2022-01-29 MED ORDER — ESTRADIOL 2 MG PO TABS: 2.0000 mg | ORAL_TABLET | Freq: Every day | ORAL | 1 refills | Status: DC

## 2022-01-29 NOTE — Progress Notes (Signed)
? ?Name: Renee Malone  ?MRN/ DOB: [id number], 07/27/1986    ?Age/ Sex: 36 y.o., adult   ? ? ?PCP: Renee Panda, NP   ?Reason for Endocrinology Evaluation: Gender dysphoria  ?   ?Initial Endocrinology Clinic Visit: 10/27/2020  ? ? ?PATIENT IDENTIFIER: Renee Malone is a 36 y.o., adult with a past medical history of HIV, gender dysphoria. She has followed with [facility] since 10/27/2020 for consultative assistance with management of her gender dysphoria.  ? ?HISTORICAL SUMMARY: The patient has been on gender affirming therapy since 2010.  She is status post breast reconstruction surgery in 2019. ? ? ?She is status post vaginoplasty 07/2020 with revision of vaginal introitus and labia 05/2021 ? ? ? ?SUBJECTIVE:  ? ? ?Today (01/29/2022):  Renee Malone is here for for follow-up on gender dysphoria.  Patient is a transgender female. ? ? ? ?ED visit for drug overdose after ingesting CBD Gummies.01/21/2022 ? ?Has been on Climara patch - but  is causing a rash  ?Has noted decrease in hair loss since orchectomy  ?Has occasional headaches  ?Denies HTN  ?Denies DVT or PE  ?Great grandmother and great aunt with breast Ca ? ?Patient has started making lifestyle changes with low-carb diet and exercise to improve her A1c ? ? ? ?HISTORY:  ?Past Medical History:  ?Past Medical History:  ?Diagnosis Date  ? Anxiety   ? panic attacks  ? Elevated serum creatinine 06/10/2019  ? Heart murmur   ? HIV infection (HCC)   ? Low hemoglobin 05/25/2020  ? Prediabetes 05/25/2020  ? ?Past Surgical History:  ?Past Surgical History:  ?Procedure Laterality Date  ? BREAST ENHANCEMENT SURGERY Bilateral 02/25/2018  ? Procedure: MAMMOPLASTY AUGMENTATION WITH SILICONE IMPLANTS;  Surgeon: Glenna Fellows, MD;  Location: [facility];  Service: Plastics;  Laterality: Bilateral;  ? BREAST LUMPECTOMY Right 02/25/2018  ? Procedure: EXCISION RIGHT BREAST MASS;  Surgeon: Glenna Fellows, MD;  Location:   SURGERY CENTER;  Service: Plastics;  Laterality: Right;  ? SKIN GRAFT    ? ?Social History:  reports that she has never smoked. She has never used smokeless tobacco. She reports current alcohol use of about 1.0 standard drink per week. She reports that she does not use drugs. ?Family History:  ?Family History  ?Problem Relation Age of Onset  ? Hypertension Maternal Grandmother   ? ? ? ?HOME MEDICATIONS: ?Allergies as of 01/29/2022   ? ?   Reactions  ? Broccoli [brassica Oleracea] Anaphylaxis  ? ?  ? ?  ?Medication List  ?  ? ?  ? Accurate as of January 29, 2022  1:12 PM. If you have any questions, ask your nurse or doctor.  ?  ?  ? ?  ? ?ALPRAZolam 1 MG tablet ?Commonly known as: Prudy Feeler ?TAKE ONE TABLET BY MOUTH EVERY 8 HOURS AS NEEDED ?  ?Biktarvy 50-200-25 MG Tabs tablet ?Generic drug: bictegravir-emtricitabine-tenofovir AF ?Take 1 tablet by mouth daily. ?  ?estradiol 0.0375 mg/24hr patch ?Commonly known as: CLIMARA - Dosed in mg/24 hr ?Place 1 patch (0.0375 mg total) onto the skin once a week. ?  ?hydrocortisone 1 % ointment ?Apply 1 application topically 2 (two) times daily. ?  ? ?  ? ? ? ? ?OBJECTIVE:  ? ?PHYSICAL EXAM: ?VS: BP 122/76 (BP Location: Left Arm, Patient Position: Sitting, Cuff Size: Small)   Pulse 70   Ht 6\' 3"  (1.905 m)   Wt 250 lb (113.4 kg)   SpO2 96%  BMI 31.25 kg/m?  ? ?EXAM: ?General: Pt appears well and is in NAD  ?Neck: General: Supple without adenopathy. ?Thyroid: Thyroid size normal.  No goiter or nodules appreciated. No thyroid bruit.  ?Lungs: Clear with good BS bilat with no rales, rhonchi, or wheezes  ?Heart: Auscultation: RRR.  ?Abdomen: Normoactive bowel sounds, soft, nontender, without masses or organomegaly palpable  ?Extremities:  ?BL LE: No pretibial edema normal ROM and strength.  ?Mental Status: Judgment, insight: Intact ?Orientation: Oriented to time, place, and person ?Mood and affect: No depression, anxiety, or agitation  ? ? ? ?DATA REVIEWED: ? Latest Reference Range &  Units Most Recent  ?Estradiol < OR = 29 pg/mL 16 ?05/02/21 15:56  ?Estradiol, Free pg/mL 0.37 ?05/02/21 15:56  ?Estrogen 60 - 190 pg/mL 117.9 ?08/03/20 14:24  ?Progesterone <1.4 ng/mL <0.5 ?08/03/20 14:24  ?Testosterone 264 - 916 ng/dL <3 (L) ?0/53/97 67:34  ?Testosterone Free 8.7 - 25.1 pg/mL 0.2 (L) ?05/02/21 15:56  ?(L): Data is abnormally low ? ? Latest Reference Range & Units 01/01/22 02:56  ?Hemoglobin A1C <5.7 % of total Hgb 6.1 (H)  ? ? ? ? ?ASSESSMENT / PLAN / RECOMMENDATIONS:  ? ?Female to Female Transgender  ? ? ?-Patient is not placed with the estrogen patches due to development of skin rash ?-We will switch to oral estradiol as below ?-We will repeat labs in 8 weeks to include estradiol and testosterone ? ? ?Medications  ?Stop Climara patch ?Start estradiol 2 mg daily ? ?2. Prediabetes : ? ?-A1c 6.1% ?- Pt has made lifestyle changes with low carb diet and exercise  ?- I have recommended brisk walking ~150 minutes per week  ? ?Follow-up in 4 months ?Signed electronically by: ?Abby Raelyn Mora, MD ? ?[facility]  ?[facility] ?[address] ?[place], [place] [address] ?Phone: [phone] ?FAX: [phone]  ? ? ? ? ?CC: ?Renee Panda, NP ?[address] ?[place] [address] ?Phone: [phone]  ?Fax: [phone] ? ? ?Return to Endocrinology clinic as below: ?Future Appointments  ?Date Time Provider Department Center  ?01/29/2022  3:00 PM Ayaz Sondgeroth, Konrad Dolores, MD [facility] None  ?02/12/2022  4:15 PM Daiva Eves, Lisette Grinder, MD [facility]  ?  ? ?

## 2022-01-29 NOTE — Patient Instructions (Signed)
-   Stop Climara Patch  ?- Start Estrdiol 2 mg , 1 tablet daily  ?

## 2022-02-12 ENCOUNTER — Other Ambulatory Visit: Payer: Self-pay

## 2022-02-12 ENCOUNTER — Ambulatory Visit (INDEPENDENT_AMBULATORY_CARE_PROVIDER_SITE_OTHER): Payer: BC Managed Care – PPO | Admitting: Infectious Disease

## 2022-02-12 ENCOUNTER — Encounter: Payer: Self-pay | Admitting: Infectious Disease

## 2022-02-12 VITALS — BP 113/79 | HR 65 | Temp 98.2°F | Wt 233.0 lb

## 2022-02-12 DIAGNOSIS — Z789 Other specified health status: Secondary | ICD-10-CM

## 2022-02-12 DIAGNOSIS — Z6831 Body mass index (BMI) 31.0-31.9, adult: Secondary | ICD-10-CM

## 2022-02-12 DIAGNOSIS — R635 Abnormal weight gain: Secondary | ICD-10-CM

## 2022-02-12 DIAGNOSIS — B20 Human immunodeficiency virus [HIV] disease: Secondary | ICD-10-CM

## 2022-02-12 DIAGNOSIS — E669 Obesity, unspecified: Secondary | ICD-10-CM | POA: Diagnosis not present

## 2022-02-12 DIAGNOSIS — R7303 Prediabetes: Secondary | ICD-10-CM

## 2022-02-12 HISTORY — DX: Abnormal weight gain: R63.5

## 2022-02-12 NOTE — Progress Notes (Signed)
? ?Subjective:  ?Chief complaint: followup for HIV disease after switch to [facility] ? Patient ID: Renee Malone, adult    DOB: 1986/04/28, 36 y.o.   MRN: [id number] ? ?HPI ? ?36 year old transgender Black woman with HIV that has perfectly controlled on Odefsey but had concerns about potentially missing doses and that reason we changed to [facility] with its higher barrier to resistance. ? ?She is going to see surgery at [facility] but not wanting to have the types of plastic surgery that were mentioned in terms at least the harvest sites. ? ?She had been struggling with weight gain and we have had warning about potential increase in weight gain with changed integrase strand transfer inhibitor. ? ?She has tried to get placed on Ozempic and other drugs but her insurance is denied it saying that she has to have type 2 diabetes.  She has indeed prediabetic at this point time and has been seen now by endocrinology as well. ? ? ? ?Past Medical History:  ?Diagnosis Date  ? Anxiety   ? panic attacks  ? Elevated serum creatinine 06/10/2019  ? Heart murmur   ? HIV infection (HCC)   ? Low hemoglobin 05/25/2020  ? Prediabetes 05/25/2020  ? ? ?Past Surgical History:  ?Procedure Laterality Date  ? BREAST ENHANCEMENT SURGERY Bilateral 02/25/2018  ? Procedure: MAMMOPLASTY AUGMENTATION WITH SILICONE IMPLANTS;  Surgeon: Glenna Fellows, MD;  Location: [facility];  Service: Plastics;  Laterality: Bilateral;  ? BREAST LUMPECTOMY Right 02/25/2018  ? Procedure: EXCISION RIGHT BREAST MASS;  Surgeon: Glenna Fellows, MD;  Location: [facility];  Service: Plastics;  Laterality: Right;  ? SKIN GRAFT    ? ? ?Family History  ?Problem Relation Age of Onset  ? Hypertension Maternal Grandmother   ? ? ?  ?Social History  ? ?Socioeconomic History  ? Marital status: Married  ?  Spouse name: Not on file  ? Number of children: Not on file  ? Years of education: Not on file  ? Highest education level: Not on file   ?Occupational History  ? Not on file  ?Tobacco Use  ? Smoking status: Never  ? Smokeless tobacco: Never  ?Substance and Sexual Activity  ? Alcohol use: Yes  ?  Alcohol/week: 1.0 standard drink  ?  Types: 1 Standard drinks or equivalent per week  ?  Comment: rarely  ? Drug use: No  ? Sexual activity: Not Currently  ?  Partners: Male  ?Other Topics Concern  ? Not on file  ?Social History Narrative  ? Lives alone.   ? ?Social Determinants of Health  ? ?Financial Resource Strain: Not on file  ?Food Insecurity: Not on file  ?Transportation Needs: Not on file  ?Physical Activity: Not on file  ?Stress: Not on file  ?Social Connections: Not on file  ? ? ?Allergies  ?Allergen Reactions  ? Broccoli [Brassica Oleracea] Anaphylaxis  ? ? ? ?Current Outpatient Medications:  ?  bictegravir-emtricitabine-tenofovir AF (BIKTARVY) 50-200-25 MG TABS tablet, Take 1 tablet by mouth daily., Disp: 30 tablet, Rfl: 11 ?  estradiol (ESTRACE) 2 MG tablet, Take 1 tablet (2 mg total) by mouth daily., Disp: 90 tablet, Rfl: 1 ?  hydrocortisone 1 % ointment, Apply 1 application topically 2 (two) times daily., Disp: 30 g, Rfl: 2 ? ? ?Review of Systems  ?Constitutional:  Negative for activity change, appetite change, chills, diaphoresis, fatigue, fever and unexpected weight change.  ?HENT:  Negative for congestion, rhinorrhea, sinus pressure, sneezing,  sore throat and trouble swallowing.   ?Eyes:  Negative for photophobia and visual disturbance.  ?Respiratory:  Negative for cough, chest tightness, shortness of breath, wheezing and stridor.   ?Cardiovascular:  Negative for chest pain, palpitations and leg swelling.  ?Gastrointestinal:  Negative for abdominal distention, abdominal pain, anal bleeding, blood in stool, constipation, diarrhea, nausea and vomiting.  ?Genitourinary:  Negative for difficulty urinating, dysuria, flank pain and hematuria.  ?Musculoskeletal:  Negative for arthralgias, back pain, gait problem, joint swelling and myalgias.   ?Skin:  Negative for color change, pallor, rash and wound.  ?Neurological:  Negative for dizziness, tremors, weakness, light-headedness and headaches.  ?Hematological:  Negative for adenopathy. Does not bruise/bleed easily.  ?Psychiatric/Behavioral:  Negative for agitation, behavioral problems, confusion, decreased concentration, dysphoric mood, sleep disturbance and suicidal ideas.   ? ?   ?Objective:  ? Physical Exam ?Constitutional:   ?   Appearance: She is well-developed.  ?HENT:  ?   Head: Normocephalic and atraumatic.  ?Eyes:  ?   Conjunctiva/sclera: Conjunctivae normal.  ?Cardiovascular:  ?   Rate and Rhythm: Normal rate and regular rhythm.  ?Pulmonary:  ?   Effort: Pulmonary effort is normal. No respiratory distress.  ?   Breath sounds: No wheezing.  ?Abdominal:  ?   General: There is no distension.  ?   Palpations: Abdomen is soft.  ?Musculoskeletal:     ?   General: No tenderness. Normal range of motion.  ?   Cervical back: Normal range of motion and neck supple.  ?Skin: ?   General: Skin is warm and dry.  ?   Coloration: Skin is not pale.  ?   Findings: No erythema or rash.  ?Neurological:  ?   General: No focal deficit present.  ?   Mental Status: She is alert and oriented to person, place, and time.  ?Psychiatric:     ?   Mood and Affect: Mood normal.     ?   Behavior: Behavior normal.     ?   Thought Content: Thought content normal.     ?   Judgment: Judgment normal.  ? ? ? ? ? ?   ?Assessment & Plan:  ?HIV: ? ?We will check a repeat viral load ? ?We will continue BIKTARVY have scheduled her to come see me in August so we can make sure that we renew the P Program on time. ?  ?Weight gain , Obesity, Prediabetes: hopefully not worse with switch to INSTI. She is interested in [employer]  but insurance would not cover. Suggested carbohydrate restriction and I will also refer to weight management clinic ? ? ?Transgender health : following with Endocrine here with [facility] and also at [facility] for possible further  surgery ? ? ?

## 2022-02-15 LAB — HIV-1 RNA QUANT-NO REFLEX-BLD
HIV 1 RNA Quant: NOT DETECTED copies/mL
HIV-1 RNA Quant, Log: NOT DETECTED Log copies/mL

## 2022-02-28 ENCOUNTER — Encounter: Payer: Self-pay | Admitting: Infectious Diseases

## 2022-04-02 ENCOUNTER — Other Ambulatory Visit: Payer: BC Managed Care – PPO

## 2022-05-03 IMAGING — CT CT HEAD W/O CM
4 series · 17 of 47 positions shown, 19 images · non-contrast
Comparison: None.

CLINICAL DATA: Altered mental status.



[Series 3: head wo · axial · 0.47mm/px · z∈[-167,-37]mm · 7 of 36 slices shown, 9 images]
[im 5/36  brain]
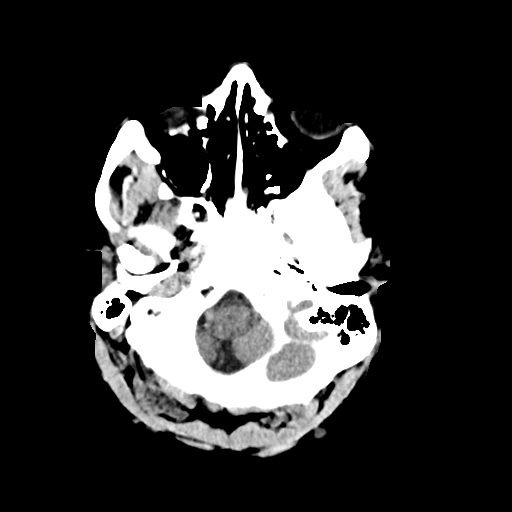
[im 5/36  bone]
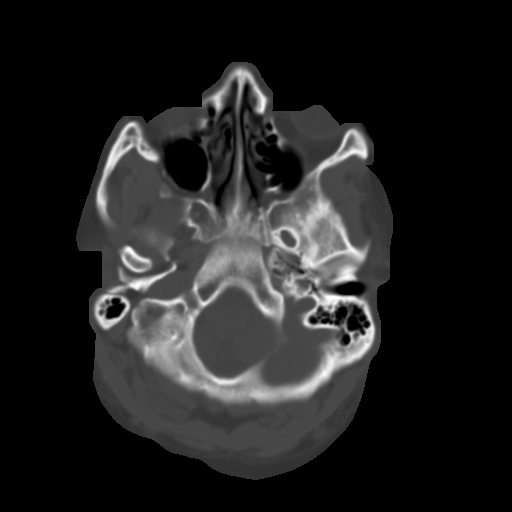
[im 9/36  brain]
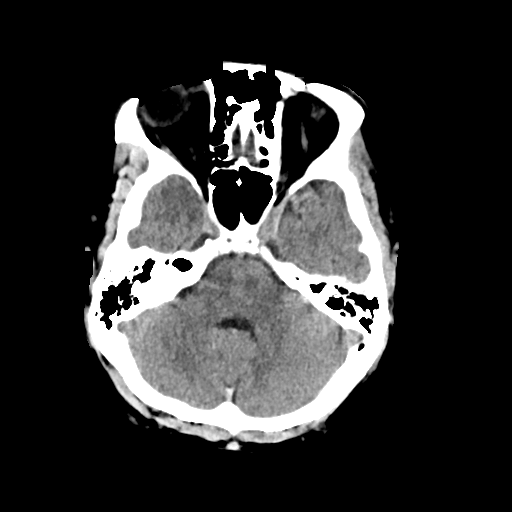
[im 14/36  brain]
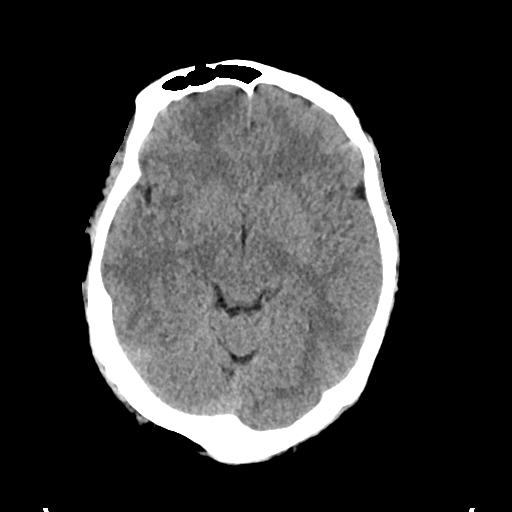
[im 18/36  brain]
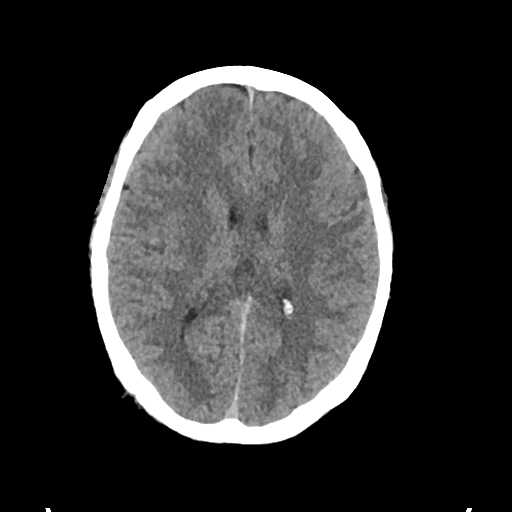
[im 22/36  brain]
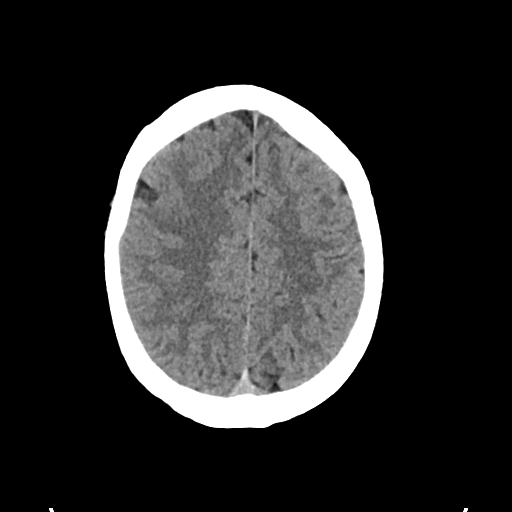
[im 22/36  bone]
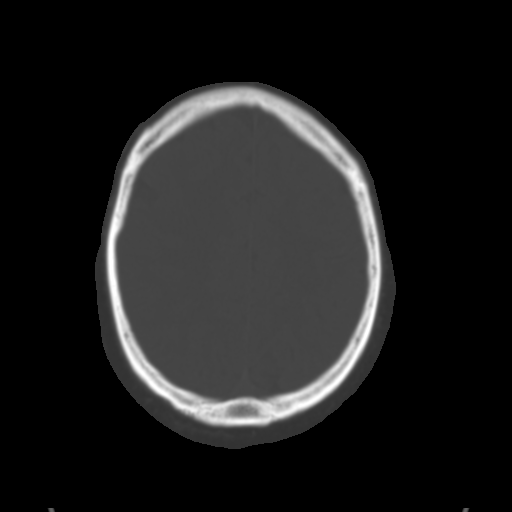
[im 27/36  brain]
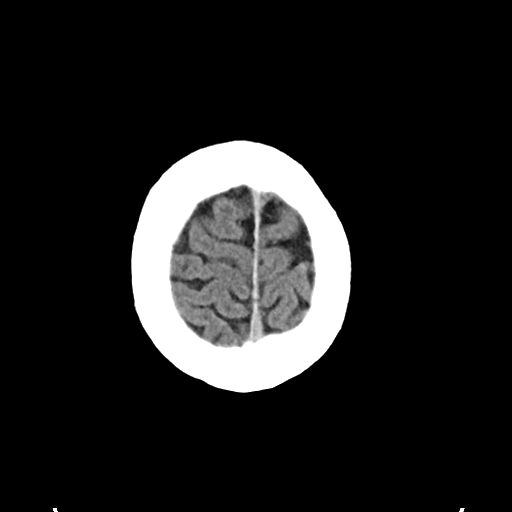
[im 31/36  brain]
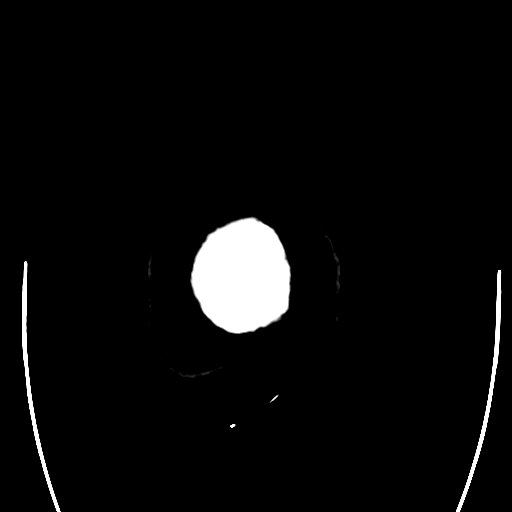

[Series 4: head bone · axial · 0.47mm/px · z∈[-171,-109]mm · 4 of 89 slices shown]
[im 9/89  bone]
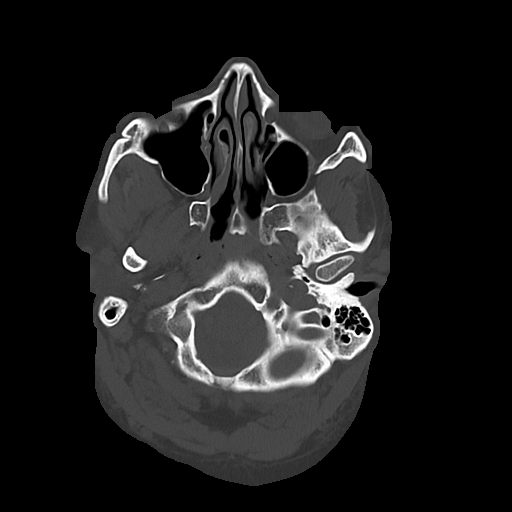
[im 18/89  bone]
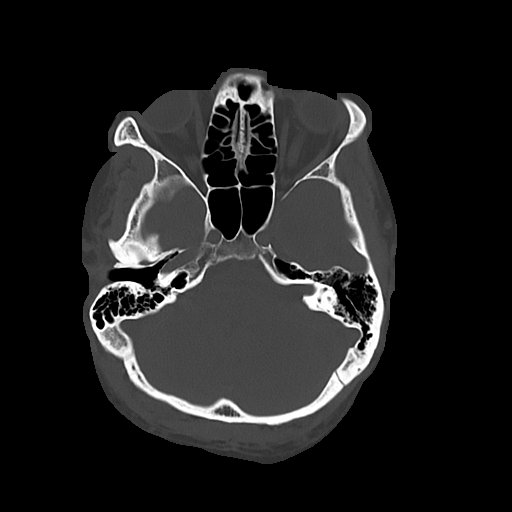
[im 27/89  bone]
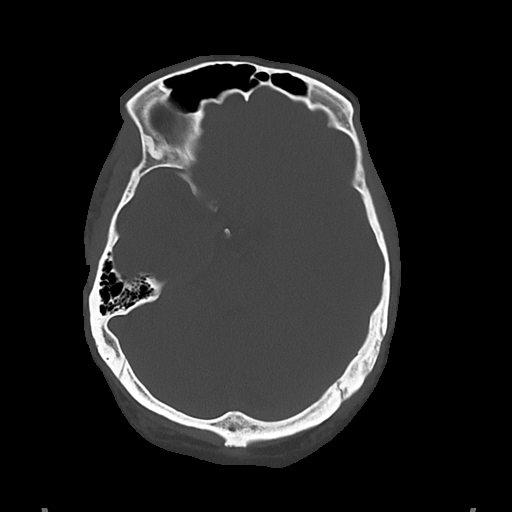
[im 40/89  bone]
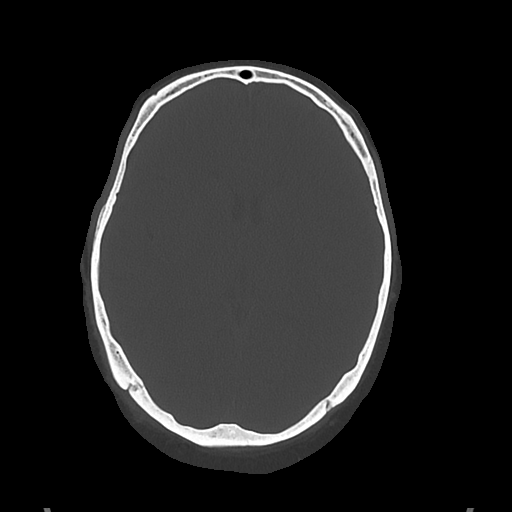

[Series 5: cor soft · coronal · 0.36mm/px · 3 of 73 slices shown]
[im 25/73  brain]
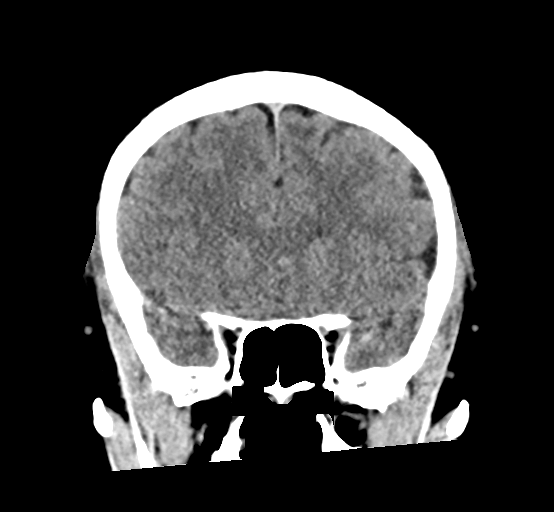
[im 33/73  brain]
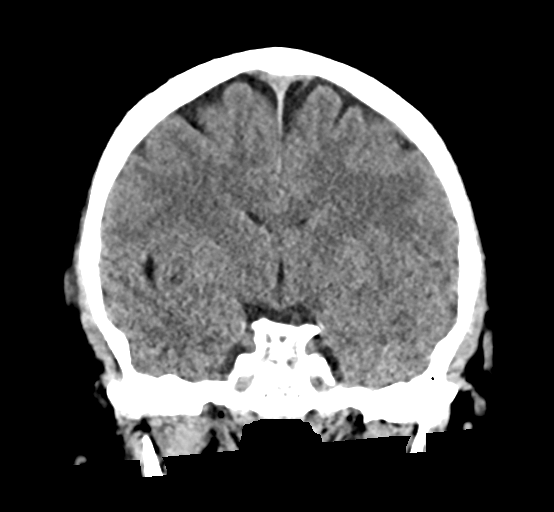
[im 41/73  brain]
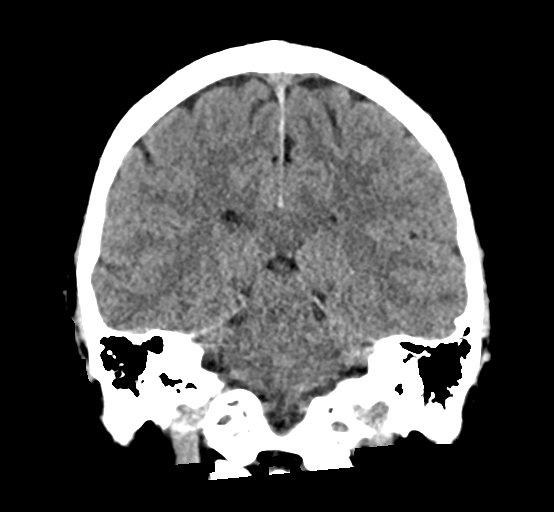

[Series 6: sag soft · sagittal · 0.36mm/px · 3 of 66 slices shown]
[im 22/66  brain]
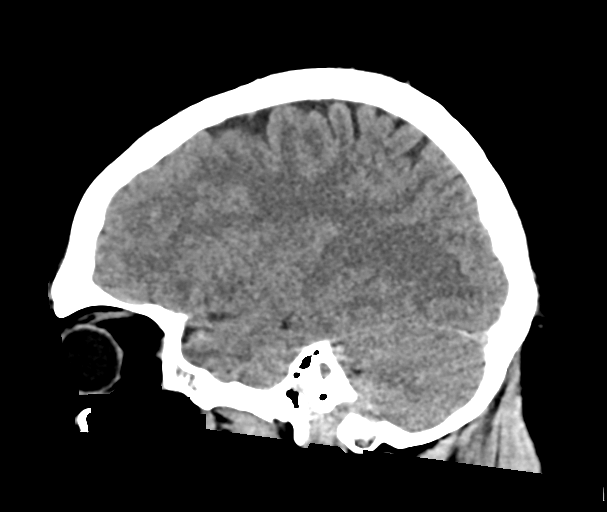
[im 33/66  brain]
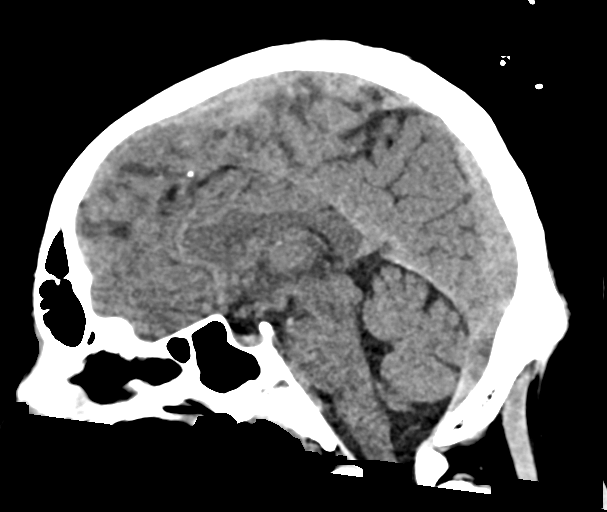
[im 44/66  brain]
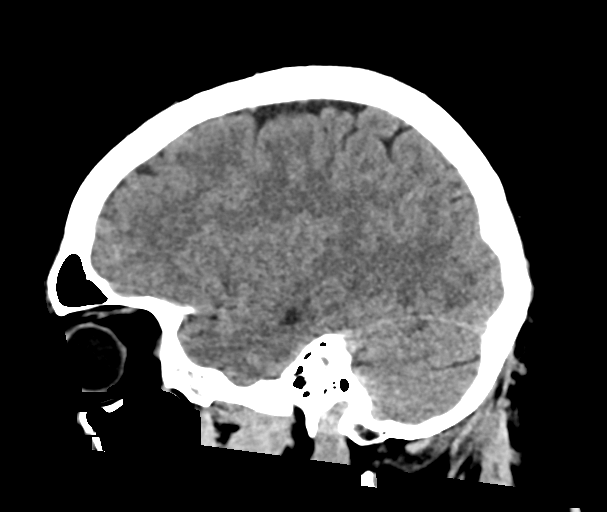

[17 of 47 positions shown; findings below may reference images not displayed]

FINDINGS: Brain: No evidence of acute infarction, hemorrhage, hydrocephalus,
extra-axial collection or mass lesion/mass effect.

Vascular: No hyperdense vessel or unexpected calcification.

Skull: Normal. Negative for fracture or focal lesion.

Sinuses/Orbits: No acute finding.

Other: None.
IMPRESSION: No acute intracranial pathology.

## 2022-05-03 IMAGING — DX DG CHEST 1V PORT
1 series · 1 of 1 positions shown · non-contrast
Comparison: Chest x-ray 05/17/2018

CLINICAL DATA: AMS, possible aspiration

EXAM:
PORTABLE CHEST 1 VIEW

[chest ap]
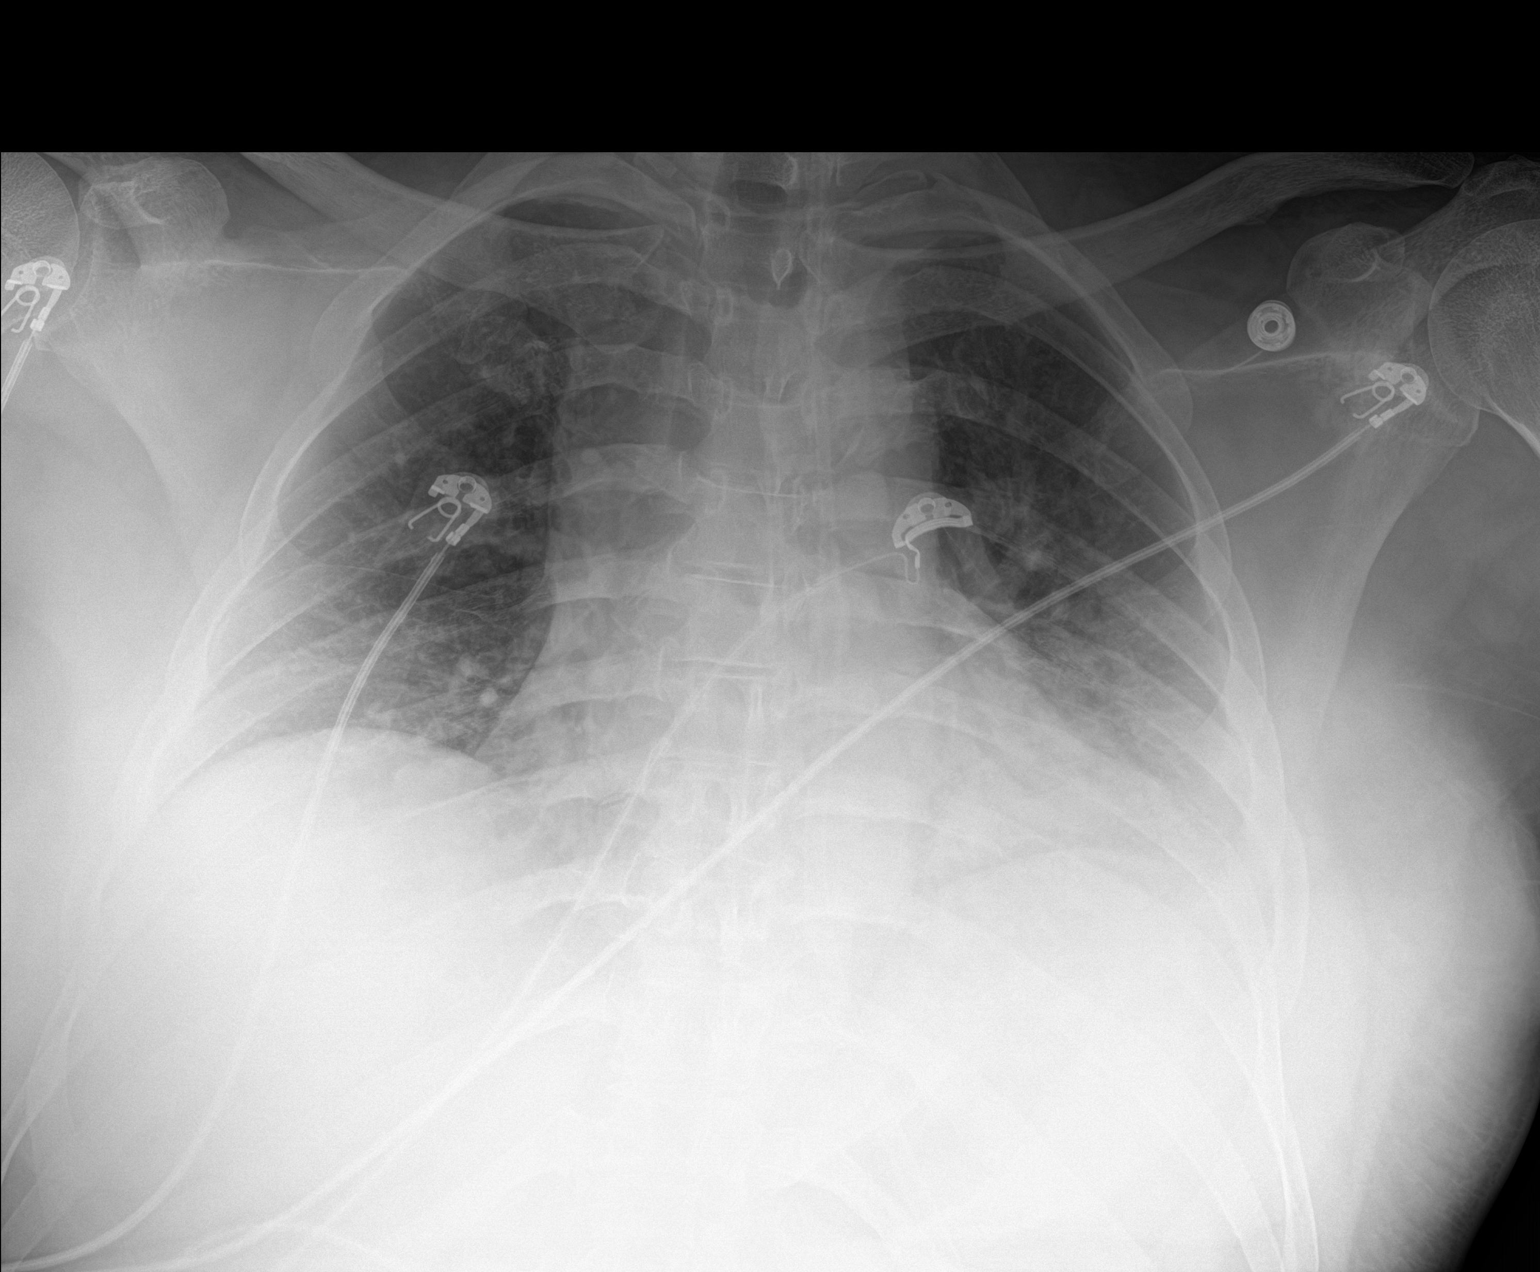

[1 of 1 positions shown; findings below may reference images not displayed]

FINDINGS: The heart and mediastinal contours are within normal limits.

Low lung volumes. Bibasilar, left greater than right, patchy
airspace opacities. No focal consolidation. No pulmonary edema.
Likely trace bilateral pleural effusions. No pneumothorax.

No acute osseous abnormality.
IMPRESSION: 1. Low lung volumes with bibasilar, left greater than right, patchy
airspace opacities. Finding could represent a combination of
infection/inflammation versus atelectasis. Followup PA and lateral
chest X-ray is recommended in 3-4 weeks following therapy to ensure
resolution and exclude underlying malignancy.
2. Likely trace bilateral pleural effusions.

## 2022-05-14 ENCOUNTER — Ambulatory Visit: Payer: BC Managed Care – PPO | Admitting: Infectious Disease

## 2022-05-30 DIAGNOSIS — Z0289 Encounter for other administrative examinations: Secondary | ICD-10-CM

## 2022-06-04 ENCOUNTER — Encounter (INDEPENDENT_AMBULATORY_CARE_PROVIDER_SITE_OTHER): Payer: Self-pay | Admitting: Family Medicine

## 2022-06-04 ENCOUNTER — Ambulatory Visit (INDEPENDENT_AMBULATORY_CARE_PROVIDER_SITE_OTHER): Payer: 59 | Admitting: Family Medicine

## 2022-06-04 VITALS — BP 110/74 | HR 55 | Temp 97.9°F | Ht 75.0 in | Wt 249.0 lb

## 2022-06-04 DIAGNOSIS — R0602 Shortness of breath: Secondary | ICD-10-CM

## 2022-06-04 DIAGNOSIS — F649 Gender identity disorder, unspecified: Secondary | ICD-10-CM | POA: Insufficient documentation

## 2022-06-04 DIAGNOSIS — Z1331 Encounter for screening for depression: Secondary | ICD-10-CM

## 2022-06-04 DIAGNOSIS — R5383 Other fatigue: Secondary | ICD-10-CM | POA: Diagnosis not present

## 2022-06-04 DIAGNOSIS — E669 Obesity, unspecified: Secondary | ICD-10-CM

## 2022-06-04 DIAGNOSIS — R7303 Prediabetes: Secondary | ICD-10-CM | POA: Diagnosis not present

## 2022-06-04 DIAGNOSIS — B20 Human immunodeficiency virus [HIV] disease: Secondary | ICD-10-CM

## 2022-06-04 DIAGNOSIS — Z6831 Body mass index (BMI) 31.0-31.9, adult: Secondary | ICD-10-CM

## 2022-06-05 ENCOUNTER — Ambulatory Visit: Payer: BC Managed Care – PPO | Admitting: Internal Medicine

## 2022-06-05 LAB — CBC WITH DIFFERENTIAL/PLATELET
Basophils Absolute: 0.1 10*3/uL (ref 0.0–0.2)
Basos: 1 %
EOS (ABSOLUTE): 0.1 10*3/uL (ref 0.0–0.4)
Eos: 2 %
Hematocrit: 39.6 % (ref 34.0–46.6)
Hemoglobin: 13.4 g/dL (ref 11.1–15.9)
Immature Grans (Abs): 0 10*3/uL (ref 0.0–0.1)
Immature Granulocytes: 0 %
Lymphocytes Absolute: 1.7 10*3/uL (ref 0.7–3.1)
Lymphs: 38 %
MCH: 30.9 pg (ref 26.6–33.0)
MCHC: 33.8 g/dL (ref 31.5–35.7)
MCV: 92 fL (ref 79–97)
Monocytes Absolute: 0.4 10*3/uL (ref 0.1–0.9)
Monocytes: 9 %
Neutrophils Absolute: 2.3 10*3/uL (ref 1.4–7.0)
Neutrophils: 50 %
Platelets: 259 10*3/uL (ref 150–450)
RBC: 4.33 x10E6/uL (ref 3.77–5.28)
RDW: 12.6 % (ref 11.7–15.4)
WBC: 4.6 10*3/uL (ref 3.4–10.8)

## 2022-06-05 LAB — HEMOGLOBIN A1C
Est. average glucose Bld gHb Est-mCnc: 128 mg/dL
Hgb A1c MFr Bld: 6.1 % — ABNORMAL HIGH (ref 4.8–5.6)

## 2022-06-05 LAB — COMPREHENSIVE METABOLIC PANEL
ALT: 17 IU/L (ref 0–32)
AST: 19 IU/L (ref 0–40)
Albumin/Globulin Ratio: 1.9 (ref 1.2–2.2)
Albumin: 4.7 g/dL (ref 3.9–4.9)
Alkaline Phosphatase: 66 IU/L (ref 44–121)
BUN/Creatinine Ratio: 10 (ref 9–23)
BUN: 13 mg/dL (ref 6–20)
Bilirubin Total: 0.2 mg/dL (ref 0.0–1.2)
CO2: 22 mmol/L (ref 20–29)
Calcium: 9.8 mg/dL (ref 8.7–10.2)
Chloride: 103 mmol/L (ref 96–106)
Creatinine, Ser: 1.24 mg/dL — ABNORMAL HIGH (ref 0.57–1.00)
Globulin, Total: 2.5 g/dL (ref 1.5–4.5)
Glucose: 91 mg/dL (ref 70–99)
Potassium: 4.6 mmol/L (ref 3.5–5.2)
Sodium: 140 mmol/L (ref 134–144)
Total Protein: 7.2 g/dL (ref 6.0–8.5)
eGFR: 57 mL/min/{1.73_m2} — ABNORMAL LOW (ref >59)

## 2022-06-05 LAB — TSH: TSH: 1.42 u[IU]/mL (ref 0.450–4.500)

## 2022-06-05 LAB — LIPID PANEL
Chol/HDL Ratio: 2.9 ratio (ref 0.0–4.4)
Cholesterol, Total: 194 mg/dL (ref 100–199)
HDL: 68 mg/dL (ref >39)
LDL Chol Calc (NIH): 113 mg/dL — ABNORMAL HIGH (ref 0–99)
Triglycerides: 70 mg/dL (ref 0–149)
VLDL Cholesterol Cal: 13 mg/dL (ref 5–40)

## 2022-06-05 LAB — INSULIN, RANDOM: INSULIN: 17.5 u[IU]/mL (ref 2.6–24.9)

## 2022-06-05 LAB — VITAMIN D 25 HYDROXY (VIT D DEFICIENCY, FRACTURES): Vit D, 25-Hydroxy: 27.7 ng/mL — ABNORMAL LOW (ref 30.0–100.0)

## 2022-06-05 LAB — T4, FREE: Free T4: 1.19 ng/dL (ref 0.82–1.77)

## 2022-06-05 LAB — VITAMIN B12: Vitamin B-12: 825 pg/mL (ref 232–1245)

## 2022-06-05 NOTE — Progress Notes (Deleted)
Name: Renee Malone  MRN/ DOB: [id number], 1986-06-01    Age/ Sex: 36 y.o., adult     PCP: Marva Panda, NP   Reason for Endocrinology Evaluation: Gender dysphoria     Initial Endocrinology Clinic Visit: 10/27/2020    PATIENT IDENTIFIER: Renee Malone is a 36 y.o., adult with a past medical history of HIV, gender dysphoria. She has followed with [facility] since 10/27/2020 for consultative assistance with management of her gender dysphoria.   HISTORICAL SUMMARY: The patient has been on gender affirming therapy since 2010.  She is status post breast reconstruction surgery in 2019.   She is status post vaginoplasty 07/2020 with revision of vaginal introitus and labia 05/2021    SUBJECTIVE:    Today (06/05/2022):  [name] is here for for follow-up on gender dysphoria.  Patient is a transgender female.    ED visit for drug overdose after ingesting CBD Gummies.01/21/2022  Has been on Climara patch - but  is causing a rash  Has noted decrease in hair loss since orchectomy  Has occasional headaches  Denies HTN  Denies DVT or PE  Great grandmother and great aunt with breast Ca  Patient has started making lifestyle changes with low-carb diet and exercise to improve her A1c    HISTORY:  Past Medical History:  Past Medical History:  Diagnosis Date   Anxiety    panic attacks   Elevated serum creatinine 06/10/2019   Heart murmur    HIV infection (HCC)    Lactose intolerance    Low hemoglobin 05/25/2020   Multiple food allergies    Prediabetes 05/25/2020   SOBOE (shortness of breath on exertion)    Weight gain 02/12/2022   Past Surgical History:  Past Surgical History:  Procedure Laterality Date   BREAST ENHANCEMENT SURGERY Bilateral 02/25/2018   Procedure: MAMMOPLASTY AUGMENTATION WITH SILICONE IMPLANTS;  Surgeon: Glenna Fellows, MD;  Location: [facility];  Service: Plastics;  Laterality: Bilateral;    BREAST LUMPECTOMY Right 02/25/2018   Procedure: EXCISION RIGHT BREAST MASS;  Surgeon: Glenna Fellows, MD;  Location: [facility];  Service: Plastics;  Laterality: Right;   SKIN GRAFT     Social History:  reports that she quit smoking about 13 years ago. Her smoking use included cigarettes. She smoked an average of .5 packs per day. She has never used smokeless tobacco. She reports current alcohol use of about 1.0 standard drink of alcohol per week. She reports that she does not use drugs. Family History:  Family History  Problem Relation Age of Onset   Hypertension Mother    Depression Mother    Anxiety disorder Mother    Bipolar disorder Mother    Schizophrenia Mother    Sleep apnea Mother    Drug abuse Mother    Obesity Mother    Hypertension Maternal Grandmother      HOME MEDICATIONS: Allergies as of 06/05/2022       Reactions   Broccoli [brassica Oleracea] Anaphylaxis   Other Anaphylaxis   Cauliflower        Medication List        Accurate as of June 05, 2022 10:56 AM. If you have any questions, ask your nurse or doctor.          ALPRAZolam 1 MG tablet Commonly known as: XANAX TAKE ONE TABLET BY MOUTH EVERY 8 HOURS AS NEEDED   Biktarvy 50-200-25 MG Tabs tablet Generic drug: bictegravir-emtricitabine-tenofovir AF Take 1 tablet by  mouth daily.   estradiol 2 MG tablet Commonly known as: ESTRACE Take 1 tablet (2 mg total) by mouth daily.   hydrocortisone 1 % ointment Apply 1 application topically 2 (two) times daily.          OBJECTIVE:   PHYSICAL EXAM: VS: There were no vitals taken for this visit.  EXAM: General: Pt appears well and is in NAD  Neck: General: Supple without adenopathy. Thyroid: Thyroid size normal.  No goiter or nodules appreciated. No thyroid bruit.  Lungs: Clear with good BS bilat with no rales, rhonchi, or wheezes  Heart: Auscultation: RRR.  Abdomen: Normoactive bowel sounds, soft, nontender, without masses  or organomegaly palpable  Extremities:  BL LE: No pretibial edema normal ROM and strength.  Mental Status: Judgment, insight: Intact Orientation: Oriented to time, place, and person Mood and affect: No depression, anxiety, or agitation     DATA REVIEWED:  Latest Reference Range & Units Most Recent  Estradiol < OR = 29 pg/mL 16 05/02/21 15:56  Estradiol, Free pg/mL 0.37 05/02/21 15:56  Estrogen 60 - 190 pg/mL 117.9 08/03/20 14:24  Progesterone <1.4 ng/mL <0.5 08/03/20 14:24  Testosterone 264 - 916 ng/dL <3 (L) 3/90/30 09:23  Testosterone Free 8.7 - 25.1 pg/mL 0.2 (L) 05/02/21 15:56  (L): Data is abnormally low   Latest Reference Range & Units 01/01/22 02:56  Hemoglobin A1C <5.7 % of total Hgb 6.1 (H)      ASSESSMENT / PLAN / RECOMMENDATIONS:   Female to Female Transgender    -Patient is not placed with the estrogen patches due to development of skin rash -We will switch to oral estradiol as below -We will repeat labs in 8 weeks to include estradiol and testosterone   Medications  Stop Climara patch Start estradiol 2 mg daily  2. Prediabetes :  -A1c 6.1% - Pt has made lifestyle changes with low carb diet and exercise  - I have recommended brisk walking ~150 minutes per week        Follow-up in 4 months     Signed electronically by: Lyndle Herrlich, MD  [facility]  [facility] [address] [place] [place], [place] [address] Phone: [phone] FAX: [phone]      CC: Marva Panda, NP [address] [place] [address] Phone: [phone]  Fax: [phone]   Return to Endocrinology clinic as below: Future Appointments  Date Time Provider Department Center  06/05/2022  2:20 PM Romney Compean, Konrad Dolores, MD [facility] None  06/18/2022  8:40 AM Cathey Endow, Scot Jun, DO [facility] None  06/20/2022 11:00 AM Daiva Eves, Lisette Grinder, MD [facility]

## 2022-06-18 ENCOUNTER — Ambulatory Visit (INDEPENDENT_AMBULATORY_CARE_PROVIDER_SITE_OTHER): Payer: 59 | Admitting: Family Medicine

## 2022-06-18 VITALS — BP 116/78 | HR 65 | Temp 98.4°F | Ht 75.0 in | Wt 249.0 lb

## 2022-06-18 DIAGNOSIS — Z6831 Body mass index (BMI) 31.0-31.9, adult: Secondary | ICD-10-CM

## 2022-06-18 DIAGNOSIS — E559 Vitamin D deficiency, unspecified: Secondary | ICD-10-CM | POA: Diagnosis not present

## 2022-06-18 DIAGNOSIS — F3289 Other specified depressive episodes: Secondary | ICD-10-CM | POA: Diagnosis not present

## 2022-06-18 DIAGNOSIS — E669 Obesity, unspecified: Secondary | ICD-10-CM

## 2022-06-18 DIAGNOSIS — R7303 Prediabetes: Secondary | ICD-10-CM | POA: Diagnosis not present

## 2022-06-18 MED ORDER — BUPROPION HCL ER (SR) 150 MG PO TB12: 150.0000 mg | ORAL_TABLET | Freq: Two times a day (BID) | ORAL | 0 refills | Status: DC

## 2022-06-18 MED ORDER — VITAMIN D (ERGOCALCIFEROL) 1.25 MG (50000 UNIT) PO CAPS: 50000.0000 [IU] | ORAL_CAPSULE | ORAL | 0 refills | Status: DC

## 2022-06-19 NOTE — Progress Notes (Signed)
Chief Complaint:   OBESITY Renee Malone (MR# [id number]) is a 36 y.o. adult who presents for evaluation and treatment of obesity and related comorbidities. Current BMI is Body mass index is 31.12 kg/m. Loraina has been struggling with her weight for many years and has been unsuccessful in either losing weight, maintaining weight loss, or reaching her healthy weight goal.  Micha is currently in the action stage of change and ready to dedicate time achieving and maintaining a healthier weight. Georgeanna is interested in becoming our patient and working on intensive lifestyle modifications including (but not limited to) diet and exercise for weight loss.  Kinzleigh's habits were reviewed today and are as follows: Her family eats meals together, she struggles with family and or coworkers weight loss sabotage, her desired weight loss is 34-49 lbs, she started gaining weight after her vaginoplasty in October 2021, her heaviest weight ever was 261 pounds, she is a picky eater and doesn't like to eat healthier foods, she has significant food cravings issues, she snacks frequently in the evenings, she skips meals frequently, she frequently makes poor food choices, she has problems with excessive hunger, she frequently eats larger portions than normal, and she struggles with emotional eating.  Depression Screen Brannon's Food and Mood (modified PHQ-9) score was 6.     06/04/2022    8:34 AM  Depression screen PHQ 2/9  Decreased Interest 0  Down, Depressed, Hopeless 0  PHQ - 2 Score 0  Altered sleeping 0  Tired, decreased energy 1  Change in appetite 3  Feeling bad or failure about yourself  0  Trouble concentrating 2  Moving slowly or fidgety/restless 0  Suicidal thoughts 0  PHQ-9 Score 6  Difficult doing work/chores Not difficult at all   Subjective:   1. Other fatigue Taegen admits to daytime somnolence and admits to waking up still tired. Patient has a history of  symptoms of daytime fatigue and morning fatigue. Landri generally gets 6 or 7 hours of sleep per night, and states that she has generally restful sleep. Snoring is present. Apneic episodes are not present. Epworth Sleepiness Score is 6.   2. SOBOE (shortness of breath on exertion) Janetta notes increasing shortness of breath with exercising and seems to be worsening over time with weight gain. She notes getting out of breath sooner with activity than she used to. This has not gotten worse recently. Season denies shortness of breath at rest or orthopnea.  3. Human immunodeficiency virus (HIV) disease (HCC) Roselina is stable on Biktarvy, and she is managed by Dr. Daiva Eves.  4. Prediabetes Jacara reports an A1c of 6.1.  She consumes excess sugar, and denies a family history of HI or type 2 diabetes melitis.  5. Gender dysphoria Shakthi is on estradiol 2 mg daily, which may be contributing to increase in body fat percentage.  She has not done resistance training to maintain muscle mass.  Assessment/Plan:   1. Other fatigue Tyrese does feel that her weight is causing her energy to be lower than it should be. Fatigue may be related to obesity, depression or many other causes. Labs will be ordered, and in the meanwhile, Elizabth will focus on self care including making healthy food choices, increasing physical activity and focusing on stress reduction.  - Vitamin B12 - CBC with Differential/Platelet - Comprehensive metabolic panel - Lipid panel - T4, free - TSH - VITAMIN D 25 Hydroxy (Vit-D Deficiency, Fractures)  2. SOBOE (shortness of breath on  exertion) Katena does feel that she gets out of breath more easily that she used to when she exercises. Naidelyn's shortness of breath appears to be obesity related and exercise induced. She has agreed to work on weight loss and gradually increase exercise to treat her exercise induced shortness of breath. Will continue to monitor  closely.  3. Human immunodeficiency virus (HIV) disease (HCC) Cheresa will continue her plan of care per Infectious Disease.  4. Prediabetes We will check labs today, and we will follow-up at Amaia's next visit.  - Hemoglobin A1c - Insulin, random  5. Gender dysphoria Eira will continue her plan of care per Endocrinology.  Recommended adding and some form of resistance training 3 times per week.  6. Depression screening Shannie had a positive depression screening. Depression is commonly associated with obesity and often results in emotional eating behaviors. We will monitor this closely and work on CBT to help improve the non-hunger eating patterns. Referral to Psychology may be required if no improvement is seen as she continues in our clinic.  7. Obesity, current BMI 31.2 Yannely is currently in the action stage of change and her goal is to continue with weight loss efforts. I recommend Pepper begin the structured treatment plan as follows:  She has agreed to the Category 2 Plan and keeping a food journal and adhering to recommended goals of 1400 calories and 120 grams of protein daily.  Exercise goals: Recommended the use of a smart watch.   Behavioral modification strategies: increasing lean protein intake, increasing vegetables, increasing water intake, decreasing eating out, no skipping meals, keeping healthy foods in the home, better snacking choices, and decreasing junk food.  She was informed of the importance of frequent follow-up visits to maximize her success with intensive lifestyle modifications for her multiple health conditions. She was informed we would discuss her lab results at her next visit unless there is a critical issue that needs to be addressed sooner. Lyndsy agreed to keep her next visit at the agreed upon time to discuss these results.  Objective:   Blood pressure 110/74, pulse (!) 55, temperature 97.9 F (36.6 C), height 6\' 3"  (1.905 m),  weight 249 lb (112.9 kg), SpO2 98 %. Body mass index is 31.12 kg/m.  EKG: Normal sinus rhythm, rate 71 BPM.  Indirect Calorimeter completed today shows a VO2 of 239 and a REE of 1642.  Her calculated basal metabolic rate is thus her basal metabolic rate is worse than expected.  General: Cooperative, alert, well developed, in no acute distress. HEENT: Conjunctivae and lids unremarkable. Cardiovascular: Regular rhythm.  Lungs: Normal work of breathing. Neurologic: No focal deficits.   Lab Results  Component Value Date   CREATININE 1.24 (H) 06/04/2022   BUN 13 06/04/2022   NA 140 06/04/2022   K 4.6 06/04/2022   CL 103 06/04/2022   CO2 22 06/04/2022   Lab Results  Component Value Date   ALT 17 06/04/2022   AST 19 06/04/2022   ALKPHOS 66 06/04/2022   BILITOT 0.2 06/04/2022   Lab Results  Component Value Date   HGBA1C 6.1 (H) 06/04/2022   HGBA1C 6.1 (H) 01/01/2022   Lab Results  Component Value Date   INSULIN 17.5 06/04/2022   Lab Results  Component Value Date   TSH 1.420 06/04/2022   Lab Results  Component Value Date   CHOL 194 06/04/2022   HDL 68 06/04/2022   LDLCALC 113 (H) 06/04/2022   TRIG 70 06/04/2022   CHOLHDL 2.9  06/04/2022   Lab Results  Component Value Date   WBC 4.6 06/04/2022   HGB 13.4 06/04/2022   HCT 39.6 06/04/2022   MCV 92 06/04/2022   PLT 259 06/04/2022   No results found for: "IRON", "TIBC", "FERRITIN"  Attestation Statements:   Reviewed by clinician on day of visit: allergies, medications, problem list, medical history, surgical history, family history, social history, and previous encounter notes.   Trude Mcburney, am acting as transcriptionist for Seymour Bars, DO.  I have reviewed the above documentation for accuracy and completeness, and I agree with the above. Glennis Brink, DO

## 2022-06-20 ENCOUNTER — Other Ambulatory Visit (HOSPITAL_COMMUNITY): Payer: Self-pay

## 2022-06-20 ENCOUNTER — Ambulatory Visit (INDEPENDENT_AMBULATORY_CARE_PROVIDER_SITE_OTHER): Payer: BC Managed Care – PPO | Admitting: Infectious Disease

## 2022-06-20 ENCOUNTER — Ambulatory Visit: Payer: BC Managed Care – PPO

## 2022-06-20 ENCOUNTER — Other Ambulatory Visit: Payer: Self-pay

## 2022-06-20 ENCOUNTER — Other Ambulatory Visit (HOSPITAL_COMMUNITY)
Admission: RE | Admit: 2022-06-20 | Discharge: 2022-06-20 | Disposition: A | Discharge status: Home / Self Care | Payer: BC Managed Care – PPO | Source: Ambulatory Visit | Attending: Infectious Disease | Admitting: Infectious Disease

## 2022-06-20 ENCOUNTER — Encounter: Payer: Self-pay | Admitting: Infectious Disease

## 2022-06-20 ENCOUNTER — Other Ambulatory Visit: Payer: Self-pay | Admitting: Infectious Disease

## 2022-06-20 VITALS — BP 123/84 | HR 68 | Resp 16 | Ht 75.0 in | Wt 255.0 lb

## 2022-06-20 DIAGNOSIS — R635 Abnormal weight gain: Secondary | ICD-10-CM

## 2022-06-20 DIAGNOSIS — R7303 Prediabetes: Secondary | ICD-10-CM

## 2022-06-20 DIAGNOSIS — Z789 Other specified health status: Secondary | ICD-10-CM

## 2022-06-20 DIAGNOSIS — Z7185 Encounter for immunization safety counseling: Secondary | ICD-10-CM

## 2022-06-20 DIAGNOSIS — B20 Human immunodeficiency virus [HIV] disease: Secondary | ICD-10-CM

## 2022-06-20 MED ORDER — BIKTARVY 50-200-25 MG PO TABS
1.0000 | ORAL_TABLET | Freq: Every day | ORAL | 11 refills | Status: DC
  Filled 2022-06-20 – 2022-07-26 (×2): qty 30, 30d supply, fill #0

## 2022-06-20 NOTE — Progress Notes (Signed)
Subjective:  Chief complaint: followup for HV disease on Biktarvy  Patient ID: Renee Malone, adult    DOB: Oct 24, 1985, 36 y.o.   MRN: [id number]  HPI  36 year old transgender Black woman with HIV that has perfectly controlled on Odefsey but had concerns about potentially missing doses and that reason we changed to [employer] with its higher barrier to resistance.  She is going to see surgery at [facility] but not wanting to have the types of plastic surgery that were mentioned in terms at least the harvest sites.  She had been struggling with weight gain and we have had warning about potential increase in weight gain with changed integrase strand transfer inhibitor.  She has tried to get placed on Ozempic and other drugs but her insurance is denied it saying that she has to have type 2 diabetes.  She has indeed prediabetic at this point time and has been seen now by endocrinology as well  We referred her to diet and weight management whom she saw quite recently.    Past Medical History:  Diagnosis Date   Anxiety    panic attacks   Elevated serum creatinine 06/10/2019   Heart murmur    HIV infection (HCC)    Lactose intolerance    Low hemoglobin 05/25/2020   Multiple food allergies    Prediabetes 05/25/2020   SOBOE (shortness of breath on exertion)    Weight gain 02/12/2022    Past Surgical History:  Procedure Laterality Date   BREAST ENHANCEMENT SURGERY Bilateral 02/25/2018   Procedure: MAMMOPLASTY AUGMENTATION WITH SILICONE IMPLANTS;  Surgeon: Glenna Fellows, MD;  Location: [facility];  Service: Plastics;  Laterality: Bilateral;   BREAST LUMPECTOMY Right 02/25/2018   Procedure: EXCISION RIGHT BREAST MASS;  Surgeon: Glenna Fellows, MD;  Location: [facility];  Service: Plastics;  Laterality: Right;   SKIN GRAFT      Family History  Problem Relation Age of Onset   Hypertension Mother    Depression Mother    Anxiety disorder  Mother    Bipolar disorder Mother    Schizophrenia Mother    Sleep apnea Mother    Drug abuse Mother    Obesity Mother    Hypertension Maternal Grandmother       Social History   Socioeconomic History   Marital status: Married    Spouse name: Not on file   Number of children: Not on file   Years of education: Not on file   Highest education level: Not on file  Occupational History   Occupation: CNA   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    Social Determinants of Health   Financial Resource Strain: Not on file  Food Insecurity: Not on file  Transportation Needs: Not on file  Physical Activity: Not on  file  Stress: Not on file  Social Connections: Not on file    Allergies  Allergen Reactions   Broccoli [Brassica Oleracea] Anaphylaxis   Other Anaphylaxis    Cauliflower     Current Outpatient Medications:    bictegravir-emtricitabine-tenofovir AF (BIKTARVY) 50-200-25 MG TABS tablet, Take 1 tablet by mouth daily., Disp: 30 tablet, Rfl: 11   buPROPion (WELLBUTRIN SR) 150 MG 12 hr tablet, Take 1 tablet (150 mg total) by mouth 2 (two) times daily., Disp: 60 tablet, Rfl: 0   estradiol (ESTRACE) 2 MG tablet, Take 1 tablet (2 mg total) by mouth daily., Disp: 90 tablet, Rfl: 1   hydrocortisone 1 % ointment, Apply 1 application topically 2 (two) times daily., Disp: 30 g, Rfl: 2   Vitamin D, Ergocalciferol, (DRISDOL) 1.25 MG (50000 UNIT) CAPS capsule,  Take 1 capsule (50,000 Units total) by mouth every 7 (seven) days., Disp: 5 capsule, Rfl: 0   Review of Systems  Constitutional:  Negative for activity change, appetite change, chills, diaphoresis, fatigue, fever and unexpected weight change.  HENT:  Negative for congestion, rhinorrhea, sinus pressure, sneezing, sore throat and trouble swallowing.   Eyes:  Negative for photophobia and visual disturbance.  Respiratory:  Negative for cough, chest tightness, shortness of breath, wheezing and stridor.   Cardiovascular:  Negative for chest pain, palpitations and leg swelling.  Gastrointestinal:  Negative for abdominal distention, abdominal pain, anal bleeding, blood in stool, constipation, diarrhea, nausea and vomiting.  Genitourinary:  Negative for difficulty urinating, dysuria, flank pain and hematuria.  Musculoskeletal:  Negative for arthralgias, back pain, gait problem, joint swelling and myalgias.  Skin:  Negative for color change, pallor, rash and wound.  Neurological:  Negative for dizziness, tremors, weakness and light-headedness.  Hematological:  Negative for adenopathy. Does not bruise/bleed easily.  Psychiatric/Behavioral:  Negative for agitation, behavioral problems, confusion, decreased concentration, dysphoric mood and sleep disturbance.        Objective:   Physical Exam Constitutional:      Appearance: She is well-developed.  HENT:     Head: Normocephalic and atraumatic.  Eyes:     Conjunctiva/sclera: Conjunctivae normal.  Cardiovascular:     Rate and Rhythm: Normal rate and regular rhythm.  Pulmonary:     Effort: Pulmonary effort is normal. No respiratory distress.     Breath sounds: No wheezing.  Abdominal:     General: There is no distension.     Palpations: Abdomen is soft.  Musculoskeletal:        General: No tenderness. Normal range of motion.     Cervical back: Normal range of motion and neck supple.  Skin:    General: Skin is warm and dry.     Coloration:  Skin is not pale.     Findings: No erythema or rash.  Neurological:     General: No focal deficit present.     Mental Status: She is alert and oriented to person, place, and time.  Psychiatric:        Mood and Affect: Mood normal.        Behavior: Behavior normal.        Thought Content: Thought content normal.        Judgment: Judgment normal.           Assessment & Plan:  HIV disease:  I will add order HIV viral load CD4 count CBC with differential CMP, RPR GC and chlamydia and I will continue  Renee Malone's Biktarvy  prescription which I done to  Renee Malone long now that she has [employer] gain obesity prediabetes: Continue work with [employer] management carbohydrate restriction  Transgender health: Following with [facility]  Seen counseling recommended flu shot annual plus updated COVID-19 vaccine

## 2022-06-21 LAB — T-HELPER CELLS (CD4) COUNT (NOT AT ARMC)
CD4 % Helper T Cell: 46 % (ref 33–65)
CD4 T Cell Abs: 888 /uL (ref 400–1790)

## 2022-06-21 LAB — URINE CYTOLOGY ANCILLARY ONLY
Chlamydia: NEGATIVE
Comment: NEGATIVE
Comment: NEGATIVE
Comment: NORMAL
Neisseria Gonorrhea: NEGATIVE
Trichomonas: NEGATIVE

## 2022-06-24 ENCOUNTER — Encounter (INDEPENDENT_AMBULATORY_CARE_PROVIDER_SITE_OTHER): Payer: Self-pay | Admitting: Family Medicine

## 2022-06-24 LAB — LIPID PANEL
Cholesterol: 169 mg/dL (ref <200)
HDL: 64 mg/dL (ref >=50)
LDL Cholesterol (Calc): 89 mg/dL (calc)
Non-HDL Cholesterol (Calc): 105 mg/dL (calc) (ref <130)
Total CHOL/HDL Ratio: 2.6 (calc) (ref <5.0)
Triglycerides: 75 mg/dL (ref <150)

## 2022-06-24 LAB — CBC WITH DIFFERENTIAL/PLATELET
Absolute Monocytes: 488 cells/uL (ref 200–950)
Basophils Absolute: 48 cells/uL (ref 0–200)
Basophils Relative: 0.9 %
Eosinophils Absolute: 122 cells/uL (ref 15–500)
Eosinophils Relative: 2.3 %
HCT: 38.8 % (ref 35.0–45.0)
Hemoglobin: 13.5 g/dL (ref 11.7–15.5)
Lymphs Abs: 2009 cells/uL (ref 850–3900)
MCH: 31.7 pg (ref 27.0–33.0)
MCHC: 34.8 g/dL (ref 32.0–36.0)
MCV: 91.1 fL (ref 80.0–100.0)
MPV: 10.7 fL (ref 7.5–12.5)
Monocytes Relative: 9.2 %
Neutro Abs: 2634 cells/uL (ref 1500–7800)
Neutrophils Relative %: 49.7 %
Platelets: 261 10*3/uL (ref 140–400)
RBC: 4.26 10*6/uL (ref 3.80–5.10)
RDW: 12.7 % (ref 11.0–15.0)
Total Lymphocyte: 37.9 %
WBC: 5.3 10*3/uL (ref 3.8–10.8)

## 2022-06-24 LAB — RPR: RPR Ser Ql: NONREACTIVE

## 2022-06-24 LAB — HIV-1 RNA QUANT-NO REFLEX-BLD
HIV 1 RNA Quant: NOT DETECTED Copies/mL
HIV-1 RNA Quant, Log: NOT DETECTED Log cps/mL

## 2022-06-24 NOTE — Progress Notes (Unsigned)
Chief Complaint:   OBESITY Renee Malone is here to discuss her progress with her obesity treatment plan along with follow-up of her obesity related diagnoses. Renee Malone is on Category 2 Plan and keeping a food journal and adhering to recommended goals of 1400 calories and 120 grams of protein daily.and states she is following her eating plan approximately 60% of the time. Renee Malone states she is some walking 30-60 minutes 1 time per week.  Today's visit was #: 2 Starting weight:  249 lbs Starting date: 06/04/2022 Today's weight: 249 lbs Today's date: 06/18/2022 Total lbs lost to date: 0 Total lbs lost since last in-office visit: 0  Interim History: eating all the meal on meal plan.  Has stopped sweet tea.  Cut back on fried foods.  Has increased stairs and walking.  Feels hungry and is having some cravings.  Stress has increased, has been struggling with emotional eating.   Subjective:   1. Vitamin D deficiency New diagnosis.  Discussed labs with patient today. Vitamin D 27.7.   On a multivitamin and calcium supplement.   2. Pre-diabetes Discussed labs with patient today. A1c 6.1, unchanged.  Has a positive family history fo T2DM.  Has decreased intake of carb and sugar.   3. Other depression, with emotional eating Works 2 jobs and has family stressors.  Contributes to emotional eating.    Assessment/Plan:   1. Vitamin D deficiency Begin - Vitamin D, Ergocalciferol, (DRISDOL) 1.25 MG (50000 UNIT) CAPS capsule; Take 1 capsule (50,000 Units total) by mouth every 7 (seven) days.  Dispense: 5 capsule; Refill: 0  2. Pre-diabetes Consider use of metformin or GLP-1 agonists.  Recheck in 3-4 months.   3. Other depression, with emotional eating Begin - buPROPion (WELLBUTRIN SR) 150 MG 12 hr tablet; Take 1 tablet (150 mg total) by mouth 2 (two) times daily.  Dispense: 60 tablet; Refill: 0  4. Obesity, current BMI 31.1 Renee Malone is currently in the action stage of change. As such, her  goal is to continue with weight loss efforts. She has agreed to the Category 2 Plan and keeping a food journal and adhering to recommended goals of 1400 calories and 120 protein.   Exercise goals:  as is.   Behavioral modification strategies: increasing lean protein intake, increasing vegetables, increasing water intake, decreasing eating out, no skipping meals, better snacking choices, and keeping a strict food journal.  Renee Malone has agreed to follow-up with our clinic in 3 weeks. She was informed of the importance of frequent follow-up visits to maximize her success with intensive lifestyle modifications for her multiple health conditions.   Objective:   Blood pressure 116/78, pulse 65, temperature 98.4 F (36.9 C), height 6\' 3"  (1.905 m), weight 249 lb (112.9 kg), SpO2 100 %. Body mass index is 31.12 kg/m.  General: Cooperative, alert, well developed, in no acute distress. HEENT: Conjunctivae and lids unremarkable. Cardiovascular: Regular rhythm.  Lungs: Normal work of breathing. Neurologic: No focal deficits.   Lab Results  Component Value Date   CREATININE 1.24 (H) 06/04/2022   BUN 13 06/04/2022   NA 140 06/04/2022   K 4.6 06/04/2022   CL 103 06/04/2022   CO2 22 06/04/2022   Lab Results  Component Value Date   ALT 17 06/04/2022   AST 19 06/04/2022   ALKPHOS 66 06/04/2022   BILITOT 0.2 06/04/2022   Lab Results  Component Value Date   HGBA1C 6.1 (H) 06/04/2022   HGBA1C 6.1 (H) 01/01/2022   Lab Results  Component Value Date   INSULIN 17.5 06/04/2022   Lab Results  Component Value Date   TSH 1.420 06/04/2022   Lab Results  Component Value Date   CHOL 169 06/20/2022   HDL 64 06/20/2022   LDLCALC 89 06/20/2022   TRIG 75 06/20/2022   CHOLHDL 2.6 06/20/2022   Lab Results  Component Value Date   VD25OH 27.7 (L) 06/04/2022   Lab Results  Component Value Date   WBC 5.3 06/20/2022   HGB 13.5 06/20/2022   HCT 38.8 06/20/2022   MCV 91.1 06/20/2022   PLT 261  06/20/2022   No results found for: "IRON", "TIBC", "FERRITIN"  Attestation Statements:   Reviewed by clinician on day of visit: allergies, medications, problem list, medical history, surgical history, family history, social history, and previous encounter notes.  I, Davy Pique, am acting as Location manager for Loyal Gambler, DO.  I have reviewed the above documentation for accuracy and completeness, and I agree with the above. Dell Ponto, DO

## 2022-07-10 ENCOUNTER — Ambulatory Visit (INDEPENDENT_AMBULATORY_CARE_PROVIDER_SITE_OTHER): Payer: 59 | Admitting: Family Medicine

## 2022-07-10 ENCOUNTER — Encounter (INDEPENDENT_AMBULATORY_CARE_PROVIDER_SITE_OTHER): Payer: Self-pay | Admitting: Family Medicine

## 2022-07-10 VITALS — BP 113/79 | HR 74 | Temp 98.4°F | Ht 75.0 in | Wt 243.0 lb

## 2022-07-10 DIAGNOSIS — F3289 Other specified depressive episodes: Secondary | ICD-10-CM

## 2022-07-10 DIAGNOSIS — R7303 Prediabetes: Secondary | ICD-10-CM

## 2022-07-10 DIAGNOSIS — E559 Vitamin D deficiency, unspecified: Secondary | ICD-10-CM

## 2022-07-10 DIAGNOSIS — Z6830 Body mass index (BMI) 30.0-30.9, adult: Secondary | ICD-10-CM

## 2022-07-10 DIAGNOSIS — E669 Obesity, unspecified: Secondary | ICD-10-CM | POA: Diagnosis not present

## 2022-07-10 MED ORDER — VITAMIN D (ERGOCALCIFEROL) 1.25 MG (50000 UNIT) PO CAPS: 50000.0000 [IU] | ORAL_CAPSULE | ORAL | 0 refills | Status: DC

## 2022-07-10 MED ORDER — BUPROPION HCL ER (SR) 150 MG PO TB12: 150.0000 mg | ORAL_TABLET | Freq: Two times a day (BID) | ORAL | 0 refills | Status: DC

## 2022-07-18 NOTE — Progress Notes (Signed)
Chief Complaint:   OBESITY Renee Malone is here to discuss her progress with her obesity treatment plan along with follow-up of her obesity related diagnoses. Renee Malone is on Category 2 Plan and keeping a food journal and adhering to recommended goals of 1400 calories and 120 protein daily and states she is following her eating plan approximately 80-85% of the time. Renee Malone states she is not exercising.   Today's visit was #: 3 Starting weight: 249 lbs Starting date: 06/04/2022 Today's weight: 243 lbs Today's date: 07/10/2022 Total lbs lost to date: 6 lbs Total lbs lost since last in-office visit: 6 lbs  Interim History: She has been walking more, she is still working 2 jobs.  She is eating 2 meals out most days per week, but counting calories.  Mindful of food choices and portions  Subjective:   1. Vitamin D deficiency She is on prescription Vitamin D 50,000 IU weekly.   2. Prediabetes Last A1c was 6.1, unchanged.  Fair compliance, reducing sugar intake.   3. Other depression, with emotional eating She is on Wellbutrin SR 150 mg BID, it is helping with some cravings.  Has some irritability, but improving over time.   Assessment/Plan:   1. Vitamin D deficiency Continue and refill - Vitamin D, Ergocalciferol, (DRISDOL) 1.25 MG (50000 UNIT) CAPS capsule; Take 1 capsule (50,000 Units total) by mouth every 7 (seven) days.  Dispense: 5 capsule; Refill: 0  2. Prediabetes Consider use of Metformin, GLP-1 agonist once available.   3. Other depression, with emotional eating Continue and refill - buPROPion (WELLBUTRIN SR) 150 MG 12 hr tablet; Take 1 tablet (150 mg total) by mouth 2 (two) times daily.  Dispense: 60 tablet; Refill: 0  4. Obesity, current BMI 30.4 Renee Malone is currently in the action stage of change. As such, her goal is to continue with weight loss efforts. She has agreed to keeping a food journal and adhering to recommended goals of 1500 calories and 120 protein daily.     Exercise goals:  recommend both cardio and resistance training, 3-5 times per week.   Behavioral modification strategies: increasing lean protein intake, increasing vegetables, increasing water intake, increasing high fiber foods, decreasing eating out, no skipping meals, meal planning and cooking strategies, better snacking choices, and decreasing junk food.  Renee Malone has agreed to follow-up with our clinic in 3-4 weeks. She was informed of the importance of frequent follow-up visits to maximize her success with intensive lifestyle modifications for her multiple health conditions.   Objective:   Blood pressure 113/79, pulse 74, temperature 98.4 F (36.9 C), height 6\' 3"  (1.905 m), weight 243 lb (110.2 kg), SpO2 98 %. Body mass index is 30.37 kg/m.  General: Cooperative, alert, well developed, in no acute distress. HEENT: Conjunctivae and lids unremarkable. Cardiovascular: Regular rhythm.  Lungs: Normal work of breathing. Neurologic: No focal deficits.   Lab Results  Component Value Date   CREATININE 1.24 (H) 06/04/2022   BUN 13 06/04/2022   NA 140 06/04/2022   K 4.6 06/04/2022   CL 103 06/04/2022   CO2 22 06/04/2022   Lab Results  Component Value Date   ALT 17 06/04/2022   AST 19 06/04/2022   ALKPHOS 66 06/04/2022   BILITOT 0.2 06/04/2022   Lab Results  Component Value Date   HGBA1C 6.1 (H) 06/04/2022   HGBA1C 6.1 (H) 01/01/2022   Lab Results  Component Value Date   INSULIN 17.5 06/04/2022   Lab Results  Component Value Date  TSH 1.420 06/04/2022   Lab Results  Component Value Date   CHOL 169 06/20/2022   HDL 64 06/20/2022   LDLCALC 89 06/20/2022   TRIG 75 06/20/2022   CHOLHDL 2.6 06/20/2022   Lab Results  Component Value Date   VD25OH 27.7 (L) 06/04/2022   Lab Results  Component Value Date   WBC 5.3 06/20/2022   HGB 13.5 06/20/2022   HCT 38.8 06/20/2022   MCV 91.1 06/20/2022   PLT 261 06/20/2022   No results found for: "IRON", "TIBC",  "FERRITIN"  Attestation Statements:   Reviewed by clinician on day of visit: allergies, medications, problem list, medical history, surgical history, family history, social history, and previous encounter notes.  I, Malcolm Metro, am acting as Energy manager for Renee Bars, DO.  I have reviewed the above documentation for accuracy and completeness, and I agree with the above. Glennis Brink, DO

## 2022-07-22 ENCOUNTER — Encounter (HOSPITAL_BASED_OUTPATIENT_CLINIC_OR_DEPARTMENT_OTHER): Payer: Self-pay

## 2022-07-22 ENCOUNTER — Emergency Department (HOSPITAL_BASED_OUTPATIENT_CLINIC_OR_DEPARTMENT_OTHER)
Admission: EM | Admit: 2022-07-22 | Discharge: 2022-07-22 | Disposition: A | Discharge status: Home / Self Care | Payer: BC Managed Care – PPO | Attending: Emergency Medicine | Admitting: Emergency Medicine

## 2022-07-22 ENCOUNTER — Other Ambulatory Visit: Payer: Self-pay

## 2022-07-22 DIAGNOSIS — Z21 Asymptomatic human immunodeficiency virus [HIV] infection status: Secondary | ICD-10-CM | POA: Diagnosis not present

## 2022-07-22 DIAGNOSIS — K0889 Other specified disorders of teeth and supporting structures: Secondary | ICD-10-CM | POA: Insufficient documentation

## 2022-07-22 DIAGNOSIS — R220 Localized swelling, mass and lump, head: Secondary | ICD-10-CM | POA: Diagnosis present

## 2022-07-22 LAB — CBC WITH DIFFERENTIAL/PLATELET
Abs Immature Granulocytes: 0.02 10*3/uL (ref 0.00–0.07)
Basophils Absolute: 0 10*3/uL (ref 0.0–0.1)
Basophils Relative: 1 %
Eosinophils Absolute: 0.1 10*3/uL (ref 0.0–0.5)
Eosinophils Relative: 1 %
HCT: 36.4 % (ref 36.0–46.0)
Hemoglobin: 12.5 g/dL (ref 12.0–15.0)
Immature Granulocytes: 0 %
Lymphocytes Relative: 19 %
Lymphs Abs: 1.6 10*3/uL (ref 0.7–4.0)
MCH: 31.5 pg (ref 26.0–34.0)
MCHC: 34.3 g/dL (ref 30.0–36.0)
MCV: 91.7 fL (ref 80.0–100.0)
Monocytes Absolute: 0.7 10*3/uL (ref 0.1–1.0)
Monocytes Relative: 9 %
Neutro Abs: 5.9 10*3/uL (ref 1.7–7.7)
Neutrophils Relative %: 70 %
Platelets: 272 10*3/uL (ref 150–400)
RBC: 3.97 MIL/uL (ref 3.87–5.11)
RDW: 12.4 % (ref 11.5–15.5)
WBC: 8.4 10*3/uL (ref 4.0–10.5)
nRBC: 0 % (ref 0.0–0.2)

## 2022-07-22 LAB — BASIC METABOLIC PANEL
Anion gap: 5 (ref 5–15)
BUN: 9 mg/dL (ref 6–20)
CO2: 28 mmol/L (ref 22–32)
Calcium: 8.9 mg/dL (ref 8.9–10.3)
Chloride: 107 mmol/L (ref 98–111)
Creatinine, Ser: 1.18 mg/dL — ABNORMAL HIGH (ref 0.44–1.00)
GFR, Estimated: >60 mL/min (ref >60)
Glucose, Bld: 116 mg/dL — ABNORMAL HIGH (ref 70–99)
Potassium: 3.5 mmol/L (ref 3.5–5.1)
Sodium: 140 mmol/L (ref 135–145)

## 2022-07-22 MED ORDER — FLUCONAZOLE 150 MG PO TABS: 150.0000 mg | ORAL_TABLET | Freq: Every day | ORAL | 0 refills | Status: AC

## 2022-07-22 MED ORDER — AMOXICILLIN-POT CLAVULANATE 875-125 MG PO TABS: 1.0000 | ORAL_TABLET | Freq: Two times a day (BID) | ORAL | 0 refills | Status: AC

## 2022-07-22 NOTE — ED Provider Notes (Signed)
[facility] EMERGENCY DEPARTMENT Provider Note   CSN: [id number] Arrival date & time: 07/22/22  1618     History HIV Chief Complaint  Patient presents with   Dental Problem    Renee Malone is a 36 y.o. adult.  36 year old female with a past medical history of HIV presents to the ED with a chief complaint of left facial swelling which began yesterday.  Patient reports she had all her wisdom teeth extracted approximately 4 days ago, reports the swelling was worse, then later it improved.  She now endorses swelling that she fears is worse, complaining of worsening left lower jaw pain after the procedure.  She has been taking her ibuprofen which was covering for the pain however reports she also had to take some hydrocodone as the pain was so severe.  He is in good contact with her dentist and is going to be evaluated tomorrow morning by then.  She has not been having any fevers at home, she is concerned for infection.  He is tolerating her secretions well,no fever or other complaints.   The history is provided by the patient.       Home Medications Prior to Admission medications   Medication Sig Start Date End Date Taking? Authorizing Provider  amoxicillin-clavulanate (AUGMENTIN) 875-125 MG tablet Take 1 tablet by mouth every 12 (twelve) hours for 7 days. 07/22/22 07/29/22 Yes Remmington Urieta, PA-C  fluconazole (DIFLUCAN) 150 MG tablet Take 1 tablet (150 mg total) by mouth daily for 1 day. 07/22/22 07/23/22 Yes Lavarr President, Leonie Douglas, PA-C  ALPRAZolam (XANAX) 1 MG tablet TAKE ONE TABLET BY MOUTH EVERY 8 HOURS AS NEEDED Patient not taking: No sig reported 01/10/12   Cliffton Asters, MD  bictegravir-emtricitabine-tenofovir AF (BIKTARVY) 50-200-25 MG TABS tablet Take 1 tablet by mouth daily. 06/20/22   Randall Hiss, MD  buPROPion [facility] SR) 150 MG 12 hr tablet Take 1 tablet (150 mg total) by mouth 2 (two) times daily. 07/10/22   Bowen, Scot Jun, DO  estradiol (ESTRACE) 2 MG  tablet Take 1 tablet (2 mg total) by mouth daily. 01/29/22   Shamleffer, Konrad Dolores, MD  hydrocortisone 1 % ointment Apply 1 application topically 2 (two) times daily. 05/27/19   Randall Hiss, MD  Vitamin D, Ergocalciferol, (DRISDOL) 1.25 MG (50000 UNIT) CAPS capsule Take 1 capsule (50,000 Units total) by mouth every 7 (seven) days. 07/10/22   Bowen, Scot Jun, DO      Allergies    Broccoli [place] oleracea] and Other    Review of Systems   Review of Systems  Constitutional:  Negative for fever.  HENT:  Positive for dental problem.   All other systems reviewed and are negative.   Physical Exam Updated Vital Signs BP 138/89 (BP Location: Right Arm)   Pulse 77   Temp 99.3 F (37.4 C) (Oral)   Resp 16   Ht 6\' 3"  (1.905 m)   Wt 110.2 kg   SpO2 100%   BMI 30.37 kg/m  Physical Exam Vitals and nursing note reviewed.  Constitutional:      Appearance: Normal appearance.  HENT:     Head: Normocephalic and atraumatic.     Mouth/Throat:     Mouth: Mucous membranes are dry.     Dentition: No dental tenderness, dental caries or dental abscesses.     Tongue: No lesions.     Palate: No mass.     Pharynx: Oropharynx is clear. Uvula midline. No oropharyngeal exudate.  Tonsils: 0 on the right. 0 on the left.     Comments: No dental abscess noted, swelling noted to the left lower jaw tender to palpation however no surrounding erythema or fluctuance. Eyes:     Pupils: Pupils are equal, round, and reactive to light.  Cardiovascular:     Rate and Rhythm: Normal rate.  Pulmonary:     Effort: Pulmonary effort is normal.  Abdominal:     General: Abdomen is flat.  Musculoskeletal:     Cervical back: Normal range of motion and neck supple.  Skin:    General: Skin is warm and dry.  Neurological:     Mental Status: She is alert and oriented to person, place, and time.     ED Results / Procedures / Treatments   Labs (all labs ordered are listed, but only abnormal results are  displayed) Labs Reviewed  BASIC METABOLIC PANEL - Abnormal; Notable for the following components:      Result Value   Glucose, Bld 116 (*)    Creatinine, Ser 1.18 (*)    All other components within normal limits  CBC WITH DIFFERENTIAL/PLATELET    EKG None  Radiology No results found.  Procedures Procedures    Medications Ordered in ED Medications - No data to display  ED Course/ Medical Decision Making/ A&P                           Medical Decision Making Amount and/or Complexity of Data Reviewed Labs: ordered.    Here with a chief complaint of dental pain which began approximately yesterday.  She did have her wisdom teeth taken out approximately 5 days ago, reports there was swelling to the area which improved however has not returned.  She has been taking ibuprofen, hydrocodone without any improvement in symptoms.  Arrived in the ED with stable vital signs perhaps a low-grade temp of 99.3.  Oropharynx is clear with uvula midline, no signs of PTA noted.  Perhaps swelling on the inside noted for particular abscess however no pus or drainage noted around the area.  She is tolerating her secretions adequately, no shortness of breath or chest pain.  I do not have suspicion for a deep space infection at this time.  She is able to rotate her neck left to right.  And is being evaluated by dentist tomorrow morning, I did discuss placing her on a short course of antibiotics to help cover for infection and will follow-up with this at 730 tomorrow.  Patient does report she gets yeast infections after starting antibiotics, therefore also given an additional prescription for Diflucan to take after completion of antibiotics.  Patient is hemodynamically stable for discharge.    Portions of this note were generated with [employer]. Dictation errors may occur despite best attempts at proofreading.   Final Clinical Impression(s) / ED Diagnoses Final diagnoses:  Pain, dental     Rx / DC Orders ED Discharge Orders          Ordered    amoxicillin-clavulanate (AUGMENTIN) 875-125 MG tablet  Every 12 hours        07/22/22 1914    fluconazole (DIFLUCAN) 150 MG tablet  Daily        07/22/22 1916              Janeece Fitting, Hershal Coria 07/22/22 1916    Fredia Sorrow, MD 07/23/22 [id number]

## 2022-07-22 NOTE — ED Triage Notes (Addendum)
Last Tuesday patient had all wisdom teeth removed and now having swelling to left lower jaw.  Patient has notable swelling to left side of face.  Denies fever.

## 2022-07-22 NOTE — Discharge Instructions (Signed)
You are prescribed antibiotics on today's visit to help cover for a dental infection, please take 1 tablet twice a day for the next 7 days to help with infection.  The second medication is a medication that you requested such as Diflucan to help treat yeast infections.  Please go see your dentist tomorrow morning.

## 2022-07-26 ENCOUNTER — Other Ambulatory Visit (HOSPITAL_COMMUNITY): Payer: Self-pay

## 2022-07-31 ENCOUNTER — Other Ambulatory Visit: Payer: Self-pay | Admitting: Internal Medicine

## 2022-08-14 ENCOUNTER — Ambulatory Visit (INDEPENDENT_AMBULATORY_CARE_PROVIDER_SITE_OTHER): Payer: BC Managed Care – PPO | Admitting: Family Medicine

## 2022-08-14 ENCOUNTER — Encounter: Payer: Self-pay | Admitting: Internal Medicine

## 2022-08-14 ENCOUNTER — Encounter (INDEPENDENT_AMBULATORY_CARE_PROVIDER_SITE_OTHER): Payer: Self-pay | Admitting: Family Medicine

## 2022-08-14 ENCOUNTER — Other Ambulatory Visit (HOSPITAL_COMMUNITY): Payer: Self-pay

## 2022-08-14 VITALS — BP 117/86 | HR 70 | Temp 98.5°F | Ht 75.0 in | Wt 238.0 lb

## 2022-08-14 DIAGNOSIS — R7303 Prediabetes: Secondary | ICD-10-CM | POA: Diagnosis not present

## 2022-08-14 DIAGNOSIS — E669 Obesity, unspecified: Secondary | ICD-10-CM

## 2022-08-14 DIAGNOSIS — F3289 Other specified depressive episodes: Secondary | ICD-10-CM | POA: Diagnosis not present

## 2022-08-14 DIAGNOSIS — Z6829 Body mass index (BMI) 29.0-29.9, adult: Secondary | ICD-10-CM

## 2022-08-14 DIAGNOSIS — E559 Vitamin D deficiency, unspecified: Secondary | ICD-10-CM | POA: Diagnosis not present

## 2022-08-14 MED ORDER — OZEMPIC (0.25 OR 0.5 MG/DOSE) 2 MG/3ML ~~LOC~~ SOPN: 0.2500 mg | PEN_INJECTOR | SUBCUTANEOUS | 0 refills | Status: DC

## 2022-08-14 MED ORDER — VITAMIN D (ERGOCALCIFEROL) 1.25 MG (50000 UNIT) PO CAPS: 50000.0000 [IU] | ORAL_CAPSULE | ORAL | 0 refills | Status: DC

## 2022-08-14 MED ORDER — BUPROPION HCL ER (XL) 300 MG PO TB24: 300.0000 mg | ORAL_TABLET | Freq: Every day | ORAL | 0 refills | Status: DC

## 2022-08-15 ENCOUNTER — Other Ambulatory Visit: Payer: Self-pay | Admitting: Internal Medicine

## 2022-08-15 ENCOUNTER — Telehealth (INDEPENDENT_AMBULATORY_CARE_PROVIDER_SITE_OTHER): Payer: Self-pay | Admitting: *Deleted

## 2022-08-15 MED ORDER — ESTRADIOL 2 MG PO TABS: 2.0000 mg | ORAL_TABLET | Freq: Every day | ORAL | 1 refills | Status: DC

## 2022-08-15 NOTE — Telephone Encounter (Signed)
Prior authorization done via cover my meds. Waiting on determination.  Renee Malone (Key: [id number]) Ozempic (0.25 or 0.5 MG/DOSE) 2MG /3ML pen-injectors

## 2022-08-20 NOTE — Telephone Encounter (Signed)
Prior authorization for Ozempic done through patients [employer]. Waiting on determination. Renee Malone (Key: [id number]) Ozempic (0.25 or 0.5 MG/DOSE) 2MG /3ML pen-injectors

## 2022-08-23 NOTE — Progress Notes (Signed)
Chief Complaint:   OBESITY Renee Malone is here to discuss her progress with her obesity treatment plan along with follow-up of her obesity related diagnoses. Ha is on keeping a food journal and adhering to recommended goals of 1500 calories and 120 protein and states she is following her eating plan approximately 50% of the time. Renee Malone states she is not exercising.   Today's visit was #: 4 Starting weight: 249 lbs Starting date: 06/04/2022 Today's weight: 238 lbs Today's date: 08/14/2022 Total lbs lost to date: 11 lbs Total lbs lost since last in-office visit: 5 lbs  Interim History: She had 4 wisdom teeth removed in October.  She admits to getting off track but has stayed off SSB's.  She craves sweets but is trying to get better meals in.  Trying to keep sweets out of the house.  She works two jobs.   Subjective:   1. Vitamin D deficiency She is currently taking prescription vitamin D 50,000 IU each week. She denies nausea, vomiting or muscle weakness. Energy level improving.  Last Vitamin D level 27.7, 06/04/22.  2. Pre-diabetes There was drug interaction between metformin and Biktarvy. She has been craving sugar with insulin resistance. She is lacking regular exercise.   3. Other depression, with emotional eating She is on Wellbutrin SR 150 mg BID, it initially helped more but stress levels are higher and cravings have increased.  Assessment/Plan:   1. Vitamin D deficiency Refill - Vitamin D, Ergocalciferol, (DRISDOL) 1.25 MG (50000 UNIT) CAPS capsule; Take 1 capsule (50,000 Units total) by mouth every 7 (seven) days.  Dispense: 5 capsule; Refill: 0  2. Pre-diabetes Patient denies a personal or family history of pancreatitis, medullary thyroid carcinoma or multiple endocrine neoplasia type II. Recommend reviewing pen training video online.  Begin - Semaglutide,0.25 or 0.5MG /DOS, (OZEMPIC, 0.25 OR 0.5 MG/DOSE,) 2 MG/3ML SOPN; Inject 0.25 mg into the skin once a week.   Dispense: 3 mL; Refill: 0  3. Other depression, with emotional eating Change - buPROPion (WELLBUTRIN XL) 300 MG 24 hr tablet; Take 1 tablet (300 mg total) by mouth daily.  Dispense: 30 tablet; Refill: 0  4. Obesity,current BMI 29.8 1) Focus on lean protein and fiber with meals and limiting snacks to 2 per day.   2) Log food intake for at least 5 days per week.  3) Try short workouts and tracking steps is recommended.   [name] is currently in the action stage of change. As such, her goal is to continue with weight loss efforts. She has agreed to keeping a food journal and adhering to recommended goals of 1500 calories and 120 protein daily.   Exercise goals: All adults should avoid inactivity. Some physical activity is better than none, and adults who participate in any amount of physical activity gain some health benefits.  Behavioral modification strategies: increasing lean protein intake, increasing water intake, decreasing liquid calories, increasing high fiber foods, decreasing eating out, no skipping meals, meal planning and cooking strategies, keeping healthy foods in the home, and keeping a strict food journal.  Katheleen has agreed to follow-up with our clinic in 3 weeks. She was informed of the importance of frequent follow-up visits to maximize her success with intensive lifestyle modifications for her multiple health conditions.   Objective:   Blood pressure 117/86, pulse 70, temperature 98.5 F (36.9 C), height 6\' 3"  (1.905 m), weight 238 lb (108 kg), SpO2 98 %. Body mass index is 29.75 kg/m.  General: Cooperative, alert, well developed,  in no acute distress. HEENT: Conjunctivae and lids unremarkable. Cardiovascular: Regular rhythm.  Lungs: Normal work of breathing. Neurologic: No focal deficits.   Lab Results  Component Value Date   CREATININE 1.18 (H) 07/22/2022   BUN 9 07/22/2022   NA 140 07/22/2022   K 3.5 07/22/2022   CL 107 07/22/2022   CO2 28 07/22/2022    Lab Results  Component Value Date   ALT 17 06/04/2022   AST 19 06/04/2022   ALKPHOS 66 06/04/2022   BILITOT 0.2 06/04/2022   Lab Results  Component Value Date   HGBA1C 6.1 (H) 06/04/2022   HGBA1C 6.1 (H) 01/01/2022   Lab Results  Component Value Date   INSULIN 17.5 06/04/2022   Lab Results  Component Value Date   TSH 1.420 06/04/2022   Lab Results  Component Value Date   CHOL 169 06/20/2022   HDL 64 06/20/2022   LDLCALC 89 06/20/2022   TRIG 75 06/20/2022   CHOLHDL 2.6 06/20/2022   Lab Results  Component Value Date   VD25OH 27.7 (L) 06/04/2022   Lab Results  Component Value Date   WBC 8.4 07/22/2022   HGB 12.5 07/22/2022   HCT 36.4 07/22/2022   MCV 91.7 07/22/2022   PLT 272 07/22/2022   No results found for: "IRON", "TIBC", "FERRITIN"  Attestation Statements:   Reviewed by clinician on day of visit: allergies, medications, problem list, medical history, surgical history, family history, social history, and previous encounter notes.  I have personally spent 40 minutes total time today in preparation, patient care, and documentation for this visit, including the following: review of clinical lab tests; review of medical tests/procedures/services.    I, Malcolm Metro, am acting as Energy manager for Seymour Bars, DO.  I have reviewed the above documentation for accuracy and completeness, and I agree with the above. Glennis Brink, DO

## 2022-08-31 ENCOUNTER — Other Ambulatory Visit (HOSPITAL_COMMUNITY): Payer: Self-pay

## 2022-09-04 ENCOUNTER — Other Ambulatory Visit (HOSPITAL_COMMUNITY): Payer: Self-pay

## 2022-09-11 ENCOUNTER — Telehealth (INDEPENDENT_AMBULATORY_CARE_PROVIDER_SITE_OTHER): Payer: Self-pay | Admitting: *Deleted

## 2022-09-11 ENCOUNTER — Encounter (INDEPENDENT_AMBULATORY_CARE_PROVIDER_SITE_OTHER): Payer: Self-pay | Admitting: Family Medicine

## 2022-09-11 ENCOUNTER — Ambulatory Visit (INDEPENDENT_AMBULATORY_CARE_PROVIDER_SITE_OTHER): Payer: BC Managed Care – PPO | Admitting: Family Medicine

## 2022-09-11 VITALS — BP 116/79 | HR 61 | Temp 98.5°F | Ht 75.0 in | Wt 243.0 lb

## 2022-09-11 DIAGNOSIS — E669 Obesity, unspecified: Secondary | ICD-10-CM

## 2022-09-11 DIAGNOSIS — E559 Vitamin D deficiency, unspecified: Secondary | ICD-10-CM | POA: Diagnosis not present

## 2022-09-11 DIAGNOSIS — Z6830 Body mass index (BMI) 30.0-30.9, adult: Secondary | ICD-10-CM

## 2022-09-11 DIAGNOSIS — R7303 Prediabetes: Secondary | ICD-10-CM

## 2022-09-11 DIAGNOSIS — F3289 Other specified depressive episodes: Secondary | ICD-10-CM | POA: Diagnosis not present

## 2022-09-11 MED ORDER — VITAMIN D (ERGOCALCIFEROL) 1.25 MG (50000 UNIT) PO CAPS: 50000.0000 [IU] | ORAL_CAPSULE | ORAL | 0 refills | Status: DC

## 2022-09-11 MED ORDER — BUPROPION HCL ER (XL) 300 MG PO TB24: 300.0000 mg | ORAL_TABLET | Freq: Every day | ORAL | 0 refills | Status: DC

## 2022-09-11 NOTE — Telephone Encounter (Signed)
PA FOR OZEMPIC APPROVED  Effective from 08/20/2022 through 08/19/2023.

## 2022-09-12 ENCOUNTER — Other Ambulatory Visit (INDEPENDENT_AMBULATORY_CARE_PROVIDER_SITE_OTHER): Payer: Self-pay | Admitting: Family Medicine

## 2022-09-12 DIAGNOSIS — F3289 Other specified depressive episodes: Secondary | ICD-10-CM

## 2022-09-17 ENCOUNTER — Other Ambulatory Visit (INDEPENDENT_AMBULATORY_CARE_PROVIDER_SITE_OTHER): Payer: Self-pay | Admitting: Family Medicine

## 2022-09-17 ENCOUNTER — Telehealth (INDEPENDENT_AMBULATORY_CARE_PROVIDER_SITE_OTHER): Payer: Self-pay | Admitting: *Deleted

## 2022-09-17 DIAGNOSIS — E559 Vitamin D deficiency, unspecified: Secondary | ICD-10-CM

## 2022-09-17 NOTE — Telephone Encounter (Signed)
Renee Malone (Key: [id number]) Ozempic (0.25 or 0.5 MG/DOSE) 2MG /3ML pen-injectors New PA done via cover my meds for her Ozempic through her [employer] insurance.

## 2022-09-19 NOTE — Progress Notes (Signed)
Chief Complaint:   OBESITY Renee Malone is here to discuss her progress with her obesity treatment plan along with follow-up of her obesity related diagnoses. Octivia is on keeping a food journal and adhering to recommended goals of 1500 calories and 120 protein and states she is following her eating plan approximately 70% of the time. [name] states she is walking 30 minutes 3 times per week.  Today's visit was #: 5 Starting weight: 249 LBS Starting date: 06/04/2022 Today's weight: 243 LBS Today's date: 09/11/2022 Total lbs lost to date: 6 LBS Total lbs lost since last in-office visit: +5 LBS  Interim History: Patient has been through some stressors.  Smart watch and plans to track steps.  She has been practicing mindful eating.  Eating more baking and.  She works 2 jobs.  She admits to lack of meal planning and not logging.  Subjective:   1. Pre-diabetes 06/04/2022, A1c was 6.1.  Patient cannot take metformin due to drug interaction with Biktarvy.  2. Vitamin D deficiency 06/04/2022, vitamin D level was 27.7.  She is on prescription vitamin D 50,000 IU weekly.  3. Other depression, with emotional eating Change Wellbutrin SR to XL 300 mg daily.  Mood is stable.  She denies adverse side effects.  Assessment/Plan:   1. Pre-diabetes Begin Ozempic 0.25 mg weekly, patient has prescription at the pharmacy to begin.  Recheck A1c at next visit.  2. Vitamin D deficiency Recheck vitamin D level next visit.  Refill- Vitamin D, Ergocalciferol, (DRISDOL) 1.25 MG (50000 UNIT) CAPS capsule; Take 1 capsule (50,000 Units total) by mouth every 7 (seven) days.  Dispense: 5 capsule; Refill: 0  3. Other depression, with emotional eating Start- buPROPion (WELLBUTRIN XL) 300 MG 24 hr tablet; Take 1 tablet (300 mg total) by mouth daily.  Dispense: 30 tablet; Refill: 0  4. Obesity,current BMI 30.4 Plans to resume meal plan next visit if needed.  Renee Malone is currently in the action stage of  change. As such, her goal is to continue with weight loss efforts. She has agreed to keeping a food journal and adhering to recommended goals of 1500 calories and 120 protein daily.  Exercise goals:  Track steps.  Behavioral modification strategies: increasing lean protein intake, increasing vegetables, increasing water intake, decreasing eating out, meal planning and cooking strategies, keeping healthy foods in the home, avoiding temptations, planning for success, and decreasing junk food.  Renee Malone has agreed to follow-up with our clinic in 3-4 weeks. She was informed of the importance of frequent follow-up visits to maximize her success with intensive lifestyle modifications for her multiple health conditions.   Objective:   Blood pressure 116/79, pulse 61, temperature 98.5 F (36.9 C), height 6\' 3"  (1.905 m), weight 243 lb (110.2 kg), SpO2 100 %. Body mass index is 30.37 kg/m.  General: Cooperative, alert, well developed, in no acute distress. HEENT: Conjunctivae and lids unremarkable. Cardiovascular: Regular rhythm.  Lungs: Normal work of breathing. Neurologic: No focal deficits.   Lab Results  Component Value Date   CREATININE 1.18 (H) 07/22/2022   BUN 9 07/22/2022   NA 140 07/22/2022   K 3.5 07/22/2022   CL 107 07/22/2022   CO2 28 07/22/2022   Lab Results  Component Value Date   ALT 17 06/04/2022   AST 19 06/04/2022   ALKPHOS 66 06/04/2022   BILITOT 0.2 06/04/2022   Lab Results  Component Value Date   HGBA1C 6.1 (H) 06/04/2022   HGBA1C 6.1 (H) 01/01/2022   Lab  Results  Component Value Date   INSULIN 17.5 06/04/2022   Lab Results  Component Value Date   TSH 1.420 06/04/2022   Lab Results  Component Value Date   CHOL 169 06/20/2022   HDL 64 06/20/2022   LDLCALC 89 06/20/2022   TRIG 75 06/20/2022   CHOLHDL 2.6 06/20/2022   Lab Results  Component Value Date   VD25OH 27.7 (L) 06/04/2022   Lab Results  Component Value Date   WBC 8.4 07/22/2022   HGB  12.5 07/22/2022   HCT 36.4 07/22/2022   MCV 91.7 07/22/2022   PLT 272 07/22/2022   No results found for: "IRON", "TIBC", "FERRITIN"  Attestation Statements:   Reviewed by clinician on day of visit: allergies, medications, problem list, medical history, surgical history, family history, social history, and previous encounter notes.  I, Malcolm Metro, am acting as Energy manager for Seymour Bars, DO.  I have reviewed the above documentation for accuracy and completeness, and I agree with the above. Glennis Brink, DO

## 2022-09-24 NOTE — Telephone Encounter (Signed)
Prior authorization denied for her ozempic.

## 2022-10-02 ENCOUNTER — Ambulatory Visit (INDEPENDENT_AMBULATORY_CARE_PROVIDER_SITE_OTHER): Payer: BC Managed Care – PPO | Admitting: Physician Assistant

## 2022-10-02 ENCOUNTER — Encounter (INDEPENDENT_AMBULATORY_CARE_PROVIDER_SITE_OTHER): Payer: Self-pay | Admitting: Physician Assistant

## 2022-10-02 VITALS — BP 113/75 | HR 66 | Temp 98.3°F | Ht 75.0 in | Wt 241.0 lb

## 2022-10-02 DIAGNOSIS — R7303 Prediabetes: Secondary | ICD-10-CM | POA: Diagnosis not present

## 2022-10-02 DIAGNOSIS — Z6830 Body mass index (BMI) 30.0-30.9, adult: Secondary | ICD-10-CM

## 2022-10-02 DIAGNOSIS — E669 Obesity, unspecified: Secondary | ICD-10-CM | POA: Diagnosis not present

## 2022-10-02 DIAGNOSIS — F3289 Other specified depressive episodes: Secondary | ICD-10-CM | POA: Diagnosis not present

## 2022-10-02 DIAGNOSIS — E559 Vitamin D deficiency, unspecified: Secondary | ICD-10-CM

## 2022-10-02 MED ORDER — VITAMIN D (ERGOCALCIFEROL) 1.25 MG (50000 UNIT) PO CAPS: 50000.0000 [IU] | ORAL_CAPSULE | ORAL | 0 refills | Status: DC

## 2022-10-02 MED ORDER — BUPROPION HCL ER (XL) 300 MG PO TB24: 300.0000 mg | ORAL_TABLET | Freq: Every day | ORAL | 0 refills | Status: DC

## 2022-10-17 ENCOUNTER — Encounter (INDEPENDENT_AMBULATORY_CARE_PROVIDER_SITE_OTHER): Payer: Self-pay | Admitting: Physician Assistant

## 2022-10-17 ENCOUNTER — Ambulatory Visit (INDEPENDENT_AMBULATORY_CARE_PROVIDER_SITE_OTHER): Payer: 59 | Admitting: Physician Assistant

## 2022-10-17 VITALS — BP 108/77 | HR 75 | Temp 98.0°F | Ht 75.0 in | Wt 243.0 lb

## 2022-10-17 DIAGNOSIS — Z6830 Body mass index (BMI) 30.0-30.9, adult: Secondary | ICD-10-CM

## 2022-10-17 DIAGNOSIS — E669 Obesity, unspecified: Secondary | ICD-10-CM | POA: Diagnosis not present

## 2022-10-17 DIAGNOSIS — R7303 Prediabetes: Secondary | ICD-10-CM

## 2022-10-17 DIAGNOSIS — E559 Vitamin D deficiency, unspecified: Secondary | ICD-10-CM | POA: Diagnosis not present

## 2022-10-17 DIAGNOSIS — F3289 Other specified depressive episodes: Secondary | ICD-10-CM | POA: Diagnosis not present

## 2022-10-17 MED ORDER — VITAMIN D (ERGOCALCIFEROL) 1.25 MG (50000 UNIT) PO CAPS: 50000.0000 [IU] | ORAL_CAPSULE | ORAL | 0 refills | Status: DC

## 2022-10-17 MED ORDER — BUPROPION HCL ER (XL) 300 MG PO TB24: 300.0000 mg | ORAL_TABLET | Freq: Every day | ORAL | 0 refills | Status: DC

## 2022-10-18 LAB — HEMOGLOBIN A1C
Est. average glucose Bld gHb Est-mCnc: 120 mg/dL
Hgb A1c MFr Bld: 5.8 % — ABNORMAL HIGH (ref 4.8–5.6)

## 2022-10-18 LAB — VITAMIN D 25 HYDROXY (VIT D DEFICIENCY, FRACTURES): Vit D, 25-Hydroxy: 38.3 ng/mL (ref 30.0–100.0)

## 2022-10-18 NOTE — Progress Notes (Signed)
Chief Complaint:   OBESITY Renee Malone is here to discuss her progress with her obesity treatment plan along with follow-up of her obesity related diagnoses. Renee Malone is on keeping a food journal and adhering to recommended goals of 1500 calories and 120 grams of protein and states she is following her eating plan approximately ?% of the time.  Today's visit was #: 6 Starting weight: 249 lbs Starting date: 06/04/2022 Today's weight: 241 lbs Today's date: 10/02/2022 Total lbs lost to date: 8 lbs Total lbs lost since last in-office visit: 2  Interim History: Renee Malone has done well with weight loss overall. She reports some increased craving for things with sugar but overall has been more mindful and working on increasing her protein intake. She does report difficulty with meal planning and journaling consistently.  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  Subjective:   1. Vitamin D deficiency Renee Malone's Vitamin D level of 27.7 on 06/04/2022-not at goal.  Taking ergocalciferol 50,000 IU weekly- denies side effects.  2. Prediabetes Renee Malone's A1c at 6.2/insulin at 17.5 on 06/04/2022-not at goal.  She cannot take Metformin due to taking Biktarvy.  She continues working on her prescribed nutrition plan to decrease simple carbs and increase lean protein and exercise to promote weight loss..  3. Other depression, with emotional eating Renee Malone is taking Wellbutrin, feels this is helping with her mood.  Denies any side effects.  Occasionally forgets to take and we discussed strategies to remember to take medication.  Assessment/Plan:   1. Vitamin D deficiency Continue taking ergocalciferol 50,000 R IU weekly.  Will recheck labs at next visit.  2. Prediabetes Will recheck labs at next visit. Continue Prescribed Nutrition Plan to decrease simple carbohydrates, increase lean protein and exercise to promote weight loss.  3. Other  depression, with emotional eating Continue taking Wellbutrin XL 300 mg daily and prescribed nutrition plan, exercise to promote weight loss.  4. Obesity,current BMI 30.2 Renee Malone is currently in the action stage of change. As such, her goal is to continue with weight loss efforts. She has agreed to keeping a food journal and adhering to recommended goals of 1500 calories and 120+ grams of protein daily.   Exercise goals: All adults should avoid inactivity. Some physical activity is better than none, and adults who participate in any amount of physical activity gain some health benefits.  Behavioral modification strategies: increasing lean protein intake, decreasing simple carbohydrates, emotional eating strategies, and keeping a strict food journal.  Renee Malone has agreed to follow-up with our clinic in 2 weeks. She was informed of the importance of frequent follow-up visits to maximize her success with intensive lifestyle modifications for her multiple health conditions.   Objective:   Blood pressure 113/75, pulse 66, temperature 98.3 F (36.8 C), height 6\' 3"  (1.905 m), weight 241 lb (109.3 kg), SpO2 100 %. Body mass index is 30.12 kg/m.  General: Cooperative, alert, well developed, in no acute distress. HEENT: Conjunctivae and lids unremarkable. Cardiovascular: Regular rhythm.  Lungs: Normal work of breathing. Neurologic: No focal deficits.   Lab Results  Component Value Date   CREATININE 1.18 (H) 07/22/2022   BUN 9 07/22/2022   NA 140 07/22/2022   K 3.5 07/22/2022   CL 107 07/22/2022   CO2 28 07/22/2022   Lab Results  Component Value Date   ALT 17 06/04/2022   AST 19 06/04/2022   ALKPHOS 66 06/04/2022   BILITOT 0.2 06/04/2022  Lab Results  Component Value Date   HGBA1C 5.8 (H) 10/17/2022   HGBA1C 6.1 (H) 06/04/2022   HGBA1C 6.1 (H) 01/01/2022   Lab Results  Component Value Date   INSULIN 17.5 06/04/2022   Lab Results  Component Value Date   TSH 1.420 06/04/2022    Lab Results  Component Value Date   CHOL 169 06/20/2022   HDL 64 06/20/2022   LDLCALC 89 06/20/2022   TRIG 75 06/20/2022   CHOLHDL 2.6 06/20/2022   Lab Results  Component Value Date   VD25OH 38.3 10/17/2022   VD25OH 27.7 (L) 06/04/2022   Lab Results  Component Value Date   WBC 8.4 07/22/2022   HGB 12.5 07/22/2022   HCT 36.4 07/22/2022   MCV 91.7 07/22/2022   PLT 272 07/22/2022   No results found for: "IRON", "TIBC", "FERRITIN"  Attestation Statements:   Reviewed by clinician on day of visit: allergies, medications, problem list, medical history, surgical history, family history, social history, and previous encounter notes.  I, Brendell Tyus, am acting as transcriptionist for [employer], PA.  I have reviewed the above documentation for accuracy and completeness, and I agree with the above. -  Roston Grunewald,PA-C

## 2022-10-24 NOTE — Progress Notes (Signed)
Chief Complaint:   OBESITY Renee Malone is here to discuss her progress with her obesity treatment plan along with follow-up of her obesity related diagnoses. Renee Malone is on keeping a food journal and adhering to recommended goals of 1400-1500 calories and 85 grams of protein and states she is following her eating plan approximately 0% of the time. Renee Malone states she is exercising 0 minutes 0 times per week.  Today's visit was #: 7 Starting weight: 249 lbs Starting date: 06/04/2022 Today's weight: 243 lbs Today's date: 10/17/2022 Total lbs lost to date: 6 lbs Total lbs lost since last in-office visit: 0  Interim History: Renee Malone has done well with weight loss overall.  She has been struggling some with her journaling.  She reports she is trying to focus on getting adequate protein and monitoring and being mindful of the amount of protein she is getting. She is reporting some excessive hunger.  Subjective:   1. Prediabetes A1c at 6.1 on 06/04/22-nearing goal.  Could not get GLP-1 Ozempic due to no insurance coverage.  Cannot take Metformin on Biktarvy.  No side effects taking metformin.  Working on decreasing simple carbs, increasing lean protein, exercise to promote weight loss and improvement in glycemic control.  2. Vitamin D deficiency Renee Malone is taking Ergocalciferol once a week.  Denies any side effects.  Level of 27.7 on 06/04/22-not at goal.  3. Other depression, with emotional eating Taking Wellbutrin XL 300 mg daily--Denies any side effects.  Assessment/Plan:   1. Prediabetes We will obtain labs today.  Not fasting today, so no insulin level.  Continue Prescribed Nutrition Plan and exercise to promote weight loss and improvement in glycemic control.  - Hemoglobin A1c  2. Vitamin D deficiency We will obtain labs today.  Continue/Refill Ergocalciferol once weekly for 1 month with 0 refills.  -Refill Vitamin D, Ergocalciferol, (DRISDOL) 1.25 MG (50000 UNIT) CAPS capsule;  Take 1 capsule (50,000 Units total) by mouth every 7 (seven) days.  Dispense: 5 capsule; Refill: 0  - VITAMIN D 25 Hydroxy (Vit-D Deficiency, Fractures)  3. Other depression, with emotional eating Continue/Refill Wellbutrin XL 300 mg daily for 1 month with 0 refills.  -Refill buPROPion (WELLBUTRIN XL) 300 MG 24 hr tablet; Take 1 tablet (300 mg total) by mouth daily.  Dispense: 30 tablet; Refill: 0 We discussed strategies for emotional eating/cravings. 4. Obesity,current BMI 30.5 Renee Malone is currently in the action stage of change. As such, her goal is to continue with weight loss efforts. She has agreed to keeping a food journal and adhering to recommended goals of 1400-1500 calories and 85+ grams of protein daily.   Exercise goals: As is.  Behavioral modification strategies: increasing lean protein intake, decreasing simple carbohydrates, increasing water intake, and emotional eating strategies.  Renee Malone has agreed to follow-up with our clinic in 3 weeks. She was informed of the importance of frequent follow-up visits to maximize her success with intensive lifestyle modifications for her multiple health conditions.   Renee Malone was informed we would discuss his lab results at his next visit unless there is a critical issue that needs to be addressed sooner. Renee Malone agreed to keep his next visit at the agreed upon time to discuss these results.  Objective:   Blood pressure 108/77, pulse 75, temperature 98 F (36.7 C), height 6\' 3"  (1.905 m), weight 243 lb (110.2 kg), SpO2 100 %. Body mass index is 30.37 kg/m.  General: Cooperative, alert, well developed, in no acute distress. HEENT: Conjunctivae and lids unremarkable. Cardiovascular:  Regular rhythm.  Lungs: Normal work of breathing. Neurologic: No focal deficits.   Lab Results  Component Value Date   CREATININE 1.18 (H) 07/22/2022   BUN 9 07/22/2022   NA 140 07/22/2022   K 3.5 07/22/2022   CL 107 07/22/2022   CO2 28 07/22/2022    Lab Results  Component Value Date   ALT 17 06/04/2022   AST 19 06/04/2022   ALKPHOS 66 06/04/2022   BILITOT 0.2 06/04/2022   Lab Results  Component Value Date   HGBA1C 5.8 (H) 10/17/2022   HGBA1C 6.1 (H) 06/04/2022   HGBA1C 6.1 (H) 01/01/2022   Lab Results  Component Value Date   INSULIN 17.5 06/04/2022   Lab Results  Component Value Date   TSH 1.420 06/04/2022   Lab Results  Component Value Date   CHOL 169 06/20/2022   HDL 64 06/20/2022   LDLCALC 89 06/20/2022   TRIG 75 06/20/2022   CHOLHDL 2.6 06/20/2022   Lab Results  Component Value Date   VD25OH 38.3 10/17/2022   VD25OH 27.7 (L) 06/04/2022   Lab Results  Component Value Date   WBC 8.4 07/22/2022   HGB 12.5 07/22/2022   HCT 36.4 07/22/2022   MCV 91.7 07/22/2022   PLT 272 07/22/2022   No results found for: "IRON", "TIBC", "FERRITIN"  Attestation Statements:   Reviewed by clinician on day of visit: allergies, medications, problem list, medical history, surgical history, family history, social history, and previous encounter notes.  I, Brendell Tyus, am acting as transcriptionist for [employer], [place].  I have reviewed the above documentation for accuracy and completeness, and I agree with the above. -  Sahira Cataldi,PA-C

## 2022-11-05 ENCOUNTER — Encounter (INDEPENDENT_AMBULATORY_CARE_PROVIDER_SITE_OTHER): Payer: Self-pay | Admitting: Family Medicine

## 2022-11-05 ENCOUNTER — Ambulatory Visit (INDEPENDENT_AMBULATORY_CARE_PROVIDER_SITE_OTHER): Payer: 59 | Admitting: Family Medicine

## 2022-11-05 VITALS — BP 109/70 | HR 58 | Temp 98.3°F | Ht 75.0 in

## 2022-11-05 DIAGNOSIS — E559 Vitamin D deficiency, unspecified: Secondary | ICD-10-CM

## 2022-11-05 DIAGNOSIS — E669 Obesity, unspecified: Secondary | ICD-10-CM | POA: Diagnosis not present

## 2022-11-05 DIAGNOSIS — E88819 Insulin resistance, unspecified: Secondary | ICD-10-CM

## 2022-11-05 DIAGNOSIS — F3289 Other specified depressive episodes: Secondary | ICD-10-CM | POA: Diagnosis not present

## 2022-11-05 DIAGNOSIS — R7303 Prediabetes: Secondary | ICD-10-CM

## 2022-11-05 DIAGNOSIS — Z6830 Body mass index (BMI) 30.0-30.9, adult: Secondary | ICD-10-CM

## 2022-11-05 MED ORDER — BUPROPION HCL ER (XL) 300 MG PO TB24: 300.0000 mg | ORAL_TABLET | Freq: Every day | ORAL | 0 refills | Status: DC

## 2022-11-05 MED ORDER — VITAMIN D (ERGOCALCIFEROL) 1.25 MG (50000 UNIT) PO CAPS: 50000.0000 [IU] | ORAL_CAPSULE | ORAL | 0 refills | Status: DC

## 2022-11-05 MED ORDER — RYBELSUS 3 MG PO TABS: 3.0000 mg | ORAL_TABLET | Freq: Every day | ORAL | 0 refills | Status: DC

## 2022-11-12 ENCOUNTER — Telehealth (INDEPENDENT_AMBULATORY_CARE_PROVIDER_SITE_OTHER): Payer: Self-pay | Admitting: *Deleted

## 2022-11-12 NOTE — Telephone Encounter (Signed)
Prior authorization done via cover my meds for patients Rybelsus. Waiting on determination.  Renee Malone ([employer]) Rybelsus 3MG  tablets

## 2022-11-12 NOTE — Telephone Encounter (Signed)
Prior authorization denied for patients Rybelsus. Patient notified.  

## 2022-11-15 NOTE — Progress Notes (Signed)
Chief Complaint:   OBESITY Renee Malone is here to discuss her progress with her obesity treatment plan along with follow-up of her obesity related diagnoses. Renee Malone is on keeping a food journal and adhering to recommended goals of 1400-1500 calories and 90+ grams protein daily and states she is following her eating plan approximately 70% of the time. Renee Malone states she is going to the gym for 60 minutes 1-2 times per week.  Today's visit was #: 8 Starting weight: 249 lbs Starting date: 06/04/2022 Today's weight: 244 lbs Today's date: 11/05/22 Total lbs lost to date: 5 Total lbs lost since last in-office visit: +1  Interim History: Added in gym workouts 1-2 times per week. Working 2 jobs, not getting adequate sleep.  Stress levels moderate.  Has some desserts with her husband.  Getting hungry after smoking THC.  Subjective:   1. Insulin resistance Fasting insulin 17.5 on 06/04/2022. Continues to consume high sugar items at home several days per week. Cannot take metformin due to drug interaction with Biktarvy.  2. Pre-diabetes A1c improving to 5.8 from 6.1.  Has added in gym workouts 1-2 times per week. Starting to see a reduction in body fat. Discussed recent lab results.  3. Vitamin D deficiency Improving. Vitamin D level 38.3 from 27.7 on prescription vitamin D 50,000 IU weekly. Energy levels improving. Discussed recent lab results.  4. Other depression, with emotional eating Improving on Wellbutrin XL 300 mg daily. Stress levels remain high. Sleep time is lacking.  Assessment/Plan:   1. Insulin resistance Begin Rybelsus. - Semaglutide (RYBELSUS) 3 MG TABS; Take 1 tablet (3 mg total) by mouth daily.  Dispense: 30 tablet; Refill: 0  2. Pre-diabetes Recheck A1c in 4 months.  Goal less than 5.7. - Semaglutide (RYBELSUS) 3 MG TABS; Take 1 tablet (3 mg total) by mouth daily.  Dispense: 30 tablet; Refill: 0  3. Vitamin D deficiency Reviewed vitamin D goal 50-70.    Refilled: - Vitamin D, Ergocalciferol, (DRISDOL) 1.25 MG (50000 UNIT) CAPS capsule; Take 1 capsule (50,000 Units total) by mouth every 7 (seven) days.  Dispense: 5 capsule; Refill: 0  4. Other depression, with emotional eating Refilled: - buPROPion (WELLBUTRIN XL) 300 MG 24 hr tablet; Take 1 tablet (300 mg total) by mouth daily.  Dispense: 30 tablet; Refill: 0 Practice mindful eating and stress reduction.  5. Obesity, current BMI 30.5 1.  Keep junk food snacks out of house. 2.  Increase gym to 2-3 times per week as work schedule allows.  Renee Malone is currently in the action stage of change. As such, her goal is to continue with weight loss efforts. She has agreed to keeping a food journal and adhering to recommended goals of 1500 calories and 90 gms protein daily.  Exercise goals:  Increase gym to 2-3 times per week as work schedule allows.  Behavioral modification strategies: increasing lean protein intake, increasing vegetables, increasing water intake, no skipping meals, keeping healthy foods in the home, better snacking choices, avoiding temptations, and decreasing junk food.  Renee Malone has agreed to follow-up with our clinic in 4 weeks. She was informed of the importance of frequent follow-up visits to maximize her success with intensive lifestyle modifications for her multiple health conditions.   Objective:   Blood pressure 109/70, pulse (!) 58, temperature 98.3 F (36.8 C), height 6' 3"$  (1.905 m), SpO2 100 %. Body mass index is 30.37 kg/m.  General: Cooperative, alert, well developed, in no acute distress. HEENT: Conjunctivae and lids unremarkable. Cardiovascular: Regular  rhythm.  Lungs: Normal work of breathing. Neurologic: No focal deficits.   Lab Results  Component Value Date   CREATININE 1.18 (H) 07/22/2022   BUN 9 07/22/2022   NA 140 07/22/2022   K 3.5 07/22/2022   CL 107 07/22/2022   CO2 28 07/22/2022   Lab Results  Component Value Date   ALT 17 06/04/2022    AST 19 06/04/2022   ALKPHOS 66 06/04/2022   BILITOT 0.2 06/04/2022   Lab Results  Component Value Date   HGBA1C 5.8 (H) 10/17/2022   HGBA1C 6.1 (H) 06/04/2022   HGBA1C 6.1 (H) 01/01/2022   Lab Results  Component Value Date   INSULIN 17.5 06/04/2022   Lab Results  Component Value Date   TSH 1.420 06/04/2022   Lab Results  Component Value Date   CHOL 169 06/20/2022   HDL 64 06/20/2022   LDLCALC 89 06/20/2022   TRIG 75 06/20/2022   CHOLHDL 2.6 06/20/2022   Lab Results  Component Value Date   VD25OH 38.3 10/17/2022   VD25OH 27.7 (L) 06/04/2022   Lab Results  Component Value Date   WBC 8.4 07/22/2022   HGB 12.5 07/22/2022   HCT 36.4 07/22/2022   MCV 91.7 07/22/2022   PLT 272 07/22/2022   No results found for: "IRON", "TIBC", "FERRITIN"   Attestation Statements:   Reviewed by clinician on day of visit: allergies, medications, problem list, medical history, surgical history, family history, social history, and previous encounter notes.  I, Renee Fick, FNP, am acting as transcriptionist for Dr. Loyal Gambler.  I have reviewed the above documentation for accuracy and completeness, and I agree with the above. Dell Ponto, DO

## 2022-12-03 ENCOUNTER — Other Ambulatory Visit (INDEPENDENT_AMBULATORY_CARE_PROVIDER_SITE_OTHER): Payer: Self-pay | Admitting: Physician Assistant

## 2022-12-03 ENCOUNTER — Encounter (INDEPENDENT_AMBULATORY_CARE_PROVIDER_SITE_OTHER): Payer: Self-pay | Admitting: Physician Assistant

## 2022-12-03 ENCOUNTER — Ambulatory Visit (INDEPENDENT_AMBULATORY_CARE_PROVIDER_SITE_OTHER): Payer: 59 | Admitting: Physician Assistant

## 2022-12-03 VITALS — BP 121/85 | HR 66 | Temp 98.1°F | Ht 75.0 in | Wt 243.8 lb

## 2022-12-03 DIAGNOSIS — E669 Obesity, unspecified: Secondary | ICD-10-CM

## 2022-12-03 DIAGNOSIS — R7303 Prediabetes: Secondary | ICD-10-CM

## 2022-12-03 DIAGNOSIS — E559 Vitamin D deficiency, unspecified: Secondary | ICD-10-CM

## 2022-12-03 DIAGNOSIS — F3289 Other specified depressive episodes: Secondary | ICD-10-CM

## 2022-12-03 DIAGNOSIS — Z6830 Body mass index (BMI) 30.0-30.9, adult: Secondary | ICD-10-CM

## 2022-12-03 MED ORDER — TOPIRAMATE 50 MG PO TABS: 50.0000 mg | ORAL_TABLET | Freq: Every day | ORAL | 0 refills | Status: DC

## 2022-12-03 MED ORDER — BUPROPION HCL ER (XL) 300 MG PO TB24: 300.0000 mg | ORAL_TABLET | Freq: Every day | ORAL | 0 refills | Status: DC

## 2022-12-03 MED ORDER — VITAMIN D (ERGOCALCIFEROL) 1.25 MG (50000 UNIT) PO CAPS: 50000.0000 [IU] | ORAL_CAPSULE | ORAL | 0 refills | Status: DC

## 2022-12-03 NOTE — Progress Notes (Signed)
Chief Complaint:   OBESITY Renee Malone is here to discuss her progress with her obesity treatment plan along with follow-up of her obesity related diagnoses. Renee Malone is on keeping a food journal and adhering to recommended goals of 1500 calories and 90 grams of protein and states she is following her eating plan approximately 0% of the time. Renee Malone states she is not exercising currently  0 minutes 0 times per week.  Today's visit was #: 9 Starting weight: 249 lbs Starting date: 06/04/2022 Today's weight: 243 lbs Today's date: 12/03/2022 Total lbs lost to date: 6 lbs Total lbs lost since last in-office visit: 1 lb  Interim History: Renee Malone has done well overall with weight loss. Reports difficulty adhering to the plan due to increased cravings this past month and has been snacking on sweets and salty foods.  Also reports has not gone to the gym this past month but plans to get back into the gym soon.  Continues to work 2 jobs.  Reports sleeping 6 to 7 hours nightly.  Subjective:   1. Pre-diabetes A1c was improved at 5.8.  Has not been able to tolerate metformin due to drug interaction with Biktarvy.  Working on decreasing simple carbohydrates, increasing lean protein and exercise to promote weight loss.  2. Vitamin D deficiency Vitamin D Deficiency Vitamin D is not at goal of 50.  Most recent vitamin D level was 38.3. She is on  prescription ergocalciferol 50,000 IU weekly. Lab Results  Component Value Date   VD25OH 38.3 10/17/2022   VD25OH 27.7 (L) 06/04/2022    Plan: Refill prescription vitamin D 50,000 IU weekly.   3. Other depression, with emotional eating Feels that Wellbutrin does help with some increased ability to tolerate stressors, but notes continued concerns with cravings.  We discussed topiramate to help with cravings.Patient denies history of glaucoma or kidney stones. She was informed of side effects and that topiramate is teratogenic and this is not a concern  at this time.   4. BMI 30.0-30.9,adult 5. Obesity-- Start BMI 31.12   Assessment/Plan:   1. Pre-diabetes Unable to obtain GLP-1-Rybelsus due to no insurance coverage.  Will continue to work on nutrition plan to decrease simple carbohydrates, increase lean proteins and exercise to promote weight loss and improve glycemic control and decrease insulin resistance.  2. Vitamin D deficiency Refill and continue vitamin D/ergocalciferol 50,000 IU once weekly.  Will plan to recheck level 2-3 times yearly to avoid oversupplementation. - Vitamin D, Ergocalciferol, (DRISDOL) 1.25 MG (50000 UNIT) CAPS capsule; Take 1 capsule (50,000 Units total) by mouth every 7 (seven) days.  Dispense: 5 capsule; Refill: 0  3. Other depression, with emotional eating Continue Wellbutrin XL 300 mg once daily in the morning.  Will also begin topiramate 25 mg at bedtime x 2 weeks and if well-tolerated, increase to 50 mg at bedtime nightly.  Will monitor for response. - buPROPion (WELLBUTRIN XL) 300 MG 24 hr tablet; Take 1 tablet (300 mg total) by mouth daily.  Dispense: 30 tablet; Refill: 0 - topiramate (TOPAMAX) 50 MG tablet; Take 1 tablet (50 mg total) by mouth daily. Take 1/2 tab at bedtime for the first 2 weeks and if tolerated, increase to 50 mg at bedtime  Dispense: 30 tablet; Refill: 0  4. BMI 30.0-30.9,adult, Current BMI 30.5   5. Obesity-- Start BMI 31.12   Renee Malone is currently in the action stage of change. As such, her goal is to continue with weight loss efforts. She has agreed to  keeping a food journal and adhering to recommended goals of 1500 calories and 90 grams of protein.   Exercise goals: All adults should avoid inactivity. Some physical activity is better than none, and adults who participate in any amount of physical activity gain some health benefits.  Behavioral modification strategies: increasing lean protein intake, decreasing simple carbohydrates, emotional eating strategies, and keeping a  strict food journal.  Renee Malone has agreed to follow-up with our clinic in 3 weeks. She was informed of the importance of frequent follow-up visits to maximize her success with intensive lifestyle modifications for her multiple health conditions.     Objective:   Blood pressure 121/85, pulse 66, temperature 98.1 F (36.7 C), height '6\' 3"'$  (1.905 m), weight 243 lb 12.8 oz (110.6 kg), SpO2 100 %. Body mass index is 30.47 kg/m.  General: Cooperative, alert, well developed, in no acute distress. HEENT: Conjunctivae and lids unremarkable. Cardiovascular: Regular rhythm.  Lungs: Normal work of breathing. Neurologic: No focal deficits.   Lab Results  Component Value Date   CREATININE 1.18 (H) 07/22/2022   BUN 9 07/22/2022   NA 140 07/22/2022   K 3.5 07/22/2022   CL 107 07/22/2022   CO2 28 07/22/2022   Lab Results  Component Value Date   ALT 17 06/04/2022   AST 19 06/04/2022   ALKPHOS 66 06/04/2022   BILITOT 0.2 06/04/2022   Lab Results  Component Value Date   HGBA1C 5.8 (H) 10/17/2022   HGBA1C 6.1 (H) 06/04/2022   HGBA1C 6.1 (H) 01/01/2022   Lab Results  Component Value Date   INSULIN 17.5 06/04/2022   Lab Results  Component Value Date   TSH 1.420 06/04/2022   Lab Results  Component Value Date   CHOL 169 06/20/2022   HDL 64 06/20/2022   LDLCALC 89 06/20/2022   TRIG 75 06/20/2022   CHOLHDL 2.6 06/20/2022   Lab Results  Component Value Date   VD25OH 38.3 10/17/2022   VD25OH 27.7 (L) 06/04/2022   Lab Results  Component Value Date   WBC 8.4 07/22/2022   HGB 12.5 07/22/2022   HCT 36.4 07/22/2022   MCV 91.7 07/22/2022   PLT 272 07/22/2022   No results found for: "IRON", "TIBC", "FERRITIN"  Obesity Behavioral Intervention:   Approximately 15 minutes were spent on the discussion below.  ASK: We discussed the diagnosis of obesity with Renee Malone today and Renee Malone agreed to give [place] permission to discuss obesity behavioral modification therapy  today.  ASSESS: Renee Malone has the diagnosis of obesity and her BMI today is 30.5. Renee Malone is in the action stage of change.   ADVISE: Renee Malone was educated on the multiple health risks of obesity as well as the benefit of weight loss to improve her health. She was advised of the need for long term treatment and the importance of lifestyle modifications to improve her current health and to decrease her risk of future health problems.  AGREE: Multiple dietary modification options and treatment options were discussed and Renee Malone agreed to follow the recommendations documented in the above note.  ARRANGE: Renee Malone was educated on the importance of frequent visits to treat obesity as outlined per CMS and USPSTF guidelines and agreed to schedule her next follow up appointment today.  Attestation Statements:   Reviewed by clinician on day of visit: allergies, medications, problem list, medical history, surgical history, family history, social history, and previous encounter notes.  Erique Kaser,PA-C

## 2022-12-11 ENCOUNTER — Encounter: Payer: 59 | Admitting: Nurse Practitioner

## 2022-12-24 ENCOUNTER — Ambulatory Visit (INDEPENDENT_AMBULATORY_CARE_PROVIDER_SITE_OTHER): Payer: 59 | Admitting: Physician Assistant

## 2022-12-25 ENCOUNTER — Emergency Department (HOSPITAL_BASED_OUTPATIENT_CLINIC_OR_DEPARTMENT_OTHER): Payer: 59 | Admitting: Radiology

## 2022-12-25 ENCOUNTER — Emergency Department (HOSPITAL_BASED_OUTPATIENT_CLINIC_OR_DEPARTMENT_OTHER)
Admission: EM | Admit: 2022-12-25 | Discharge: 2022-12-25 | Disposition: A | Discharge status: Home / Self Care | Payer: 59 | Attending: Emergency Medicine | Admitting: Emergency Medicine

## 2022-12-25 ENCOUNTER — Other Ambulatory Visit: Payer: Self-pay

## 2022-12-25 ENCOUNTER — Encounter (HOSPITAL_BASED_OUTPATIENT_CLINIC_OR_DEPARTMENT_OTHER): Payer: Self-pay

## 2022-12-25 ENCOUNTER — Other Ambulatory Visit (HOSPITAL_BASED_OUTPATIENT_CLINIC_OR_DEPARTMENT_OTHER): Payer: Self-pay

## 2022-12-25 DIAGNOSIS — M25531 Pain in right wrist: Secondary | ICD-10-CM | POA: Diagnosis not present

## 2022-12-25 DIAGNOSIS — Z87891 Personal history of nicotine dependence: Secondary | ICD-10-CM | POA: Insufficient documentation

## 2022-12-25 DIAGNOSIS — Z21 Asymptomatic human immunodeficiency virus [HIV] infection status: Secondary | ICD-10-CM | POA: Insufficient documentation

## 2022-12-25 NOTE — Discharge Instructions (Signed)
As discussed, x-ray imaging negative for any acute fracture or dislocation.  He may continue to use wrist brace at home for right wrist pain.  I have set up referral for physical therapy given wrist weakness expressed.  You may continue to use Tylenol/Motrin as needed for pain, rest, ice affected wrist as well as elevate affected wrist above the level of your heart.  Please do not hesitate to return to emergency department for worrisome signs and symptoms we discussed become apparent.

## 2022-12-25 NOTE — ED Triage Notes (Addendum)
Patient here POV from Home.  Endorses, on Saturday, went skating when she fell onto her Right Wrist. Has been sore since.   Home Wrist Brace in Place.   NAD Noted during Triage. A&Ox4. Gcs 15. Ambulatory.

## 2022-12-25 NOTE — ED Provider Notes (Signed)
[facility] Provider Note   CSN: [id number] Arrival date & time: 12/25/22  1250     History  Chief Complaint  Patient presents with   Fall    Renee Malone is a 37 y.o. adult.   Fall   37 year old female presents emergency department with complaints of right wrist pain.  Patient states that she was rollerskating this past Saturday when she fell landing on her bottom.  She states that when she tried to get up from being on the floor, she felt like she hyperextended her right wrist and has had pain since then.  Has been using right wrist brace which has helped symptoms but presents due to persistent pain.  Denies any repeat injury, weakness/sensory deficits in affected hand/wrist.  Denies trauma to head, loss of consciousness, chest pain, shortness of breath, abdominal pain, nausea, vomiting, blood thinner use.  Past medical history significant for HIV, anemia  Home Medications Prior to Admission medications   Medication Sig Start Date End Date Taking? Authorizing Provider  ALPRAZolam (XANAX) 1 MG tablet TAKE ONE TABLET BY MOUTH EVERY 8 HOURS AS NEEDED Patient not taking: No sig reported 01/10/12   Michel Bickers, MD  bictegravir-emtricitabine-tenofovir AF (BIKTARVY) 50-200-25 MG TABS tablet Take 1 tablet by mouth daily. 06/20/22   Truman Hayward, MD  buPROPion (WELLBUTRIN XL) 300 MG 24 hr tablet Take 1 tablet (300 mg total) by mouth daily. 12/03/22   Rayburn, Neta Mends, PA-C  estradiol (ESTRACE) 2 MG tablet Take 1 tablet (2 mg total) by mouth daily. 08/15/22   Shamleffer, Melanie Crazier, MD  hydrocortisone 1 % ointment Apply 1 application topically 2 (two) times daily. 05/27/19   Truman Hayward, MD  Semaglutide (RYBELSUS) 3 MG TABS Take 1 tablet (3 mg total) by mouth daily. 11/05/22   Bowen, Collene Leyden, DO  topiramate (TOPAMAX) 50 MG tablet Take 1 tablet (50 mg total) by mouth daily. Take 1/2 tab at bedtime for the first  2 weeks and if tolerated, increase to 50 mg at bedtime 12/03/22   Rayburn, Neta Mends, PA-C  Vitamin D, Ergocalciferol, (DRISDOL) 1.25 MG (50000 UNIT) CAPS capsule Take 1 capsule (50,000 Units total) by mouth every 7 (seven) days. 12/03/22   Rayburn, Neta Mends, PA-C      Allergies    Eual Fines Marta Lamas oleracea] and Other    Review of Systems   Review of Systems  All other systems reviewed and are negative.   Physical Exam Updated Vital Signs BP 125/83 (BP Location: Right Arm)   Pulse 71   Temp 98.3 F (36.8 C) (Oral)   Resp 18   Ht 6\' 3"  (1.905 m)   Wt 108.9 kg   SpO2 100%   BMI 30.00 kg/m  Physical Exam Vitals and nursing note reviewed.  Constitutional:      General: She is not in acute distress.    Appearance: She is well-developed.  HENT:     Head: Normocephalic and atraumatic.  Eyes:     Conjunctiva/sclera: Conjunctivae normal.  Cardiovascular:     Rate and Rhythm: Normal rate and regular rhythm.     Heart sounds: No murmur heard. Pulmonary:     Effort: Pulmonary effort is normal. No respiratory distress.     Breath sounds: Normal breath sounds.  Abdominal:     Palpations: Abdomen is soft.     Tenderness: There is no abdominal tenderness.  Musculoskeletal:        General: No  swelling.     Cervical back: Neck supple.     Comments: No midline tenderness of cervical thoracic, lumbar spine with no obvious step-off or deformity noted.  No chest wall tenderness.  Patient moves all 4 extremities without difficulty.  Muscular strength 5 out of 5 for upper lower extremities.  Patient with tender to palpation of the right wrist with no overlying skin abnormalities appreciated.  Patient make fist, thumbs up, okay sign, resist horizontal adduction of digit, extend wrist, oppose thumb without difficulty bilaterally.  Radial pedal pulses 2+ bilaterally.  No sensory deficits along major nerve distributions of upper and lower extremities.  No anatomical snuffbox  tenderness or pain with axial loading of the thumb.  Skin:    General: Skin is warm and dry.     Capillary Refill: Capillary refill takes less than 2 seconds.  Neurological:     Mental Status: She is alert.  Psychiatric:        Mood and Affect: Mood normal.     ED Results / Procedures / Treatments   Labs (all labs ordered are listed, but only abnormal results are displayed) Labs Reviewed - No data to display  EKG None  Radiology DG Wrist Complete Right  Result Date: 12/25/2022 CLINICAL DATA:  RIGHT wrist pain, fell on Saturday EXAM: RIGHT WRIST - COMPLETE 3+ VIEW COMPARISON:  None Available. FINDINGS: Osseous mineralization normal. Joint spaces preserved. No fracture, dislocation, or bone destruction. IMPRESSION: Normal exam. Electronically Signed   By: Lavonia Dana M.D.   On: 12/25/2022 13:32    Procedures Procedures    Medications Ordered in ED Medications - No data to display  ED Course/ Medical Decision Making/ A&P                             Medical Decision Making Amount and/or Complexity of Data Reviewed Radiology: ordered.   This patient presents to the ED for concern of right wrist pain, this involves an extensive number of treatment options, and is a complaint that carries with it a high risk of complications and morbidity.  The differential diagnosis includes fracture, dislocation, ligamentous/tendinous injury, neurovascular compromise, compartment syndrome, cellulitis, erysipelas, necrotizing fasciitis   Co morbidities that complicate the patient evaluation  See HPI   Additional history obtained:  Additional history obtained from EMR External records from outside source obtained and reviewed including hospital records   Lab Tests:  N/a   Imaging Studies ordered:  I ordered imaging studies including right wrist xray  I independently visualized and interpreted imaging which showed no acute osseous abnormality I agree with the radiologist  interpretation   Cardiac Monitoring: / EKG:  The patient was maintained on a cardiac monitor.  I personally viewed and interpreted the cardiac monitored which showed an underlying rhythm of: sinus rhythm   Consultations Obtained:  N/a   Problem List / ED Course / Critical interventions / Medication management  Right wrist pain Reevaluation of the patient showed that the patient stayed the same I have reviewed the patients home medicines and have made adjustments as needed   Social Determinants of Health:  Former cigarette use.  Denies illicit drug use.   Test / Admission - Considered:  Right wrist pain Vitals signs within normal range and stable throughout visit. Imaging studies significant for: See above Patient with evidence of right wrist pain with no acute osseous abnormality appreciated on x-ray imaging.  Posttreatment without overlying clinical signs of  infection, compartment syndrome, neurovascular compromise.  Recommend continued use of brace at home, rest, ice, elevation.  Recommend follow-up with orthopedics outpatient for reevaluation of symptoms.  Treatment plan discussed at length with patient and she acknowledged understanding was agreeable to said plan. Worrisome signs and symptoms were discussed with the patient, and the patient acknowledged understanding to return to the ED if noticed. Patient was stable upon discharge.          Final Clinical Impression(s) / ED Diagnoses Final diagnoses:  Right wrist pain    Rx / DC Orders ED Discharge Orders          Ordered    Ambulatory referral to Physical Therapy       Comments: R wrist pain   12/25/22 [place], [place], [place] 12/25/22 1342    Jeanell Sparrow, DO 12/25/22 1528

## 2022-12-31 ENCOUNTER — Other Ambulatory Visit (HOSPITAL_COMMUNITY)
Admission: RE | Admit: 2022-12-31 | Discharge: 2022-12-31 | Disposition: A | Discharge status: Home / Self Care | Payer: 59 | Source: Ambulatory Visit | Attending: Infectious Disease | Admitting: Infectious Disease

## 2022-12-31 ENCOUNTER — Encounter: Payer: Self-pay | Admitting: Infectious Disease

## 2022-12-31 ENCOUNTER — Other Ambulatory Visit: Payer: Self-pay

## 2022-12-31 ENCOUNTER — Ambulatory Visit: Payer: 59 | Admitting: Infectious Disease

## 2022-12-31 VITALS — BP 136/84 | HR 70 | Temp 97.3°F | Ht 75.0 in | Wt 252.0 lb

## 2022-12-31 DIAGNOSIS — Z789 Other specified health status: Secondary | ICD-10-CM | POA: Diagnosis not present

## 2022-12-31 DIAGNOSIS — B20 Human immunodeficiency virus [HIV] disease: Secondary | ICD-10-CM | POA: Insufficient documentation

## 2022-12-31 DIAGNOSIS — R635 Abnormal weight gain: Secondary | ICD-10-CM

## 2022-12-31 MED ORDER — BIKTARVY 50-200-25 MG PO TABS: 1.0000 | ORAL_TABLET | Freq: Every day | ORAL | 11 refills | Status: DC

## 2022-12-31 NOTE — Progress Notes (Signed)
Subjective:  Chief complaint: For HIV disease on Biktarvy Patient ID: Renee Malone, adult    DOB: Aug 08, 1986, 37 y.o.   MRN: [id number]  HPI  37 year old transgender Black woman with HIV that has perfectly controlled on [place] but had concerns about potentially missing doses and that reason we changed to [facility] with its higher barrier to resistance.  She has been seeing the weight management team here at [facility].  Unfortunately they have not build to procure newer drugs to effect weight loss though metformin was considered.   Past Medical History:  Diagnosis Date   Anxiety    panic attacks   Elevated serum creatinine 06/10/2019   Heart murmur    HIV infection (HCC)    Lactose intolerance    Low hemoglobin 05/25/2020   Multiple food allergies    Prediabetes 05/25/2020   SOBOE (shortness of breath on exertion)    Weight gain 02/12/2022    Past Surgical History:  Procedure Laterality Date   BREAST ENHANCEMENT SURGERY Bilateral 02/25/2018   Procedure: MAMMOPLASTY AUGMENTATION WITH SILICONE IMPLANTS;  Surgeon: Irene Limbo, MD;  Location: [facility];  Service: Plastics;  Laterality: Bilateral;   BREAST LUMPECTOMY Right 02/25/2018   Procedure: EXCISION RIGHT BREAST MASS;  Surgeon: Irene Limbo, MD;  Location: [facility];  Service: Plastics;  Laterality: Right;   SKIN GRAFT      Family History  Problem Relation Age of Onset   Hypertension Mother    Depression Mother    Anxiety disorder Mother    Bipolar disorder Mother    Schizophrenia Mother    Sleep apnea Mother    Drug abuse Mother    Obesity Mother    Hypertension Maternal Grandmother       Social History   Socioeconomic History   Marital status: Married    Spouse name: Not on file   Number of children: Not on file   Years of education: Not on file   Highest education level: Not on file  Occupational History   Occupation: CNA   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    Social Determinants of Health   Financial Resource Strain: Not on file  Food Insecurity: Not on file  Transportation Needs: Not on file  Physical Activity: Not on file  Stress: Not on file  Social Connections: Not on file    Allergies  Allergen Reactions   Broccoli [Brassica Oleracea] Anaphylaxis   Other Anaphylaxis    Cauliflower     Current Outpatient Medications:    buPROPion (WELLBUTRIN XL) 300 MG 24 hr tablet, Take 1 tablet (300 mg total) by mouth daily., Disp: 30 tablet, Rfl: 0   estradiol (ESTRACE) 2 MG tablet, Take 1 tablet (2 mg total) by mouth daily., Disp: 90 tablet, Rfl: 1  hydrocortisone 1 % ointment, Apply 1 application topically 2 (two) times daily., Disp: 30 g, Rfl: 2   topiramate (TOPAMAX) 50 MG tablet, Take 1 tablet (50 mg total) by mouth daily. Take 1/2 tab at bedtime for the first 2 weeks and if tolerated, increase to 50 mg at bedtime, Disp: 30 tablet, Rfl: 0   Vitamin D, Ergocalciferol, (DRISDOL) 1.25 MG (50000 UNIT) CAPS capsule, Take 1 capsule (50,000 Units total) by mouth every 7 (seven) days., Disp: 5 capsule, Rfl: 0   bictegravir-emtricitabine-tenofovir AF (BIKTARVY) 50-200-25 MG TABS tablet, Take 1 tablet by mouth daily., Disp: 30 tablet, Rfl: 11   Semaglutide (RYBELSUS) 3 MG TABS, Take 1 tablet (3 mg total) by mouth daily. (Patient not taking: Reported on 12/31/2022), Disp: 30 tablet, Rfl: 0   Review of Systems   Constitutional:  Negative for activity change, appetite change, chills, diaphoresis, fatigue, fever and unexpected weight change.  HENT:  Negative for congestion, rhinorrhea, sinus pressure, sneezing, sore throat and trouble swallowing.   Eyes:  Negative for photophobia and visual disturbance.  Respiratory:  Negative for cough, chest tightness, shortness of breath, wheezing and stridor.   Cardiovascular:  Negative for chest pain, palpitations and leg swelling.  Gastrointestinal:  Negative for abdominal distention, abdominal pain, anal bleeding, blood in stool, constipation, diarrhea, nausea and vomiting.  Genitourinary:  Negative for difficulty urinating, dysuria, flank pain and hematuria.  Musculoskeletal:  Negative for arthralgias, back pain, gait problem, joint swelling and myalgias.  Skin:  Negative for color change, pallor, rash and wound.  Neurological:  Negative for dizziness, tremors, weakness and light-headedness.  Hematological:  Negative for adenopathy. Does not bruise/bleed easily.  Psychiatric/Behavioral:  Negative for agitation, behavioral problems, confusion, decreased concentration, dysphoric mood and sleep disturbance.        Objective:   Physical Exam Constitutional:      Appearance: She is well-developed.  HENT:     Head: Normocephalic and atraumatic.  Eyes:     Conjunctiva/sclera: Conjunctivae normal.  Cardiovascular:     Rate and Rhythm: Normal rate and regular rhythm.  Pulmonary:     Effort: Pulmonary effort is normal. No respiratory distress.     Breath sounds: No wheezing.  Abdominal:     General: There is no distension.     Palpations: Abdomen is soft.  Musculoskeletal:        General: No tenderness. Normal range of motion.     Cervical back: Normal range of motion and neck supple.  Skin:    General: Skin is warm and dry.     Coloration: Skin is not pale.     Findings: No erythema or rash.  Neurological:     General: No focal deficit present.      Mental Status: She is alert and oriented to person, place, and time.  Psychiatric:        Mood and Affect: Mood normal.        Behavior: Behavior normal.        Thought Content: Thought content normal.        Judgment: Judgment normal.           Assessment & Plan:   HIV disease:  I will add order HIV viral load CD4 count CBC with differential CMP, RPR GC and chlamydia and I will continue  Alayziah Raveena Ice's Biktarvy, prescription  Transgender health: Status post orchiectomy and reconstructive surgery.  She is on estradiol low-dose.  We did talk to her about the ACT G "get it right  study involving estradiol dose escalation but required to stop estradiol --he does not want to that since when she does stop if she starts experiencing menopausal symptoms.  Obesity: She will continue to work with the weight management clinic if they want to use metformin it can be used BIKTARVY it is just that the chances of diarrhea happening could occur at a lower dose but many of our patients are on full dose metformin for diabetes for example.  She is working on carbohydrate restriction in the interim.  Cardiovascular prevention: She is going to be nearing 38 and we will need to start talking about initiating a statin at that time based on the reprieve study

## 2023-01-01 ENCOUNTER — Other Ambulatory Visit (INDEPENDENT_AMBULATORY_CARE_PROVIDER_SITE_OTHER): Payer: Self-pay | Admitting: Physician Assistant

## 2023-01-01 ENCOUNTER — Ambulatory Visit (INDEPENDENT_AMBULATORY_CARE_PROVIDER_SITE_OTHER): Payer: 59 | Admitting: Physician Assistant

## 2023-01-01 ENCOUNTER — Encounter (INDEPENDENT_AMBULATORY_CARE_PROVIDER_SITE_OTHER): Payer: Self-pay | Admitting: Physician Assistant

## 2023-01-01 VITALS — BP 117/82 | HR 68 | Temp 97.8°F | Ht 75.0 in | Wt 249.0 lb

## 2023-01-01 DIAGNOSIS — E669 Obesity, unspecified: Secondary | ICD-10-CM | POA: Diagnosis not present

## 2023-01-01 DIAGNOSIS — R7303 Prediabetes: Secondary | ICD-10-CM | POA: Diagnosis not present

## 2023-01-01 DIAGNOSIS — E559 Vitamin D deficiency, unspecified: Secondary | ICD-10-CM | POA: Diagnosis not present

## 2023-01-01 DIAGNOSIS — F3289 Other specified depressive episodes: Secondary | ICD-10-CM

## 2023-01-01 DIAGNOSIS — Z6831 Body mass index (BMI) 31.0-31.9, adult: Secondary | ICD-10-CM

## 2023-01-01 LAB — T-HELPER CELLS (CD4) COUNT (NOT AT ARMC)
CD4 % Helper T Cell: 46 % (ref 33–65)
CD4 T Cell Abs: 989 /uL (ref 400–1790)

## 2023-01-01 LAB — URINE CYTOLOGY ANCILLARY ONLY
Chlamydia: NEGATIVE
Comment: NEGATIVE
Comment: NORMAL
Neisseria Gonorrhea: NEGATIVE

## 2023-01-01 MED ORDER — BUPROPION HCL ER (XL) 300 MG PO TB24: 300.0000 mg | ORAL_TABLET | Freq: Every day | ORAL | 0 refills | Status: DC

## 2023-01-01 MED ORDER — VITAMIN D (ERGOCALCIFEROL) 1.25 MG (50000 UNIT) PO CAPS: 50000.0000 [IU] | ORAL_CAPSULE | ORAL | 0 refills | Status: DC

## 2023-01-01 MED ORDER — TOPIRAMATE 50 MG PO TABS: 50.0000 mg | ORAL_TABLET | Freq: Every day | ORAL | 0 refills | Status: DC

## 2023-01-01 NOTE — Assessment & Plan Note (Signed)
Vitamin D Deficiency Vitamin D is not at goal of 50.  Most recent vitamin D level was 38.3. She is on  prescription ergocalciferol 50,000 IU weekly. Lab Results  Component Value Date   VD25OH 38.3 10/17/2022   VD25OH 27.7 (L) 06/04/2022    Plan: Refill prescription vitamin D 50,000 IU weekly. Follow up vit D level in 2-3 months to avoid over supplementation.

## 2023-01-01 NOTE — Assessment & Plan Note (Addendum)
Prediabetes Last A1c was 5.8  Medication(s): We discussed starting metformin today. There is some mild decrease in renal function with GFR 56.  Saw Dr. Tommy Medal from Infectious Disease Service and advised okay to use metformin with Biktarvy. We discussed possibly increase in GI side effects with this combination. The patient would like to continue to work on the nutrition plan and exercise to promote weight loss, improve insulin resistance and glycemic control for now.    Lab Results  Component Value Date   HGBA1C 5.8 (H) 10/17/2022   HGBA1C 6.1 (H) 06/04/2022   HGBA1C 6.1 (H) 01/01/2022   Lab Results  Component Value Date   INSULIN 17.5 06/04/2022    Plan:  Continue working on nutrition plan to decrease simple carbohydrates, increase lean proteins and exercise to promote weight loss, improve glycemic control and prevent progression to Type 2 diabetes.  Will plan to recheck labs again in 2-3 months to see if A1c improved further with better adherence to eating plan.

## 2023-01-01 NOTE — Assessment & Plan Note (Signed)
Eating disorder/emotional eating Renee Malone has had issues with stress/emotional eating. Currently this is moderately controlled. Overall mood is stable. Medication(s): Bupropion XL 300 mg daily and Topiramate 50 mg at bedtime. No side effects with medications.   Plan: Continue and refill Bupropion XL 300 mg daily and Topiramate 50 mg at bedtime.  Continue to work on emotional eating strategies.

## 2023-01-01 NOTE — Progress Notes (Signed)
Office: [phone]  /  Fax: [phone]  WEIGHT SUMMARY AND BIOMETRICS  Vitals Temp: 97.8 F (36.6 C) BP: 117/82 Pulse Rate: 68 SpO2: 90 %   Anthropometric Measurements Height: 6\' 3"  (1.905 m) Weight: 249 lb (112.9 kg) BMI (Calculated): 31.12 Weight at Last Visit: 243 lb Weight Lost Since Last Visit: 0 lb Weight Gained Since Last Visit: 6 lb Starting Weight: 249 lb Total Weight Loss (lbs): 6 lb (2.722 kg)   Body Composition  Body Fat %: 41.3 % Fat Mass (lbs): 103 lbs Muscle Mass (lbs): 139 lbs Total Body Water (lbs): 93 lbs Visceral Fat Rating : 8   Other Clinical Data Fasting: Yes Labs: No Today's Visit #: 10 Starting Date: 06/04/22     HPI  Chief Complaint: OBESITY  Renee Malone is here to discuss her progress with her obesity treatment plan. She is on the keeping a food journal and adhering to recommended goals of 1500 calories and 90 grams protein and states she is following her eating plan approximately 0 % of the time. She states she is exercising 0 minutes 0 times per week.   Interval History:  Since last office visit she has struggled with eating sweets/candy.  Reports was very pleased with improvement in A1C to 5.8 and maybe got off track as was reassured by the improvement.  Up 6 lbs.since last visit.  Working to get back on track with eating plan.  Sleep is very good since starting topiramate, but does not feel it is helping with cravings yet.  Not exercising consistently.    Pharmacotherapy: Wellbutrin XL 300 mg daily. No side effects    Topiramate 50 mg at bedtime. No side effects.  (Rybelsus for insulin resistance was not approved and never started medication.)  PHYSICAL EXAM:  Blood pressure 117/82, pulse 68, temperature 97.8 F (36.6 C), height 6\' 3"  (1.905 m), weight 249 lb (112.9 kg), SpO2 90 %. Body mass index is 31.12 kg/m.  General: She is overweight, cooperative, alert, well developed, and in no acute distress. PSYCH: Has  normal mood, affect and thought process.   Cardiovascular: Regular rhythm Lungs: Normal breathing effort, no conversational dyspnea. Neuro: no focal deficits  DIAGNOSTIC DATA REVIEWED:  BMET    Component Value Date/Time   NA 141 12/31/2022 1201   NA 140 06/04/2022 0927   K 4.3 12/31/2022 1201   CL 106 12/31/2022 1201   CO2 26 12/31/2022 1201   GLUCOSE 95 12/31/2022 1201   BUN 20 12/31/2022 1201   BUN 13 06/04/2022 0927   CREATININE 1.27 (H) 12/31/2022 1201   CALCIUM 10.0 12/31/2022 1201   GFRNONAA >60 07/22/2022 1655   GFRNONAA 63 01/30/2021 0000   GFRAA 73 01/30/2021 0000   Lab Results  Component Value Date   HGBA1C 5.8 (H) 10/17/2022   HGBA1C 6.1 (H) 01/01/2022   Lab Results  Component Value Date   INSULIN 17.5 06/04/2022   Lab Results  Component Value Date   TSH 1.420 06/04/2022   CBC    Component Value Date/Time   WBC 6.7 12/31/2022 1201   RBC 4.41 12/31/2022 1201   HGB 13.6 12/31/2022 1201   HGB 13.4 06/04/2022 0927   HCT 40.0 12/31/2022 1201   HCT 39.6 06/04/2022 0927   PLT 275 12/31/2022 1201   PLT 259 06/04/2022 0927   MCV 90.7 12/31/2022 1201   MCV 92 06/04/2022 0927   MCH 30.8 12/31/2022 1201   MCHC 34.0 12/31/2022 1201   RDW 12.4 12/31/2022 1201   RDW  12.6 06/04/2022 0927   Iron Studies No results found for: "IRON", "TIBC", "FERRITIN", "IRONPCTSAT" Lipid Panel     Component Value Date/Time   CHOL 192 12/31/2022 1201   CHOL 194 06/04/2022 0927   TRIG 87 12/31/2022 1201   HDL 70 12/31/2022 1201   HDL 68 06/04/2022 0927   CHOLHDL 2.7 12/31/2022 1201   VLDL 31 (H) 11/14/2016 1613   LDLCALC 104 (H) 12/31/2022 1201   Hepatic Function Panel     Component Value Date/Time   PROT 7.5 12/31/2022 1201   PROT 7.2 06/04/2022 0927   ALBUMIN 4.7 06/04/2022 0927   AST 15 12/31/2022 1201   ALT 13 12/31/2022 1201   ALKPHOS 66 06/04/2022 0927   BILITOT 0.2 12/31/2022 1201   BILITOT 0.2 06/04/2022 0927      Component Value Date/Time   TSH 1.420  06/04/2022 0927   Nutritional Lab Results  Component Value Date   VD25OH 38.3 10/17/2022   VD25OH 27.7 (L) 06/04/2022    ASSOCIATED CONDITIONS ADDRESSED TODAY  ASSESSMENT AND PLAN  Problem List Items Addressed This Visit     Depression    Eating disorder/emotional eating Renee Malone has had issues with stress/emotional eating. Currently this is moderately controlled. Overall mood is stable. Medication(s): Bupropion XL 300 mg daily and Topiramate 50 mg at bedtime. No side effects with medications.   Plan: Continue and refill Bupropion XL 300 mg daily and Topiramate 50 mg at bedtime.  Continue to work on emotional eating strategies.           Relevant Medications   buPROPion (WELLBUTRIN XL) 300 MG 24 hr tablet   topiramate (TOPAMAX) 50 MG tablet   Prediabetes - Primary    Prediabetes Last A1c was 5.8  Medication(s): We discussed starting metformin today. There is some mild decrease in renal function with GFR 56.  Saw Dr. Tommy Medal [facility] and advised okay to use metformin with Biktarvy. We discussed possibly increase in GI side effects with this combination. The patient would like to continue to work on the nutrition plan and exercise to promote weight loss, improve insulin resistance and glycemic control for now.    Lab Results  Component Value Date   HGBA1C 5.8 (H) 10/17/2022   HGBA1C 6.1 (H) 06/04/2022   HGBA1C 6.1 (H) 01/01/2022   Lab Results  Component Value Date   INSULIN 17.5 06/04/2022   Plan:  Continue working on nutrition plan to decrease simple carbohydrates, increase lean proteins and exercise to promote weight loss, improve glycemic control and prevent progression to Type 2 diabetes.  Will plan to recheck labs again in 2-3 months to see if A1c improved further with better adherence to eating plan.        Vitamin D deficiency    Vitamin D Deficiency Vitamin D is not at goal of 50.  Most recent vitamin D level was 38.3. She is on   prescription ergocalciferol 50,000 IU weekly. Lab Results  Component Value Date   VD25OH 38.3 10/17/2022   VD25OH 27.7 (L) 06/04/2022   Plan: Refill prescription vitamin D 50,000 IU weekly. Follow up vit D level in 2-3 months to avoid over supplementation.        Relevant Medications   Vitamin D, Ergocalciferol, (DRISDOL) 1.25 MG (50000 UNIT) CAPS capsule   Other Visit Diagnoses     Obesity-- Start BMI 31.12       BMI 31.0-31.9,adult Current BMI 31.2  TREATMENT PLAN FOR OBESITY:  Recommended Dietary Goals  Renee Malone is currently in the action stage of change. As such, her goal is to continue weight management plan. She has agreed to keeping a food journal and adhering to recommended goals of 1500 calories and 90+ grams of protein.  Behavioral Intervention  We discussed the following Behavioral Modification Strategies today: increasing lean protein intake, decreasing simple carbohydrates , increasing vegetables, increasing water intake, work on meal planning and easy cooking plans, planning for success, better snacking choices, and keeping healthy foods at home.  Additional resources provided today: NA  Recommended Physical Activity Goals  Renee Malone has been advised to work up to 150 minutes of moderate intensity aerobic activity a week and strengthening exercises 2-3 times per week for cardiovascular health, weight loss maintenance and preservation of muscle mass.   She has agreed to Continue current level of physical activity  and Think about ways to increase physical activity   Pharmacotherapy We discussed various medication options to help Renee Malone with her weight loss efforts and we both agreed to continue Wellbutrin and topiramate at current doses. .    Return in about 4 weeks (around 01/29/2023) for Fasting Lab.Marland Kitchen She was informed of the importance of frequent follow up visits to maximize her success with intensive lifestyle modifications for her multiple  health conditions.   ATTESTASTION STATEMENTS:  Reviewed by clinician on day of visit: allergies, medications, problem list, medical history, surgical history, family history, social history, and previous encounter notes.   I have personally spent 35 minutes total time today in preparation, patient care, nutritional counseling and documentation for this visit, including the following: review of clinical lab tests; review of medical tests/procedures/services.      Renee Levitan, PA-C

## 2023-01-03 LAB — COMPLETE METABOLIC PANEL WITH GFR
AG Ratio: 1.8 (calc) (ref 1.0–2.5)
ALT: 13 U/L (ref 6–29)
AST: 15 U/L (ref 10–30)
Albumin: 4.8 g/dL (ref 3.6–5.1)
Alkaline phosphatase (APISO): 62 U/L (ref 31–125)
BUN/Creatinine Ratio: 16 (calc) (ref 6–22)
BUN: 20 mg/dL (ref 7–25)
CO2: 26 mmol/L (ref 20–32)
Calcium: 10 mg/dL (ref 8.6–10.2)
Chloride: 106 mmol/L (ref 98–110)
Creat: 1.27 mg/dL — ABNORMAL HIGH (ref 0.50–0.97)
Globulin: 2.7 g/dL (calc) (ref 1.9–3.7)
Glucose, Bld: 95 mg/dL (ref 65–99)
Potassium: 4.3 mmol/L (ref 3.5–5.3)
Sodium: 141 mmol/L (ref 135–146)
Total Bilirubin: 0.2 mg/dL (ref 0.2–1.2)
Total Protein: 7.5 g/dL (ref 6.1–8.1)
eGFR: 56 mL/min/{1.73_m2} — ABNORMAL LOW (ref >=60)

## 2023-01-03 LAB — CBC WITH DIFFERENTIAL/PLATELET
Absolute Monocytes: 442 cells/uL (ref 200–950)
Basophils Absolute: 47 cells/uL (ref 0–200)
Basophils Relative: 0.7 %
Eosinophils Absolute: 87 cells/uL (ref 15–500)
Eosinophils Relative: 1.3 %
HCT: 40 % (ref 35.0–45.0)
Hemoglobin: 13.6 g/dL (ref 11.7–15.5)
Lymphs Abs: 2318 cells/uL (ref 850–3900)
MCH: 30.8 pg (ref 27.0–33.0)
MCHC: 34 g/dL (ref 32.0–36.0)
MCV: 90.7 fL (ref 80.0–100.0)
MPV: 10.6 fL (ref 7.5–12.5)
Monocytes Relative: 6.6 %
Neutro Abs: 3806 cells/uL (ref 1500–7800)
Neutrophils Relative %: 56.8 %
Platelets: 275 10*3/uL (ref 140–400)
RBC: 4.41 10*6/uL (ref 3.80–5.10)
RDW: 12.4 % (ref 11.0–15.0)
Total Lymphocyte: 34.6 %
WBC: 6.7 10*3/uL (ref 3.8–10.8)

## 2023-01-03 LAB — RPR: RPR Ser Ql: NONREACTIVE

## 2023-01-03 LAB — HIV-1 RNA QUANT-NO REFLEX-BLD
HIV 1 RNA Quant: NOT DETECTED Copies/mL
HIV-1 RNA Quant, Log: NOT DETECTED Log cps/mL

## 2023-01-03 LAB — HIV RNA, RTPCR W/R GT (RTI, PI,INT)
HIV 1 RNA Quant: <20 copies/mL — AB
HIV-1 RNA Quant, Log: <1.3 Log copies/mL — AB

## 2023-01-03 LAB — LIPID PANEL
Cholesterol: 192 mg/dL (ref <200)
HDL: 70 mg/dL (ref >=50)
LDL Cholesterol (Calc): 104 mg/dL (calc) — ABNORMAL HIGH
Non-HDL Cholesterol (Calc): 122 mg/dL (calc) (ref <130)
Total CHOL/HDL Ratio: 2.7 (calc) (ref <5.0)
Triglycerides: 87 mg/dL (ref <150)

## 2023-01-15 ENCOUNTER — Encounter: Payer: 59 | Admitting: Nurse Practitioner

## 2023-01-30 ENCOUNTER — Ambulatory Visit (INDEPENDENT_AMBULATORY_CARE_PROVIDER_SITE_OTHER): Payer: 59 | Admitting: Physician Assistant

## 2023-01-30 ENCOUNTER — Encounter (INDEPENDENT_AMBULATORY_CARE_PROVIDER_SITE_OTHER): Payer: Self-pay | Admitting: Physician Assistant

## 2023-01-30 VITALS — BP 101/67 | HR 69 | Temp 98.2°F | Ht 75.0 in | Wt 251.0 lb

## 2023-01-30 DIAGNOSIS — F3289 Other specified depressive episodes: Secondary | ICD-10-CM

## 2023-01-30 DIAGNOSIS — E669 Obesity, unspecified: Secondary | ICD-10-CM | POA: Diagnosis not present

## 2023-01-30 DIAGNOSIS — Z6831 Body mass index (BMI) 31.0-31.9, adult: Secondary | ICD-10-CM

## 2023-01-30 DIAGNOSIS — E559 Vitamin D deficiency, unspecified: Secondary | ICD-10-CM | POA: Diagnosis not present

## 2023-01-30 DIAGNOSIS — R7303 Prediabetes: Secondary | ICD-10-CM | POA: Diagnosis not present

## 2023-01-30 MED ORDER — TOPIRAMATE 50 MG PO TABS: 50.0000 mg | ORAL_TABLET | Freq: Every day | ORAL | 0 refills | Status: DC

## 2023-01-30 MED ORDER — BUPROPION HCL ER (XL) 300 MG PO TB24: 300.0000 mg | ORAL_TABLET | Freq: Every day | ORAL | 0 refills | Status: DC

## 2023-01-30 MED ORDER — VITAMIN D (ERGOCALCIFEROL) 1.25 MG (50000 UNIT) PO CAPS: 50000.0000 [IU] | ORAL_CAPSULE | ORAL | 0 refills | Status: DC

## 2023-01-30 NOTE — Assessment & Plan Note (Signed)
Vitamin D Deficiency Vitamin D is not at goal of 50.  Most recent vitamin D level was 38.3. She is on  prescription ergocalciferol 50,000 IU weekly. Lab Results  Component Value Date   VD25OH 38.3 10/17/2022   VD25OH 27.7 (L) 06/04/2022    Plan: Continue and refill  prescription ergocalciferol 50,000 IU weekly Low vitamin D levels can be associated with adiposity and may result in leptin resistance and weight gain. Also associated with fatigue. Currently on vitamin D supplementation without any adverse effects.  Plan to recheck vitamin D level next visit.

## 2023-01-30 NOTE — Assessment & Plan Note (Signed)
Prediabetes Last A1c was 5.8  Medication(s): None   (Topamax and Wellbutrin for cravings. ) She is working on nutrition plan to decrease simple carbohydrates, increase lean proteins and exercise to promote weight loss, improve glycemic control and prevent progression to Type 2 diabetes.   Lab Results  Component Value Date   HGBA1C 5.8 (H) 10/17/2022   HGBA1C 6.1 (H) 06/04/2022   HGBA1C 6.1 (H) 01/01/2022   Lab Results  Component Value Date   INSULIN 17.5 06/04/2022    Plan:  Continue working on nutrition plan to decrease simple carbohydrates, increase lean proteins and exercise to promote weight loss, improve glycemic control and prevent progression to Type 2 diabetes.  Current BMI 31.4.  Patient happy with current weight and more interested in making sure to prevent progression to Type 2 diabetes.  Will plan to recheck fasting labs at next visit.  Hold off on medication, metformin for now.

## 2023-01-30 NOTE — Progress Notes (Signed)
Office: [phone]  /  Fax: [phone]  WEIGHT SUMMARY AND BIOMETRICS  Vitals Temp: 98.2 F (36.8 C) BP: 101/67 Pulse Rate: 69 SpO2: 99 %   Anthropometric Measurements Height:  (1.905 m) Weight: 251 lb (113.9 kg) BMI (Calculated): 31.37 Weight at Last Visit: 249 Weight Lost Since Last Visit: 0 Weight Gained Since Last Visit: 2 lb Starting Weight: 249# Total Weight Loss (lbs): 0 lb (0 kg)   Body Composition  Body Fat %: 41 % Fat Mass (lbs): 103 lbs Muscle Mass (lbs): 140.8 lbs Total Body Water (lbs): 92.8 lbs Visceral Fat Rating : 8   Other Clinical Data Fasting: no Labs: no Today's Visit #: 11 Starting Date: 06/04/22     HPI  Chief Complaint: OBESITY  Renee Malone is here to discuss her progress with her obesity treatment plan. She is on the keeping a food journal and adhering to recommended goals of 1500 calories and 90+ protein and states she is following her eating plan approximately 0 % of the time. She states she is skating and walking 30-60-120 minutes a few times per week.   Interval History:  Since last office visit she is up 2 lbs She reports feeling happy with her shape overall, but is concerned about A1C level going back up.  Hunger/appetite- not well controlled at times, especially when using CBD. Eating rice cakes to help with hunger. Sleep- good overall.  Works 2 jobs as Loss adjuster, [employer].  Discussed some 100 calorie snack ideas and provided handout.   Pharmacotherapy: topamax for cravings. No side effects.    Wellbutrin XL 300 mg daily for cravings. No side effects (Rybelsus for insulin resistance/prediabetes was not approved by insurance/never started)   PHYSICAL EXAM:  Blood pressure 101/67, pulse 69, temperature 98.2 F (36.8 C), height  (1.905 m), weight 251 lb (113.9 kg), SpO2 99 %. Body mass index is 31.37 kg/m.  General: She is overweight, cooperative, alert, well developed, and in no acute  distress. PSYCH: Has normal mood, affect and thought process.   Cardiovascular: HR 60's regular Lungs: Normal breathing effort, no conversational dyspnea. Neuro: no focal deficits  DIAGNOSTIC DATA REVIEWED:  BMET    Component Value Date/Time   NA 141 12/31/2022 1201   NA 140 06/04/2022 0927   K 4.3 12/31/2022 1201   CL 106 12/31/2022 1201   CO2 26 12/31/2022 1201   GLUCOSE 95 12/31/2022 1201   BUN 20 12/31/2022 1201   BUN 13 06/04/2022 0927   CREATININE 1.27 (H) 12/31/2022 1201   CALCIUM 10.0 12/31/2022 1201   GFRNONAA >60 07/22/2022 1655   GFRNONAA 63 01/30/2021 0000   GFRAA 73 01/30/2021 0000   Lab Results  Component Value Date   HGBA1C 5.8 (H) 10/17/2022   HGBA1C 6.1 (H) 01/01/2022   Lab Results  Component Value Date   INSULIN 17.5 06/04/2022   Lab Results  Component Value Date   TSH 1.420 06/04/2022   CBC    Component Value Date/Time   WBC 6.7 12/31/2022 1201   RBC 4.41 12/31/2022 1201   HGB 13.6 12/31/2022 1201   HGB 13.4 06/04/2022 0927   HCT 40.0 12/31/2022 1201   HCT 39.6 06/04/2022 0927   PLT 275 12/31/2022 1201   PLT 259 06/04/2022 0927   MCV 90.7 12/31/2022 1201   MCV 92 06/04/2022 0927   MCH 30.8 12/31/2022 1201   MCHC 34.0 12/31/2022 1201   RDW 12.4 12/31/2022 1201   RDW 12.6 06/04/2022 0927  Iron Studies No results found for: "IRON", "TIBC", "FERRITIN", "IRONPCTSAT" Lipid Panel     Component Value Date/Time   CHOL 192 12/31/2022 1201   CHOL 194 06/04/2022 0927   TRIG 87 12/31/2022 1201   HDL 70 12/31/2022 1201   HDL 68 06/04/2022 0927   CHOLHDL 2.7 12/31/2022 1201   VLDL 31 (H) 11/14/2016 1613   LDLCALC 104 (H) 12/31/2022 1201   Hepatic Function Panel     Component Value Date/Time   PROT 7.5 12/31/2022 1201   PROT 7.2 06/04/2022 0927   ALBUMIN 4.7 06/04/2022 0927   AST 15 12/31/2022 1201   ALT 13 12/31/2022 1201   ALKPHOS 66 06/04/2022 0927   BILITOT 0.2 12/31/2022 1201   BILITOT 0.2 06/04/2022 0927      Component Value  Date/Time   TSH 1.420 06/04/2022 0927   Nutritional Lab Results  Component Value Date   VD25OH 38.3 10/17/2022   VD25OH 27.7 (L) 06/04/2022    ASSOCIATED CONDITIONS ADDRESSED TODAY  ASSESSMENT AND PLAN  Problem List Items Addressed This Visit     Depression    Other depression/emotional eating Renee Malone has had issues with stress/emotional eating. Currently this is moderately controlled. Overall mood is stable. Medication(s): Bupropion XL 300 mg daily and Topiramate 50 mg at bedtime nightly. No side effects with medication.   Plan: Continue and refill Bupropion XL 300 mg daily and Topiramate 50 mg at bedtime.  Continue to work on emotional eating strategies.           Relevant Medications   buPROPion (WELLBUTRIN XL) 300 MG 24 hr tablet   topiramate (TOPAMAX) 50 MG tablet   Prediabetes - Primary    Prediabetes Last A1c was 5.8  Medication(s): None   (Topamax and Wellbutrin for cravings. ) She is working on nutrition plan to decrease simple carbohydrates, increase lean proteins and exercise to promote weight loss, improve glycemic control and prevent progression to Type 2 diabetes.   Lab Results  Component Value Date   HGBA1C 5.8 (H) 10/17/2022   HGBA1C 6.1 (H) 06/04/2022   HGBA1C 6.1 (H) 01/01/2022   Lab Results  Component Value Date   INSULIN 17.5 06/04/2022   Plan:  Continue working on nutrition plan to decrease simple carbohydrates, increase lean proteins and exercise to promote weight loss, improve glycemic control and prevent progression to Type 2 diabetes.  Current BMI 31.4.  Patient happy with current weight and more interested in making sure to prevent progression to Type 2 diabetes.  Will plan to recheck fasting labs at next visit.  Hold off on medication, metformin for now.        Vitamin D deficiency    Vitamin D Deficiency Vitamin D is not at goal of 50.  Most recent vitamin D level was 38.3. She is on  prescription ergocalciferol 50,000 IU  weekly. Lab Results  Component Value Date   VD25OH 38.3 10/17/2022   VD25OH 27.7 (L) 06/04/2022   Plan: Continue and refill  prescription ergocalciferol 50,000 IU weekly Low vitamin D levels can be associated with adiposity and may result in leptin resistance and weight gain. Also associated with fatigue. Currently on vitamin D supplementation without any adverse effects.  Plan to recheck vitamin D level next visit.        Relevant Medications   Vitamin D, Ergocalciferol, (DRISDOL) 1.25 MG (50000 UNIT) CAPS capsule   Obesity-- Start BMI 31.12   BMI 31.0-31.9,adult Current BMI 31.4   Current BMI 31.4.  Patient happy with  current weight and more interested in making sure to prevent progression to Type 2 diabetes.  Will plan to recheck fasting labs at next visit.  Hold off on medication, metformin for now.     TREATMENT PLAN FOR OBESITY:  Recommended Dietary Goals  Renee Malone is currently in the action stage of change. As such, her goal is to continue weight management plan. She has agreed to keeping a food journal and adhering to recommended goals of 1500 calories and 90+ grams of protein.  Behavioral Intervention  We discussed the following Behavioral Modification Strategies today: increasing lean protein intake, decreasing simple carbohydrates , increasing vegetables, increasing lower glycemic fruits, increasing water intake, work on tracking and journaling calories using tracking application, continue to practice mindfulness when eating, and planning for success.  Additional resources provided today: NA  Recommended Physical Activity Goals  Renee Malone has been advised to work up to 150 minutes of moderate intensity aerobic activity a week and strengthening exercises 2-3 times per week for cardiovascular health, weight loss maintenance and preservation of muscle mass.   She has agreed to Continue current level of physical activity    Pharmacotherapy We discussed various  medication options to help Renee Malone with her weight loss efforts and we both agreed to continue topamax and Wellbutrin for cravings and continue to work on nutritional and behavioral strategies to promote weight loss.  .    Return in about 4 weeks (around 02/27/2023) for Fasting Lab.Marland Kitchen She was informed of the importance of frequent follow up visits to maximize her success with intensive lifestyle modifications for her multiple health conditions.   ATTESTASTION STATEMENTS:  Reviewed by clinician on day of visit: allergies, medications, problem list, medical history, surgical history, family history, social history, and previous encounter notes.   I have personally spent 42 minutes total time today in preparation, patient care, nutritional counseling and documentation for this visit, including the following: review of clinical lab tests; review of medical tests/procedures/services.      Nupur Hohman, PA-C

## 2023-01-30 NOTE — Assessment & Plan Note (Signed)
Other depression/emotional eating Tarshia has had issues with stress/emotional eating. Currently this is moderately controlled. Overall mood is stable. Medication(s): Bupropion XL 300 mg daily and Topiramate 50 mg at bedtime nightly. No side effects with medication.   Plan: Continue and refill Bupropion XL 300 mg daily and Topiramate 50 mg at bedtime.  Continue to work on emotional eating strategies.

## 2023-01-31 ENCOUNTER — Other Ambulatory Visit (INDEPENDENT_AMBULATORY_CARE_PROVIDER_SITE_OTHER): Payer: Self-pay | Admitting: Physician Assistant

## 2023-01-31 DIAGNOSIS — F3289 Other specified depressive episodes: Secondary | ICD-10-CM

## 2023-02-11 ENCOUNTER — Emergency Department (HOSPITAL_BASED_OUTPATIENT_CLINIC_OR_DEPARTMENT_OTHER)
Admission: EM | Admit: 2023-02-11 | Discharge: 2023-02-11 | Disposition: A | Discharge status: Home / Self Care | Payer: 59 | Attending: Emergency Medicine | Admitting: Emergency Medicine

## 2023-02-11 ENCOUNTER — Other Ambulatory Visit: Payer: Self-pay

## 2023-02-11 ENCOUNTER — Encounter (HOSPITAL_BASED_OUTPATIENT_CLINIC_OR_DEPARTMENT_OTHER): Payer: Self-pay | Admitting: Emergency Medicine

## 2023-02-11 DIAGNOSIS — W57XXXA Bitten or stung by nonvenomous insect and other nonvenomous arthropods, initial encounter: Secondary | ICD-10-CM | POA: Diagnosis not present

## 2023-02-11 DIAGNOSIS — S2096XA Insect bite (nonvenomous) of unspecified parts of thorax, initial encounter: Secondary | ICD-10-CM

## 2023-02-11 DIAGNOSIS — S20462A Insect bite (nonvenomous) of left back wall of thorax, initial encounter: Secondary | ICD-10-CM | POA: Insufficient documentation

## 2023-02-11 MED ORDER — DOXYCYCLINE HYCLATE 100 MG PO CAPS: 100.0000 mg | ORAL_CAPSULE | Freq: Two times a day (BID) | ORAL | 0 refills | Status: AC

## 2023-02-11 MED ORDER — DOXYCYCLINE HYCLATE 100 MG PO TABS
100.0000 mg | ORAL_TABLET | Freq: Once | ORAL | Status: AC
  Administered 2023-02-11: 100 mg via ORAL
  Filled 2023-02-11: qty 1

## 2023-02-11 NOTE — ED Triage Notes (Signed)
Tick found and removed from left upper back. Saturday. Now rash, red area

## 2023-02-11 NOTE — Discharge Instructions (Signed)
Please follow-up with your primary care provider regarding recent symptoms and ER visit.  Today on exam I did not notice any rash however I did see where the tick had been and after we discussed we agreed to give 1 dose of the antibiotic in the ER before discharge and to prescribe you antibiotic as well.  Please monitor your symptoms and look for target shaped rash in the area of the tick bite.  Please take the antibiotics as prescribed and if symptoms worsen please return to ER.

## 2023-02-11 NOTE — ED Notes (Signed)
RN provided AVS using Teachback Method. Patient verbalizes understanding of Discharge Instructions. Opportunity for Questioning and Answers were provided by RN. Patient Discharged from ED ambulatory to home via Self. 

## 2023-02-11 NOTE — ED Provider Notes (Signed)
Renee Malone Provider Note   CSN: [id number] Arrival date & time: 02/11/23  2103     History  Chief Complaint  Patient presents with   Tick Removal    Renee Malone is a 37 y.o. adult presented with a rash with a tick bite.  Patient states that she has dogs in the house that have attracted ticks this season.  Patient noted that 5 days ago she had a tick in her left flank that she thought was a scab and that 3 days later she realized it was a tick and had her husband remove it.  Patient believes she is seeing a rash in the area and wants to be evaluated for it.  Patient denies any fevers, nausea/vomiting, joint pain, chest pain, shortness of breath.   Home Medications Prior to Admission medications   Medication Sig Start Date End Date Taking? Authorizing Provider  doxycycline (VIBRAMYCIN) 100 MG capsule Take 1 capsule (100 mg total) by mouth 2 (two) times daily for 10 days. 02/11/23 02/21/23 Yes Cortlyn Cannell, Beverly Gust, PA-C  ALPRAZolam (XANAX) 1 MG tablet TAKE ONE TABLET BY MOUTH EVERY 8 HOURS AS NEEDED Patient not taking: No sig reported 01/10/12   Cliffton Asters, MD  bictegravir-emtricitabine-tenofovir AF (BIKTARVY) 50-200-25 MG TABS tablet Take 1 tablet by mouth daily. 12/31/22   Randall Hiss, MD  buPROPion (WELLBUTRIN XL) 300 MG 24 hr tablet Take 1 tablet (300 mg total) by mouth daily. 01/30/23   Rayburn, Fanny Bien, PA-C  estradiol (ESTRACE) 2 MG tablet Take 1 tablet (2 mg total) by mouth daily. 08/15/22   Shamleffer, Konrad Dolores, MD  hydrocortisone 1 % ointment Apply 1 application topically 2 (two) times daily. 05/27/19   Randall Hiss, MD  topiramate (TOPAMAX) 50 MG tablet Take 1 tablet (50 mg total) by mouth daily. Take 1/2 tab at bedtime for the first 2 weeks and if tolerated, increase to 50 mg at bedtime 01/30/23   Rayburn, Fanny Bien, PA-C  Vitamin D, Ergocalciferol, (DRISDOL) 1.25 MG (50000 UNIT) CAPS capsule  Take 1 capsule (50,000 Units total) by mouth every 7 (seven) days. 01/30/23   Rayburn, Fanny Bien, PA-C      Allergies    Cy Blamer [place] oleracea] and Other    Review of Systems   Review of Systems See HPI Physical Exam Updated Vital Signs BP (!) 123/93   Pulse 63   Temp 98 F (36.7 C) (Oral)   Resp 18   SpO2 100%  Physical Exam Constitutional:      General: She is not in acute distress. Cardiovascular:     Rate and Rhythm: Normal rate and regular rhythm.     Pulses: Normal pulses.     Heart sounds: Normal heart sounds. No murmur heard. Pulmonary:     Effort: Pulmonary effort is normal. No respiratory distress.     Breath sounds: Normal breath sounds.  Abdominal:     Palpations: Abdomen is soft.     Tenderness: There is no abdominal tenderness. There is no guarding or rebound.  Musculoskeletal:        General: Normal range of motion.     Cervical back: Normal range of motion.  Skin:    Comments: Left flank: No rash noted however able to see the tick had been, nontender to palpation  Neurological:     Mental Status: She is alert.  Psychiatric:        Mood and Affect: Mood normal.  ED Results / Procedures / Treatments   Labs (all labs ordered are listed, but only abnormal results are displayed) Labs Reviewed - No data to display  EKG None  Radiology No results found.  Procedures Procedures    Medications Ordered in ED Medications  doxycycline (VIBRA-TABS) tablet 100 mg (100 mg Oral Given 02/11/23 2257)    ED Course/ Medical Decision Making/ A&P                             Medical Decision Making  Renee Malone 37 y.o. presented today for tick rash. Working DDx that I considered at this time includes, but not limited to, Lyme disease, heart block, myalgias, cellulitis, abscess,.  R/o DDx: Lyme disease, heart block, myalgias, cellulitis, abscess: These are considered less likely due to history of present illness and physical exam  findings  Review of prior external notes: 12/25/2022 ED  Unique Tests and My Interpretation: None  Discussion with Independent Historian: None  Discussion of Management of Tests: None  Risk: Medium: prescription drug management  Risk Stratification Score: None  Plan: Patient presented for tick bite with rash. On exam patient was in no acute distress and stable vitals.  On exam I could see where the tick had been however I did not note any rash in the area.  Patient did not endorse any systemic symptoms however is immunocompromise with HIV with undetectable viral load and elevated CD4 count from 12/31/2022.  I spoke to the patient about the risks and benefit of starting antibiotics and patient stated that she would like the antibiotics and does not feel that labs or an EKG are necessary at this time.  Patient will be given doxycycline as she does not have a documented allergy encouraged follow-up with her primary care provider.  Patient given 1 dose of doxycycline in the ER before discharge.  Patient was given return precautions. Patient stable for discharge at this time.  Patient verbalized understanding of plan.         Final Clinical Impression(s) / ED Diagnoses Final diagnoses:  Tick bite of thoracic wall, unspecified whether front or back, initial encounter    Rx / DC Orders ED Discharge Orders          Ordered    doxycycline (VIBRAMYCIN) 100 MG capsule  2 times daily        02/11/23 2253              Remi Deter 02/11/23 2326    Linwood Dibbles, MD 02/11/23 2326

## 2023-02-16 ENCOUNTER — Other Ambulatory Visit: Payer: Self-pay | Admitting: Internal Medicine

## 2023-02-28 ENCOUNTER — Ambulatory Visit (INDEPENDENT_AMBULATORY_CARE_PROVIDER_SITE_OTHER): Payer: 59 | Admitting: Physician Assistant

## 2023-02-28 ENCOUNTER — Encounter (INDEPENDENT_AMBULATORY_CARE_PROVIDER_SITE_OTHER): Payer: Self-pay | Admitting: Physician Assistant

## 2023-02-28 VITALS — BP 120/84 | HR 65 | Temp 98.5°F | Ht 75.0 in | Wt 253.0 lb

## 2023-02-28 DIAGNOSIS — E559 Vitamin D deficiency, unspecified: Secondary | ICD-10-CM

## 2023-02-28 DIAGNOSIS — R7303 Prediabetes: Secondary | ICD-10-CM

## 2023-02-28 DIAGNOSIS — R5383 Other fatigue: Secondary | ICD-10-CM

## 2023-02-28 DIAGNOSIS — E669 Obesity, unspecified: Secondary | ICD-10-CM

## 2023-02-28 DIAGNOSIS — F3289 Other specified depressive episodes: Secondary | ICD-10-CM

## 2023-02-28 DIAGNOSIS — Z6831 Body mass index (BMI) 31.0-31.9, adult: Secondary | ICD-10-CM

## 2023-02-28 MED ORDER — BUPROPION HCL ER (XL) 300 MG PO TB24: 300.0000 mg | ORAL_TABLET | Freq: Every day | ORAL | 0 refills | Status: DC

## 2023-02-28 MED ORDER — VITAMIN D (ERGOCALCIFEROL) 1.25 MG (50000 UNIT) PO CAPS
50000.0000 [IU] | ORAL_CAPSULE | ORAL | 0 refills | Status: DC
Start: 2023-02-28 — End: 2023-03-28

## 2023-02-28 MED ORDER — TOPIRAMATE 50 MG PO TABS: 50.0000 mg | ORAL_TABLET | Freq: Every day | ORAL | 0 refills | Status: DC

## 2023-02-28 NOTE — Progress Notes (Signed)
.[facility]  Office: [phone]  /  Fax: [phone]  WEIGHT SUMMARY AND BIOMETRICS  No data recorded  No data recorded  No data recorded  No data recorded    HPI  Chief Complaint: OBESITY  Renee Malone is here to discuss her progress with her obesity treatment plan. She is on the keeping a food journal and adhering to recommended goals of 1500 calories and 90 protein and states she is following her eating plan approximately 0 % of the time. She states she is exercising 30 minutes 1-2 times per week.   Interval History:  Since last office visit she is up 2 lbs.  Reports baking a lot lately- Cakes, other sweets. We discuss Splenda recipe site if feeling needs something sweet, but also discussed starting medication back for cravings as had stopped topamax. Continues to take Wellbutrin as well.  Hunger/appetite-controlled and gets adequate protein, not skipping meals Cravings- increased lately. Was not taking topamax as was too sleepy and was not waking up with alarm and felt tired. Discussed starting back at lower dose.  Stress- levels remain high due to work stress. 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  Sleep- 6 hrs at most off topamax and we discussed starting back on topamax today.  Exercise-walking a lot with work, not exercising as regularly as before.  Hydration-adequate.    Pharmacotherapy: Topamax for cravings. Patient denies history of glaucoma or kidney stones. She was informed of side effects and possible risks and benefits and would like to restart topamax.  Wellbutrin XL 300 mg daily for cravings as well. No side effects with Wellbutrin.    TREATMENT PLAN FOR OBESITY:  Recommended Dietary Goals  [name] is currently in the action stage of change. As such, her goal is to continue weight management plan. She has agreed to keeping a food journal and adhering to recommended goals of  1500 calories and 90+ grams of protein.  Behavioral Intervention  We discussed the following Behavioral Modification Strategies today: increasing lean protein intake, decreasing simple carbohydrates , increasing vegetables, increasing lower glycemic fruits, increasing water intake, work on tracking and journaling calories using tracking application, emotional eating strategies and understanding the difference between hunger signals and cravings, continue to practice mindfulness when eating, and planning for success.  Additional resources provided today: NA  Recommended Physical Activity Goals  [name] has been advised to work up to 150 minutes of moderate intensity aerobic activity a week and strengthening exercises 2-3 times per week for cardiovascular health, weight loss maintenance and preservation of muscle mass.   She has agreed to Continue current level of physical activity  and Think about ways to increase daily physical activity and overcoming barriers to exercise   Pharmacotherapy We discussed various medication options to help Estalee with her weight loss efforts and we both agreed to resume topamax and continue Wellbutrin for emotional eating/cravings.    Return in about 4 weeks (around 03/28/2023).Marland Kitchen She was informed of the importance of frequent follow up visits to maximize her success with intensive lifestyle modifications for her multiple health conditions.  PHYSICAL EXAM:  Blood pressure 120/84, pulse 65, temperature 98.5 F (36.9 C), height 6\' 3"  (1.905 m), weight 253 lb (114.8 kg), SpO2 98 %. Body mass index is 31.62 kg/m.  General: She is overweight, cooperative, alert, well developed, and in no acute distress. PSYCH: Has normal mood, affect and thought process.   Cardiovascular: HR 60's regular  Lungs: Normal breathing effort, no conversational dyspnea. Neuro: no focal deficits  DIAGNOSTIC DATA REVIEWED:  BMET    Component Value Date/Time   NA 141 02/28/2023  0920   K 4.8 02/28/2023 0920   CL 102 02/28/2023 0920   CO2 23 02/28/2023 0920   GLUCOSE 91 02/28/2023 0920   GLUCOSE 95 12/31/2022 1201   BUN 14 02/28/2023 0920   CREATININE 1.14 (H) 02/28/2023 0920   CREATININE 1.27 (H) 12/31/2022 1201   CALCIUM 10.0 02/28/2023 0920   GFRNONAA >60 07/22/2022 1655   GFRNONAA 63 01/30/2021 0000   GFRAA 73 01/30/2021 0000   Lab Results  Component Value Date   HGBA1C 6.1 (H) 02/28/2023   HGBA1C 6.1 (H) 01/01/2022   Lab Results  Component Value Date   INSULIN 15.9 02/28/2023   INSULIN 17.5 06/04/2022   Lab Results  Component Value Date   TSH 1.560 02/28/2023   CBC    Component Value Date/Time   WBC 5.0 02/28/2023 0920   WBC 6.7 12/31/2022 1201   RBC 4.23 02/28/2023 0920   RBC 4.41 12/31/2022 1201   HGB 13.1 02/28/2023 0920   HCT 39.1 02/28/2023 0920   PLT 220 02/28/2023 0920   MCV 92 02/28/2023 0920   MCH 31.0 02/28/2023 0920   MCH 30.8 12/31/2022 1201   MCHC 33.5 02/28/2023 0920   MCHC 34.0 12/31/2022 1201   RDW 12.8 02/28/2023 0920   Iron Studies No results found for: "IRON", "TIBC", "FERRITIN", "IRONPCTSAT" Lipid Panel     Component Value Date/Time   CHOL 202 (H) 02/28/2023 0920   TRIG 98 02/28/2023 0920   HDL 75 02/28/2023 0920   CHOLHDL 2.7 12/31/2022 1201   VLDL 31 (H) 11/14/2016 1613   LDLCALC 110 (H) 02/28/2023 0920   LDLCALC 104 (H) 12/31/2022 1201   Hepatic Function Panel     Component Value Date/Time   PROT 7.2 02/28/2023 0920   ALBUMIN 4.6 02/28/2023 0920   AST 21 02/28/2023 0920   ALT 17 02/28/2023 0920   ALKPHOS 56 02/28/2023 0920   BILITOT 0.3 02/28/2023 0920      Component Value Date/Time   TSH 1.560 02/28/2023 0920   Nutritional Lab Results  Component Value Date   VD25OH 52.5 02/28/2023   VD25OH 38.3 10/17/2022   VD25OH 27.7 (L) 06/04/2022    ASSOCIATED CONDITIONS ADDRESSED TODAY  ASSESSMENT AND PLAN  Problem List Items Addressed This Visit     Depression   Relevant Medications    buPROPion (WELLBUTRIN XL) 300 MG 24 hr tablet   topiramate (TOPAMAX) 50 MG tablet   Other fatigue   Relevant Orders   Vitamin B12 (Completed)   CBC with Differential/Platelet (Completed)   TSH (Completed)   Prediabetes - Primary   Relevant Orders   CMP14+EGFR (Completed)   Hemoglobin A1c (Completed)   Insulin, random (Completed)   Lipid Panel With LDL/HDL Ratio (Completed)   Vitamin D deficiency   Relevant Medications   Vitamin D, Ergocalciferol, (DRISDOL) 1.25 MG (50000 UNIT) CAPS capsule   Other Relevant Orders   VITAMIN D 25 Hydroxy (Vit-D Deficiency, Fractures) (Completed)   Obesity-- Start BMI 31.12   BMI 31.0-31.9,adult Current BMI 31.4   Prediabetes Last A1c was 5.8. / Insulin 15.9- not at goals.   Medication(s): None Polyphagia:Yes -Taking wellbutrin and to resume topamax for cravings.  She is working on Engineer, technical sales to decrease simple carbohydrates, increase lean proteins and exercise to promote weight loss, improve glycemic control and prevent progression to Type 2 diabetes.  Lab Results  Component Value Date   HGBA1C 6.1 (H) 02/28/2023   HGBA1C 5.8 (H) 10/17/2022   HGBA1C 6.1 (H) 06/04/2022   HGBA1C 6.1 (H) 01/01/2022   Lab Results  Component Value Date   INSULIN 15.9 02/28/2023   INSULIN 17.5 06/04/2022    Plan:  Continue working on nutrition plan to decrease simple carbohydrates, increase lean proteins and exercise to promote weight loss, improve glycemic control and prevent progression to Type 2 diabetes.  Recheck fasting labs today.  ID has okayed for metformin in past while taking Biktarvy, but the patient has wanted to continue to work on nutrition and exercise at this point.   Vitamin D Deficiency Vitamin D is at goal of 50.  Most recent vitamin D level was 52.5. She is on  prescription ergocalciferol 50,000 IU weekly. No N/V or muscle weakness or other side effects with Ergocalciferol.  Lab Results  Component Value Date   VD25OH 52.5 02/28/2023    VD25OH 38.3 10/17/2022   VD25OH 27.7 (L) 06/04/2022    Plan: Continue and refill  prescription ergocalciferol 50,000 IU weekly Recheck vitamin D level today and adjust medication accordingly.  Low vitamin D levels can be associated with adiposity and may result in leptin resistance and weight gain. Also associated with fatigue. Currently on vitamin D supplementation without any adverse effects.   Fatigue:  Noelie does feel that her energy to be lower than it should be. Fatigue may be related to obesity, depression or many other causes. Labs will be ordered, and in the meanwhile, Talulah will focus on self care including making healthy food choices, increasing physical activity and focusing on stress reduction. Recheck labs today.   Other depression/emotional eating Xela has had issues with stress/emotional eating. Currently this is poorly controlled. Overall mood is stable. Medication(s): Bupropion XL 300 mg daily  Plan: Continue and refill Bupropion XL 300 mg daily and resume/refill topamax and start at 25 mg daily and increase to 50 mg daily at bedtime if tolerated and not feeling too sleepy or fatigued the following day. Will monitor response with cravings back on both medications.   ATTESTASTION STATEMENTS:  Reviewed by clinician on day of visit: allergies, medications, problem list, medical history, surgical history, family history, social history, and previous encounter notes.   I have personally spent 40 minutes total time today in preparation, patient care, nutritional counseling and documentation for this visit, including the following: review of clinical lab tests; review of medical tests/procedures/services.      Jannatul Wojdyla, PA-C

## 2023-03-01 LAB — TSH: TSH: 1.56 u[IU]/mL (ref 0.450–4.500)

## 2023-03-01 LAB — CBC WITH DIFFERENTIAL/PLATELET
Basophils Absolute: 0.1 10*3/uL (ref 0.0–0.2)
Basos: 1 %
EOS (ABSOLUTE): 0.1 10*3/uL (ref 0.0–0.4)
Eos: 2 %
Hematocrit: 39.1 % (ref 34.0–46.6)
Hemoglobin: 13.1 g/dL (ref 11.1–15.9)
Immature Grans (Abs): 0 10*3/uL (ref 0.0–0.1)
Immature Granulocytes: 0 %
Lymphocytes Absolute: 1.9 10*3/uL (ref 0.7–3.1)
Lymphs: 38 %
MCH: 31 pg (ref 26.6–33.0)
MCHC: 33.5 g/dL (ref 31.5–35.7)
MCV: 92 fL (ref 79–97)
Monocytes Absolute: 0.4 10*3/uL (ref 0.1–0.9)
Monocytes: 9 %
Neutrophils Absolute: 2.5 10*3/uL (ref 1.4–7.0)
Neutrophils: 50 %
Platelets: 220 10*3/uL (ref 150–450)
RBC: 4.23 x10E6/uL (ref 3.77–5.28)
RDW: 12.8 % (ref 11.7–15.4)
WBC: 5 10*3/uL (ref 3.4–10.8)

## 2023-03-01 LAB — CMP14+EGFR
ALT: 17 IU/L (ref 0–32)
AST: 21 IU/L (ref 0–40)
Albumin/Globulin Ratio: 1.8 (ref 1.2–2.2)
Albumin: 4.6 g/dL (ref 3.9–4.9)
Alkaline Phosphatase: 56 IU/L (ref 44–121)
BUN/Creatinine Ratio: 12 (ref 9–23)
BUN: 14 mg/dL (ref 6–20)
Bilirubin Total: 0.3 mg/dL (ref 0.0–1.2)
CO2: 23 mmol/L (ref 20–29)
Calcium: 10 mg/dL (ref 8.7–10.2)
Chloride: 102 mmol/L (ref 96–106)
Creatinine, Ser: 1.14 mg/dL — ABNORMAL HIGH (ref 0.57–1.00)
Globulin, Total: 2.6 g/dL (ref 1.5–4.5)
Glucose: 91 mg/dL (ref 70–99)
Potassium: 4.8 mmol/L (ref 3.5–5.2)
Sodium: 141 mmol/L (ref 134–144)
Total Protein: 7.2 g/dL (ref 6.0–8.5)
eGFR: 63 mL/min/{1.73_m2} (ref >59)

## 2023-03-01 LAB — LIPID PANEL WITH LDL/HDL RATIO
Cholesterol, Total: 202 mg/dL — ABNORMAL HIGH (ref 100–199)
HDL: 75 mg/dL (ref >39)
LDL Chol Calc (NIH): 110 mg/dL — ABNORMAL HIGH (ref 0–99)
LDL/HDL Ratio: 1.5 ratio (ref 0.0–3.2)
Triglycerides: 98 mg/dL (ref 0–149)
VLDL Cholesterol Cal: 17 mg/dL (ref 5–40)

## 2023-03-01 LAB — INSULIN, RANDOM: INSULIN: 15.9 u[IU]/mL (ref 2.6–24.9)

## 2023-03-01 LAB — VITAMIN D 25 HYDROXY (VIT D DEFICIENCY, FRACTURES): Vit D, 25-Hydroxy: 52.5 ng/mL (ref 30.0–100.0)

## 2023-03-01 LAB — VITAMIN B12: Vitamin B-12: 785 pg/mL (ref 232–1245)

## 2023-03-01 LAB — HEMOGLOBIN A1C
Est. average glucose Bld gHb Est-mCnc: 128 mg/dL
Hgb A1c MFr Bld: 6.1 % — ABNORMAL HIGH (ref 4.8–5.6)

## 2023-03-05 ENCOUNTER — Other Ambulatory Visit (INDEPENDENT_AMBULATORY_CARE_PROVIDER_SITE_OTHER): Payer: Self-pay | Admitting: Physician Assistant

## 2023-03-05 DIAGNOSIS — F3289 Other specified depressive episodes: Secondary | ICD-10-CM

## 2023-03-25 ENCOUNTER — Telehealth: Payer: Self-pay | Admitting: Internal Medicine

## 2023-03-25 NOTE — Telephone Encounter (Signed)
Renee Malone needs to be seen prior to refills

## 2023-03-25 NOTE — Telephone Encounter (Signed)
Patient requesting a Refill on her  Esteradol 2mg . Please advise

## 2023-03-26 NOTE — Telephone Encounter (Signed)
Attempted to call patient and number is incorrect.

## 2023-03-28 ENCOUNTER — Ambulatory Visit (INDEPENDENT_AMBULATORY_CARE_PROVIDER_SITE_OTHER): Payer: 59 | Admitting: Physician Assistant

## 2023-03-28 ENCOUNTER — Other Ambulatory Visit (INDEPENDENT_AMBULATORY_CARE_PROVIDER_SITE_OTHER): Payer: Self-pay | Admitting: Physician Assistant

## 2023-03-28 ENCOUNTER — Encounter (INDEPENDENT_AMBULATORY_CARE_PROVIDER_SITE_OTHER): Payer: Self-pay | Admitting: Physician Assistant

## 2023-03-28 VITALS — BP 109/75 | HR 63 | Temp 98.0°F | Ht 75.0 in | Wt 253.0 lb

## 2023-03-28 DIAGNOSIS — E7849 Other hyperlipidemia: Secondary | ICD-10-CM

## 2023-03-28 DIAGNOSIS — Z6831 Body mass index (BMI) 31.0-31.9, adult: Secondary | ICD-10-CM

## 2023-03-28 DIAGNOSIS — E669 Obesity, unspecified: Secondary | ICD-10-CM

## 2023-03-28 DIAGNOSIS — E559 Vitamin D deficiency, unspecified: Secondary | ICD-10-CM | POA: Diagnosis not present

## 2023-03-28 DIAGNOSIS — F3289 Other specified depressive episodes: Secondary | ICD-10-CM | POA: Diagnosis not present

## 2023-03-28 DIAGNOSIS — R7303 Prediabetes: Secondary | ICD-10-CM

## 2023-03-28 MED ORDER — TOPIRAMATE 50 MG PO TABS: 50.0000 mg | ORAL_TABLET | Freq: Every day | ORAL | 0 refills | Status: DC

## 2023-03-28 MED ORDER — VITAMIN D (ERGOCALCIFEROL) 1.25 MG (50000 UNIT) PO CAPS
50000.0000 [IU] | ORAL_CAPSULE | ORAL | 0 refills | Status: DC
Start: 2023-03-28 — End: 2023-05-28

## 2023-03-28 MED ORDER — BUPROPION HCL ER (XL) 300 MG PO TB24
300.0000 mg | ORAL_TABLET | Freq: Every day | ORAL | 0 refills | Status: DC
Start: 2023-03-28 — End: 2023-05-28

## 2023-03-28 NOTE — Progress Notes (Signed)
.smr  Office: [phone]  /  Fax: [phone]  WEIGHT SUMMARY AND BIOMETRICS  Vitals Temp: 98 F (36.7 C) BP: 109/75 Pulse Rate: 63 SpO2: 97 %   Anthropometric Measurements Height: 6\' 3"  (1.905 m) Weight: 253 lb (114.8 kg) BMI (Calculated): 31.62 Weight at Last Visit: 253 lb Weight Lost Since Last Visit: 0 Weight Gained Since Last Visit: 0 Starting Weight: 249 lb   Body Composition  Body Fat %: 40.9 % Fat Mass (lbs): 103.6 lbs Muscle Mass (lbs): 142 lbs Total Body Water (lbs): 94 lbs Visceral Fat Rating : 8   Other Clinical Data Fasting: Yes Labs: No Today's Visit #: 13 Starting Date: 06/04/22     HPI  Chief Complaint: OBESITY  Renee Malone is here to discuss her progress with her obesity treatment plan. She is on the keeping a food journal and adhering to recommended goals of 1500 calories and 90 grams protein and states she is following her eating plan approximately 70 % of the time. She states she is exercising walking/using step monitor throughout the day minutes 5 times per week.   Interval History:  Since last office visit she has maintained. Struggling to journal and discussed trying to journal at least 2-3 days weekly to provide more information about overall calories and protein intake.   Hunger/appetite-better control when eating on plan. Fair adherence.  Cravings-still craving sweets but doing better with snacking overall Stress-some increased work stress as has applied for another position at her current company. Sleep-good and restorative overall Exercise-walking more consistently and getting nearly 5000 steps daily 5 days a week Has been eating a sausage McMuffin and the oatmeal For breakfast from [employer].  We discussed that this is going over the calorie goals for breakfast at an average of 720 cal and to keep the calories of breakfast closer to 400-500.  Discussed some healthier options such as  Too Good yogurt for higher protein snack and a  lower calorie and lower sugar snack.   Pharmacotherapy: topamax and wellbutrin for cravings/emotional eating behavior.  Rybelsus was denied by insurance. Has wanted to hold off on metformin- Okayed by Infectious Disease with patient taking Biktarvy.   TREATMENT PLAN FOR OBESITY:  Recommended Dietary Goals  Amaryah is currently in the action stage of change. As such, her goal is to continue weight management plan. She has agreed to keeping a food journal and adhering to recommended goals of 1500 calories and 90 grams protein.  Behavioral Intervention  We discussed the following Behavioral Modification Strategies today: increasing lean protein intake, decreasing simple carbohydrates , increasing vegetables, increasing lower glycemic fruits, increasing water intake, work on tracking and journaling calories using tracking application, emotional eating strategies and understanding the difference between hunger signals and cravings, work on managing stress, creating time for self-care and relaxation measures, continue to practice mindfulness when eating, planning for success, and better snacking choices.  Additional resources provided today: NA  Recommended Physical Activity Goals  Arlita has been advised to work up to 150 minutes of moderate intensity aerobic activity a week and strengthening exercises 2-3 times per week for cardiovascular health, weight loss maintenance and preservation of muscle mass.   She has agreed to Continue current level of physical activity    Pharmacotherapy We discussed various medication options to help Mirakle with her weight loss efforts and we both agreed to continue topamax and wellbutrin for emotional eating/cravings.    Return in about 4 weeks (around 04/25/2023).Marland Kitchen She was informed of the importance of frequent  follow up visits to maximize her success with intensive lifestyle modifications for her multiple health conditions.  PHYSICAL EXAM:  Blood  pressure 109/75, pulse 63, temperature 98 F (36.7 C), height 6\' 3"  (1.905 m), weight 253 lb (114.8 kg), SpO2 97 %. Body mass index is 31.62 kg/m.  General: She is overweight, cooperative, alert, well developed, and in no acute distress. PSYCH: Has normal mood, affect and thought process.   Cardiovascular: HR 60's regular, BP 109/75 Lungs: Normal breathing effort, no conversational dyspnea. Neuro: no focal deficits  DIAGNOSTIC DATA REVIEWED:  BMET    Component Value Date/Time   NA 141 02/28/2023 0920   K 4.8 02/28/2023 0920   CL 102 02/28/2023 0920   CO2 23 02/28/2023 0920   GLUCOSE 91 02/28/2023 0920   GLUCOSE 95 12/31/2022 1201   BUN 14 02/28/2023 0920   CREATININE 1.14 (H) 02/28/2023 0920   CREATININE 1.27 (H) 12/31/2022 1201   CALCIUM 10.0 02/28/2023 0920   GFRNONAA >60 07/22/2022 1655   GFRNONAA 63 01/30/2021 0000   GFRAA 73 01/30/2021 0000   Lab Results  Component Value Date   HGBA1C 6.1 (H) 02/28/2023   HGBA1C 6.1 (H) 01/01/2022   Lab Results  Component Value Date   INSULIN 15.9 02/28/2023   INSULIN 17.5 06/04/2022   Lab Results  Component Value Date   TSH 1.560 02/28/2023   CBC    Component Value Date/Time   WBC 5.0 02/28/2023 0920   WBC 6.7 12/31/2022 1201   RBC 4.23 02/28/2023 0920   RBC 4.41 12/31/2022 1201   HGB 13.1 02/28/2023 0920   HCT 39.1 02/28/2023 0920   PLT 220 02/28/2023 0920   MCV 92 02/28/2023 0920   MCH 31.0 02/28/2023 0920   MCH 30.8 12/31/2022 1201   MCHC 33.5 02/28/2023 0920   MCHC 34.0 12/31/2022 1201   RDW 12.8 02/28/2023 0920   Iron Studies No results found for: "IRON", "TIBC", "FERRITIN", "IRONPCTSAT" Lipid Panel     Component Value Date/Time   CHOL 202 (H) 02/28/2023 0920   TRIG 98 02/28/2023 0920   HDL 75 02/28/2023 0920   CHOLHDL 2.7 12/31/2022 1201   VLDL 31 (H) 11/14/2016 1613   LDLCALC 110 (H) 02/28/2023 0920   LDLCALC 104 (H) 12/31/2022 1201   Hepatic Function Panel     Component Value Date/Time   PROT  7.2 02/28/2023 0920   ALBUMIN 4.6 02/28/2023 0920   AST 21 02/28/2023 0920   ALT 17 02/28/2023 0920   ALKPHOS 56 02/28/2023 0920   BILITOT 0.3 02/28/2023 0920      Component Value Date/Time   TSH 1.560 02/28/2023 0920   Nutritional Lab Results  Component Value Date   VD25OH 52.5 02/28/2023   VD25OH 38.3 10/17/2022   VD25OH 27.7 (L) 06/04/2022    ASSOCIATED CONDITIONS ADDRESSED TODAY  ASSESSMENT AND PLAN  Problem List Items Addressed This Visit     Depression   Relevant Medications   buPROPion (WELLBUTRIN XL) 300 MG 24 hr tablet   topiramate (TOPAMAX) 50 MG tablet   Prediabetes   Vitamin D deficiency   Relevant Medications   Vitamin D, Ergocalciferol, (DRISDOL) 1.25 MG (50000 UNIT) CAPS capsule   Obesity-- Start BMI 31.12   BMI 31.0-31.9,adult Current BMI 31.4   Other hyperlipidemia - Primary   Hyperlipidemia Labs were reviewed today and discussed with the patient.  LDL is not at goal. HDL 75 at goal/Triglycerides 98 at goal.  Medication(s): None Cardiovascular risk factors: dyslipidemia, obesity (BMI >= 30 kg/m2),  and sedentary lifestyle  Lab Results  Component Value Date   CHOL 202 (H) 02/28/2023   HDL 75 02/28/2023   LDLCALC 110 (H) 02/28/2023   TRIG 98 02/28/2023   CHOLHDL 2.7 12/31/2022   CHOLHDL 2.6 06/20/2022   CHOLHDL 2.9 06/04/2022   Lab Results  Component Value Date   ALT 17 02/28/2023   AST 21 02/28/2023   ALKPHOS 56 02/28/2023   BILITOT 0.3 02/28/2023   The ASCVD Risk score (Arnett DK, et al., 2019) failed to calculate for the following reasons:   The 2019 ASCVD risk score is only valid for ages 41 to 48  Plan: Will continue to monitor lipids every 4-6 months and consider statin therapy if LDL remains elevated.  Continue to work on nutrition plan -decreasing simple carbohydrates, increasing lean proteins, decreasing saturated fats and cholesterol , avoiding trans fats and exercise as able to promote weight loss, improve lipids and decrease  cardiovascular risks.  Prediabetes Labs were reviewed today and discussed with the patient.  Last A1c was 6.1- increasing, not at goal and insulin 15.9- decreasing but not at goal.   Medication(s): None Polyphagia:No She is working on nutrition plan to decrease simple carbohydrates, increase lean proteins and exercise to promote weight loss, improve glycemic control and prevent progression to Type 2 diabetes.   Lab Results  Component Value Date   HGBA1C 6.1 (H) 02/28/2023   HGBA1C 5.8 (H) 10/17/2022   HGBA1C 6.1 (H) 06/04/2022   HGBA1C 6.1 (H) 01/01/2022   Lab Results  Component Value Date   INSULIN 15.9 02/28/2023   INSULIN 17.5 06/04/2022    Plan:  Continue working on nutrition plan to decrease simple carbohydrates, increase lean proteins and exercise to promote weight loss, improve glycemic control and prevent progression to Type 2 diabetes.  Holding off on metformin for now, but will discuss again next visit with increased A1c.  She has been wanting to get back on track with eating plan first and see how she does with this . Very limited progress with weight loss overall.   Vitamin D Deficiency Labs were reviewed today and discussed with the patient.  Vitamin D is at goal of 50.  Most recent vitamin D level was 52.5. She is on  prescription ergocalciferol 50,000 IU weekly. No N/V or muscle weakness or other side effects with Ergocalciferol.  Lab Results  Component Value Date   VD25OH 52.5 02/28/2023   VD25OH 38.3 10/17/2022   VD25OH 27.7 (L) 06/04/2022    Plan: Continue and decrease dose  prescription ergocalciferol 50,000 IU every 14 days Low vitamin D levels can be associated with adiposity and may result in leptin resistance and weight gain. Also associated with fatigue. Currently on vitamin D supplementation without any adverse effects.    Other depression/emotional eating Lajoya has had issues with stress/emotional eating. Currently this is moderately  controlled. Overall mood is stable. Medication(s): Bupropion XL 300 mg daily and Topiramate 50 mg at bedtime.  Plan: Continue and refill Bupropion XL 300 mg daily and Topiramate 50 mg at bedtime.  Patient at max dose of bupropion and had fatigue with topamax initially which may limit increased dosing. Monitor closely. Continue to work on emotional eating strategies.       ATTESTASTION STATEMENTS:  Reviewed by clinician on day of visit: allergies, medications, problem list, medical history, surgical history, family history, social history, and previous encounter notes.   I have personally spent 30 minutes total time today in preparation, patient care, nutritional counseling  and documentation for this visit, including the following: review of clinical lab tests; review of medical tests/procedures/services.      Malika Demario, PA-C

## 2023-04-30 ENCOUNTER — Ambulatory Visit (INDEPENDENT_AMBULATORY_CARE_PROVIDER_SITE_OTHER): Payer: 59 | Admitting: Physician Assistant

## 2023-05-01 ENCOUNTER — Other Ambulatory Visit: Payer: Self-pay

## 2023-05-01 ENCOUNTER — Telehealth: Payer: Self-pay | Admitting: Internal Medicine

## 2023-05-01 DIAGNOSIS — E1140 Type 2 diabetes mellitus with diabetic neuropathy, unspecified: Secondary | ICD-10-CM

## 2023-05-01 MED ORDER — ESTRADIOL 2 MG PO TABS
2.0000 mg | ORAL_TABLET | Freq: Every day | ORAL | 1 refills | Status: DC
Start: 2023-05-01 — End: 2023-05-06

## 2023-05-01 NOTE — Telephone Encounter (Signed)
Patient is advising that she is out of her Estradiol   x1 month and need a new refill called into [facility] on [address]

## 2023-05-01 NOTE — Telephone Encounter (Signed)
Medication has been sent to [facility] for approval

## 2023-05-03 ENCOUNTER — Ambulatory Visit: Payer: 59 | Admitting: Internal Medicine

## 2023-05-03 ENCOUNTER — Encounter: Payer: Self-pay | Admitting: Internal Medicine

## 2023-05-03 VITALS — BP 126/74 | HR 71 | Ht 75.0 in | Wt 259.0 lb

## 2023-05-03 DIAGNOSIS — R7303 Prediabetes: Secondary | ICD-10-CM

## 2023-05-03 DIAGNOSIS — Z789 Other specified health status: Secondary | ICD-10-CM

## 2023-05-03 LAB — LUTEINIZING HORMONE: LH: 28.14 m[IU]/mL

## 2023-05-03 NOTE — Progress Notes (Unsigned)
Name: Renee Malone  MRN/ DOB: [id number], Nov 16, 1985    Age/ Sex: 37 y.o., adult     PCP: Elie Confer, NP   Reason for Endocrinology Evaluation: Gender dysphoria     Initial Endocrinology Clinic Visit: 10/27/2020    PATIENT IDENTIFIER: [name] is a 37 y.o., adult with a past medical history of HIV, gender dysphoria. She has followed with [facility] since 10/27/2020 for consultative assistance with management of her gender dysphoria.   HISTORICAL SUMMARY: The patient has been on gender affirming therapy since 2010.  She is status post breast reconstruction surgery in 2019.   She is status post vaginoplasty 07/2020 with revision of vaginal introitus and labia 05/2021   Switched Climara to oral estradiol due to rash 01/2022   Great grandmother and great aunt with breast Ca   SUBJECTIVE:    Today (05/03/2023):  Renee Malone is here for for follow-up on gender dysphoria.  Patient is a transgender female.    Patient has not been seen at our clinic in 15 months.  Patient was supposed to return for lab checkup 2 months after the visit but did not show up   Patient follows with [facility] Patient follows with infectious disease for continued HIV management   Restarted on Estrogen 2 days ago , was without it for a  month  Had hot flashes off estrogen with fatigue, and low energy  Bowel movements stable  Denies skin rash  Denies palpitations but has occasional chest tightness  NO facial hair after laser treatment    HISTORY:  Past Medical History:  Past Medical History:  Diagnosis Date   Anxiety    panic attacks   Elevated serum creatinine 06/10/2019   Heart murmur    HIV infection (HCC)    Lactose intolerance    Low hemoglobin 05/25/2020   Multiple food allergies    Prediabetes 05/25/2020   SOBOE (shortness of breath on exertion)    Weight gain 02/12/2022   Past Surgical History:  Past  Surgical History:  Procedure Laterality Date   BREAST ENHANCEMENT SURGERY Bilateral 02/25/2018   Procedure: MAMMOPLASTY AUGMENTATION WITH SILICONE IMPLANTS;  Surgeon: Glenna Fellows, MD;  Location: [facility];  Service: Plastics;  Laterality: Bilateral;   BREAST LUMPECTOMY Right 02/25/2018   Procedure: EXCISION RIGHT BREAST MASS;  Surgeon: Glenna Fellows, MD;  Location: [facility];  Service: Plastics;  Laterality: Right;   SKIN GRAFT     Social History:  reports that she quit smoking about 13 years ago. Her smoking use included cigarettes. She has never used smokeless tobacco. She reports current alcohol use of about 1.0 standard drink of alcohol per week. She reports that she does not use drugs. Family History:  Family History  Problem Relation Age of Onset   Hypertension Mother    Depression Mother    Anxiety disorder Mother    Bipolar disorder Mother    Schizophrenia Mother    Sleep apnea Mother    Drug abuse Mother    Obesity Mother    Hypertension Maternal Grandmother      HOME MEDICATIONS: Allergies as of 05/03/2023       Reactions   Broccoli [brassica Oleracea] Anaphylaxis   Other Anaphylaxis   Cauliflower        Medication List        Accurate as of May 03, 2023  7:08 AM. If you have any questions, ask your nurse  or doctor.          ALPRAZolam 1 MG tablet Commonly known as: XANAX TAKE ONE TABLET BY MOUTH EVERY 8 HOURS AS NEEDED   Biktarvy 50-200-25 MG Tabs tablet Generic drug: bictegravir-emtricitabine-tenofovir AF Take 1 tablet by mouth daily.   buPROPion 300 MG 24 hr tablet Commonly known as: Wellbutrin XL Take 1 tablet (300 mg total) by mouth daily.   estradiol 2 MG tablet Commonly known as: ESTRACE Take 1 tablet (2 mg total) by mouth daily.   hydrocortisone 1 % ointment Apply 1 application topically 2 (two) times daily.   topiramate 50 MG tablet Commonly known as: Topamax Take 1 tablet (50 mg total) by  mouth daily. Take 1/2 tab at bedtime for the first 2 weeks and if tolerated, increase to 50 mg at bedtime   Vitamin D (Ergocalciferol) 1.25 MG (50000 UNIT) Caps capsule Commonly known as: DRISDOL Take 1 capsule (50,000 Units total) by mouth every 14 (fourteen) days.          OBJECTIVE:   PHYSICAL EXAM: VS: There were no vitals taken for this visit.  EXAM: General: Pt appears well and is in NAD  Neck: General: Supple without adenopathy. Thyroid: Thyroid size normal.  No goiter or nodules appreciated.  Lungs: Clear with good BS bilat   Heart: Auscultation: RRR.  Abdomen: Normoactive bowel sounds, soft, nontender  Extremities:  BL LE: No pretibial edema  Mental Status: Judgment, insight: Intact Orientation: Oriented to time, place, and person Mood and affect: No depression, anxiety, or agitation     DATA REVIEWED:   Latest Reference Range & Units 05/03/23 10:42  LH mIU/mL 28.14  Estradiol pg/mL 24    Latest Reference Range & Units 02/28/23 09:20  Sodium 134 - 144 mmol/L 141  Potassium 3.5 - 5.2 mmol/L 4.8  Chloride 96 - 106 mmol/L 102  CO2 20 - 29 mmol/L 23  Glucose 70 - 99 mg/dL 91  BUN 6 - 20 mg/dL 14  Creatinine 1.02 - 7.25 mg/dL 3.66 (H)  Calcium 8.7 - 10.2 mg/dL 44.0  BUN/Creatinine Ratio 9 - 23  12  eGFR >59 mL/min/1.73 63  Alkaline Phosphatase 44 - 121 IU/L 56  Albumin 3.9 - 4.9 g/dL 4.6  Albumin/Globulin Ratio 1.2 - 2.2  1.8  AST 0 - 40 IU/L 21  ALT 0 - 32 IU/L 17  Total Protein 6.0 - 8.5 g/dL 7.2  Total Bilirubin 0.0 - 1.2 mg/dL 0.3  Cholesterol, Total 100 - 199 mg/dL 347 (H)  HDL Cholesterol >39 mg/dL 75  LDL/HDL Ratio 0.0 - 3.2 ratio 1.5  Triglycerides 0 - 149 mg/dL 98  VLDL Cholesterol Cal 5 - 40 mg/dL 17  LDL Chol Calc (NIH) 0 - 99 mg/dL 425 (H)  (H): Data is abnormally high   Latest Reference Range & Units Most Recent  Estradiol < OR = 29 pg/mL 16 05/02/21 15:56  Estradiol, Free pg/mL 0.37 05/02/21 15:56  Estrogen 60 - 190 pg/mL  117.9 08/03/20 14:24  Progesterone <1.4 ng/mL <0.5 08/03/20 14:24  Testosterone 264 - 916 ng/dL <3 (L) 9/56/38 75:64  Testosterone Free 8.7 - 25.1 pg/mL 0.2 (L) 05/02/21 15:56  (L): Data is abnormally low   Latest Reference Range & Units 01/01/22 02:56  Hemoglobin A1C <5.7 % of total Hgb 6.1 (H)      ASSESSMENT / PLAN / RECOMMENDATIONS:   Female to Female Transgender    -Estrogen level is the low end of normal, this is due to interruption in estradiol intake,  will increase the dose as below and recheck on the next visit -Patient allergic to estrogen patches    Medications   Increase estradiol 2 mg, 2 tabs  daily  2. Prediabetes :  -A1c 6.1% -Will defer management to [facility]  Follow-up in 6 months      Signed electronically by: Lyndle Herrlich, MD  [facility]  [facility] [address] [place] [place], [place] [address] Phone: [phone] FAX: [phone]      CC: Elie Confer, NP [address] [place] [address] Phone: [phone]  Fax: [phone]   Return to Endocrinology clinic as below: Future Appointments  Date Time Provider Department Center  05/03/2023 10:30 AM Arianis Bowditch, Konrad Dolores, MD [facility] None  05/06/2023 10:00 AM [place], Jinny Blossom, NP [facility] None

## 2023-05-06 ENCOUNTER — Ambulatory Visit (INDEPENDENT_AMBULATORY_CARE_PROVIDER_SITE_OTHER): Payer: 59 | Admitting: Adult Health

## 2023-05-06 MED ORDER — ESTRADIOL 2 MG PO TABS
4.0000 mg | ORAL_TABLET | Freq: Every day | ORAL | 2 refills | Status: DC
Start: 2023-05-06 — End: 2023-11-11

## 2023-05-27 NOTE — Progress Notes (Unsigned)
.smr  Office: [phone]  /  Fax: [phone]  WEIGHT SUMMARY AND BIOMETRICS  Vitals Temp: 98.8 F (37.1 C) BP: 128/86 Pulse Rate: 79 SpO2: 99 %   Anthropometric Measurements Height: 6\' 3"  (1.905 m) Weight: 257 lb (116.6 kg) BMI (Calculated): 32.12 Weight at Last Visit: 253lb Weight Lost Since Last Visit: 0lb Weight Gained Since Last Visit: 4lb Starting Weight: 249lb Total Weight Loss (lbs): 0 lb (0 kg)   Body Composition  Body Fat %: 41.4 % Fat Mass (lbs): 106.4 lbs Muscle Mass (lbs): 143 lbs Total Body Water (lbs): 94.6 lbs Visceral Fat Rating : 9   Other Clinical Data Fasting: no Labs: no Today's Visit #: 14 Starting Date: 06/04/22     HPI  Chief Complaint: OBESITY  Renee Malone is here to discuss her progress with her obesity treatment plan. She is on the keeping a food journal and adhering to recommended goals of 1500-1600 calories and 90 protein and states she is following her eating plan approximately 20 % of the time. She states she is exercising walking 30  minutes 5 times per week.   Interval History:  Since last office visit she up 4 lbs.  Has gotten off track with plan until the past several weeks.  Returned to journaling more consistently and trying to eat very clean.  Focusing on getting 90 grams of protein daily  Hunger/appetite-Fair to moderate control when eating back on plan Cravings- denies excessive cravings except when using THC Stress- Disappointed in not getting promotion, but working on other projects as well and has new goals to work towards  [facility] regularly Hydration-Adequate.    Pharmacotherapy: Wants to resume metformin. No GI or other side effects.     Stopped topamax- Did not feel was helpful for cravings.   TREATMENT PLAN FOR OBESITY:  Recommended Dietary Goals  Renee Malone is currently in the action stage of change. As such, her goal is to continue weight management plan. She has agreed to keeping a food  journal and adhering to recommended goals of 1500-1600 calories and 90 grams of  protein.  Behavioral Intervention  We discussed the following Behavioral Modification Strategies today: increasing lean protein intake, decreasing simple carbohydrates , increasing vegetables, increasing lower glycemic fruits, avoiding skipping meals, increasing water intake, work on meal planning and preparation, work on tracking and journaling calories using tracking application, decreasing eating out or consumption of processed foods, and making healthy choices when eating convenient foods, emotional eating strategies and understanding the difference between hunger signals and cravings, continue to practice mindfulness when eating, and planning for success.  Additional resources provided today: NA  Recommended Physical Activity Goals  Renee Malone has been advised to work up to 150 minutes of moderate intensity aerobic activity a week and strengthening exercises 2-3 times per week for cardiovascular health, weight loss maintenance and preservation of muscle mass.   She has agreed to Continue current level of physical activity  and Increase physical activity in their day and reduce sedentary time (increase NEAT).   Pharmacotherapy We discussed various medication options to help Renee Malone with her weight loss efforts and we both agreed to resume metformin for prediabetes.    Return in about 4 weeks (around 06/25/2023) for Fasting Lab.Marland Kitchen She was informed of the importance of frequent follow up visits to maximize her success with intensive lifestyle modifications for her multiple health conditions.  PHYSICAL EXAM:  Blood pressure 128/86, pulse 79, temperature 98.8 F (37.1 C), height 6\' 3"  (1.905 m), weight 257 lb (116.6  kg), SpO2 99%. Body mass index is 32.12 kg/m.  General: She is overweight, cooperative, alert, well developed, and in no acute distress. PSYCH: Has normal mood, affect and thought process.    Cardiovascular: HR 70's , BP 128/86 Lungs: Normal breathing effort, no conversational dyspnea. Neuro: no focal deficits  DIAGNOSTIC DATA REVIEWED:  BMET    Component Value Date/Time   NA 141 02/28/2023 0920   K 4.8 02/28/2023 0920   CL 102 02/28/2023 0920   CO2 23 02/28/2023 0920   GLUCOSE 91 02/28/2023 0920   GLUCOSE 95 12/31/2022 1201   BUN 14 02/28/2023 0920   CREATININE 1.14 (H) 02/28/2023 0920   CREATININE 1.27 (H) 12/31/2022 1201   CALCIUM 10.0 02/28/2023 0920   GFRNONAA >60 07/22/2022 1655   GFRNONAA 63 01/30/2021 0000   GFRAA 73 01/30/2021 0000   Lab Results  Component Value Date   HGBA1C 6.1 (H) 02/28/2023   HGBA1C 6.1 (H) 01/01/2022   Lab Results  Component Value Date   INSULIN 15.9 02/28/2023   INSULIN 17.5 06/04/2022   Lab Results  Component Value Date   TSH 1.560 02/28/2023   CBC    Component Value Date/Time   WBC 5.0 02/28/2023 0920   WBC 6.7 12/31/2022 1201   RBC 4.23 02/28/2023 0920   RBC 4.41 12/31/2022 1201   HGB 13.1 02/28/2023 0920   HCT 39.1 02/28/2023 0920   PLT 220 02/28/2023 0920   MCV 92 02/28/2023 0920   MCH 31.0 02/28/2023 0920   MCH 30.8 12/31/2022 1201   MCHC 33.5 02/28/2023 0920   MCHC 34.0 12/31/2022 1201   RDW 12.8 02/28/2023 0920   Iron Studies No results found for: "IRON", "TIBC", "FERRITIN", "IRONPCTSAT" Lipid Panel     Component Value Date/Time   CHOL 202 (H) 02/28/2023 0920   TRIG 98 02/28/2023 0920   HDL 75 02/28/2023 0920   CHOLHDL 2.7 12/31/2022 1201   VLDL 31 (H) 11/14/2016 1613   LDLCALC 110 (H) 02/28/2023 0920   LDLCALC 104 (H) 12/31/2022 1201   Hepatic Function Panel     Component Value Date/Time   PROT 7.2 02/28/2023 0920   ALBUMIN 4.6 02/28/2023 0920   AST 21 02/28/2023 0920   ALT 17 02/28/2023 0920   ALKPHOS 56 02/28/2023 0920   BILITOT 0.3 02/28/2023 0920      Component Value Date/Time   TSH 1.560 02/28/2023 0920   Nutritional Lab Results  Component Value Date   VD25OH 52.5 02/28/2023    VD25OH 38.3 10/17/2022   VD25OH 27.7 (L) 06/04/2022    ASSOCIATED CONDITIONS ADDRESSED TODAY  ASSESSMENT AND PLAN  Problem List Items Addressed This Visit     Depression   Relevant Medications   buPROPion (WELLBUTRIN XL) 300 MG 24 hr tablet   Prediabetes - Primary   Relevant Medications   metFORMIN (GLUCOPHAGE) 500 MG tablet   Vitamin D deficiency   Relevant Medications   Vitamin D, Ergocalciferol, (DRISDOL) 1.25 MG (50000 UNIT) CAPS capsule   Obesity-- Start BMI 31.12   Relevant Medications   metFORMIN (GLUCOPHAGE) 500 MG tablet   Other hyperlipidemia  Prediabetes Last A1c was 6.1/ Insulin 15.9- Not at goals and A1c worsening.   Medication(s): None Polyphagia:Yes at times.  She is working on Engineer, technical sales to decrease simple carbohydrates, increase lean proteins and exercise to promote weight loss, improve glycemic control and prevent progression to Type 2 diabetes.  She is very concerned about preventing the development of Type 2 diabetes and wants to resume metformin  Lab Results  Component Value Date   HGBA1C 6.1 (H) 02/28/2023   HGBA1C 5.8 (H) 10/17/2022   HGBA1C 6.1 (H) 06/04/2022   HGBA1C 6.1 (H) 01/01/2022   Lab Results  Component Value Date   INSULIN 15.9 02/28/2023   INSULIN 17.5 06/04/2022    Plan: Re-Start Metformin 500 mg once daily breakfast- Can start with 1/2 tablet daily and if tolerates well can increase to full tablet daily after 1 week.  Continue working on nutrition plan to decrease simple carbohydrates, increase lean proteins and exercise to promote weight loss, improve glycemic control and prevent progression to Type 2 diabetes.  Recheck labs next visit. Monitor for electrolyte abnormalities   Hyperlipidemia LDL is not at goal. Medication(s): None Cardiovascular risk factors: dyslipidemia, obesity (BMI >= 30 kg/m2), and smoking/ tobacco exposure  Lab Results  Component Value Date   CHOL 202 (H) 02/28/2023   HDL 75 02/28/2023   LDLCALC  110 (H) 02/28/2023   TRIG 98 02/28/2023   CHOLHDL 2.7 12/31/2022   CHOLHDL 2.6 06/20/2022   CHOLHDL 2.9 06/04/2022   Lab Results  Component Value Date   ALT 17 02/28/2023   AST 21 02/28/2023   ALKPHOS 56 02/28/2023   BILITOT 0.3 02/28/2023   The ASCVD Risk score (Arnett DK, et al., 2019) failed to calculate for the following reasons:   The 2019 ASCVD risk score is only valid for ages 25 to 28  Plan: Continue to work on Engineer, technical sales -decreasing simple carbohydrates, increasing lean proteins, decreasing saturated fats and cholesterol , avoiding trans fats and exercise as able to promote weight loss, improve lipids and decrease cardiovascular risks. May be candidate for statin therapy if does not show improvement with nutrition plan and exercise,    Vitamin D Deficiency Vitamin D is at goal of 50.  Most recent vitamin D level was 52.5. She is on  prescription ergocalciferol 50,000 IU every 14 days. Lab Results  Component Value Date   VD25OH 52.5 02/28/2023   VD25OH 38.3 10/17/2022   VD25OH 27.7 (L) 06/04/2022    Plan: Continue and refill  prescription ergocalciferol 50,000 IU every 14 days Low vitamin D levels can be associated with adiposity and may result in leptin resistance and weight gain. Also associated with fatigue. Currently on vitamin D supplementation without any adverse effects.  Recheck vitamin D level next visit.   Other depression/emotional eating Cire has had issues with stress/emotional eating. Currently this is moderately controlled. Overall mood is stable. Medication(s): Bupropion XL 300 mg daily reports no side effects with bupropion.   Plan: Continue and refill Bupropion XL 300 mg daily Continue to work on emotional eating behaviors Consider referral to Dr. Dewaine Conger next visit.   ATTESTASTION STATEMENTS:  Reviewed by clinician on day of visit: allergies, medications, problem list, medical history, surgical history, family history, social history,  and previous encounter notes.   I have personally spent 38 minutes total time today in preparation, patient care, nutritional counseling and documentation for this visit, including the following: review of clinical lab tests; review of medical tests/procedures/services.      Rexford Prevo, PA-C

## 2023-05-28 ENCOUNTER — Encounter (INDEPENDENT_AMBULATORY_CARE_PROVIDER_SITE_OTHER): Payer: Self-pay | Admitting: Physician Assistant

## 2023-05-28 ENCOUNTER — Other Ambulatory Visit (INDEPENDENT_AMBULATORY_CARE_PROVIDER_SITE_OTHER): Payer: Self-pay | Admitting: Physician Assistant

## 2023-05-28 ENCOUNTER — Ambulatory Visit (INDEPENDENT_AMBULATORY_CARE_PROVIDER_SITE_OTHER): Payer: 59 | Admitting: Physician Assistant

## 2023-05-28 VITALS — BP 128/86 | HR 79 | Temp 98.8°F | Ht 75.0 in | Wt 257.0 lb

## 2023-05-28 DIAGNOSIS — F3289 Other specified depressive episodes: Secondary | ICD-10-CM

## 2023-05-28 DIAGNOSIS — E559 Vitamin D deficiency, unspecified: Secondary | ICD-10-CM

## 2023-05-28 DIAGNOSIS — E785 Hyperlipidemia, unspecified: Secondary | ICD-10-CM

## 2023-05-28 DIAGNOSIS — R7303 Prediabetes: Secondary | ICD-10-CM

## 2023-05-28 DIAGNOSIS — E669 Obesity, unspecified: Secondary | ICD-10-CM

## 2023-05-28 DIAGNOSIS — E7849 Other hyperlipidemia: Secondary | ICD-10-CM

## 2023-05-28 DIAGNOSIS — Z6832 Body mass index (BMI) 32.0-32.9, adult: Secondary | ICD-10-CM

## 2023-05-28 MED ORDER — VITAMIN D (ERGOCALCIFEROL) 1.25 MG (50000 UNIT) PO CAPS
50000.0000 [IU] | ORAL_CAPSULE | ORAL | 0 refills | Status: DC
Start: 2023-05-28 — End: 2023-07-01

## 2023-05-28 MED ORDER — METFORMIN HCL 500 MG PO TABS
500.0000 mg | ORAL_TABLET | Freq: Every day | ORAL | 0 refills | Status: DC
Start: 2023-05-28 — End: 2023-07-01

## 2023-05-28 MED ORDER — BUPROPION HCL ER (XL) 300 MG PO TB24: 300.0000 mg | ORAL_TABLET | Freq: Every day | ORAL | 0 refills | Status: DC

## 2023-07-01 ENCOUNTER — Encounter (INDEPENDENT_AMBULATORY_CARE_PROVIDER_SITE_OTHER): Payer: Self-pay | Admitting: Physician Assistant

## 2023-07-01 ENCOUNTER — Ambulatory Visit (INDEPENDENT_AMBULATORY_CARE_PROVIDER_SITE_OTHER): Payer: 59 | Admitting: Physician Assistant

## 2023-07-01 ENCOUNTER — Other Ambulatory Visit (INDEPENDENT_AMBULATORY_CARE_PROVIDER_SITE_OTHER): Payer: Self-pay | Admitting: Physician Assistant

## 2023-07-01 VITALS — BP 108/74 | HR 58 | Temp 98.1°F | Ht 75.0 in | Wt 254.0 lb

## 2023-07-01 DIAGNOSIS — E559 Vitamin D deficiency, unspecified: Secondary | ICD-10-CM

## 2023-07-01 DIAGNOSIS — R7303 Prediabetes: Secondary | ICD-10-CM | POA: Diagnosis not present

## 2023-07-01 DIAGNOSIS — F3289 Other specified depressive episodes: Secondary | ICD-10-CM

## 2023-07-01 DIAGNOSIS — R5383 Other fatigue: Secondary | ICD-10-CM | POA: Diagnosis not present

## 2023-07-01 DIAGNOSIS — E669 Obesity, unspecified: Secondary | ICD-10-CM

## 2023-07-01 DIAGNOSIS — F32A Depression, unspecified: Secondary | ICD-10-CM

## 2023-07-01 DIAGNOSIS — Z6831 Body mass index (BMI) 31.0-31.9, adult: Secondary | ICD-10-CM

## 2023-07-01 MED ORDER — TOPIRAMATE 50 MG PO TABS
50.0000 mg | ORAL_TABLET | Freq: Every day | ORAL | 0 refills | Status: DC
Start: 2023-07-01 — End: 2023-07-01

## 2023-07-01 MED ORDER — METFORMIN HCL 500 MG PO TABS
500.0000 mg | ORAL_TABLET | Freq: Every day | ORAL | 0 refills | Status: DC
Start: 2023-07-01 — End: 2023-07-29

## 2023-07-01 MED ORDER — VITAMIN D (ERGOCALCIFEROL) 1.25 MG (50000 UNIT) PO CAPS: 50000.0000 [IU] | ORAL_CAPSULE | ORAL | 0 refills | Status: DC

## 2023-07-01 MED ORDER — BUPROPION HCL ER (XL) 300 MG PO TB24
300.0000 mg | ORAL_TABLET | Freq: Every day | ORAL | 0 refills | Status: DC
Start: 2023-07-01 — End: 2023-07-29

## 2023-07-01 NOTE — Progress Notes (Signed)
.smr  Office: [phone]  /  Fax: [phone]  WEIGHT SUMMARY AND BIOMETRICS  Vitals Temp: 98.1 F (36.7 C) BP: 108/74 Pulse Rate: (!) 58 SpO2: 98 %   Anthropometric Measurements Height: 6\' 3"  (1.905 m) Weight: 254 lb (115.2 kg) BMI (Calculated): 31.75 Weight at Last Visit: 257 lb Weight Lost Since Last Visit: 3 lb Weight Gained Since Last Visit: 0 Starting Weight: 249 lb Total Weight Loss (lbs): 0 lb (0 kg) Peak Weight: 249 lb   Body Composition  Body Fat %: 41.8 % Fat Mass (lbs): 106.4 lbs Muscle Mass (lbs): 140.8 lbs Total Body Water (lbs): 98.6 lbs Visceral Fat Rating : 9   Other Clinical Data Fasting: yes Labs: yes Today's Visit #: 15 Starting Date: 06/04/22     HPI  Chief Complaint: OBESITY  Renee Malone is here to discuss her progress with her obesity treatment plan. She is on the keeping a food journal and adhering to recommended goals of 1500-1600 calories and 90 grams of protein and states she is following her eating plan approximately 70 % of the time. She states she is exercising 0 minutes 0 times per week.  Discussed the use of AI scribe software for clinical note transcription with the patient, who gave verbal consent to proceed.  History of Present Illness  /     Interval History:  Since last office visit she is down 3 lbs.   Renee Malone, a patient with a history of obesity, prediabetes, vitamin D deficiency, and emotional eating, presents for a follow-up visit. Recently, the patient has been dealing with significant stress due to family issues, including managing her grandmother's medications and her husband's recent manic episode. This stress has led to some emotional eating, although the patient reports that she has been trying to get back on tract with nutrition plan.   The patient has been taking metformin for her prediabetes, but at a half dose due to gastrointestinal side effects. She reports that she has been adjusting to the  medication and plans to continue taking the half dose for another week before considering an increase. She has stopped taking Topamax. The patient is also on Wellbutrin, which she finds helpful for managing her stress.  The patient has been trying to manage her stress and is considering incorporating exercise and yoga into her routine. She reports that she had been walking regularly until recent stressors disrupted her routine. She has also tried an aerobics workout and we discussed trying a bedtime yoga routine. The patient reports that she has been sleeping well and feels rested upon waking.  Pharmacotherapy: metformin for prediabetes. Some GI upset and has is taking 1/2 dose ( 250 mg ) without GI side effects currently.  Stopped topamax due to side effects- fatigue/tiredness Wellbutrin for emotional eating behaviors/cravings. No Side effects.   Fasting labs obtained today.  She was informed we would discuss her lab results at her next visit unless there is a critical issue that needs to be addressed sooner. She agreed to keep her next visit at the agreed upon time to discuss these results.    TREATMENT PLAN FOR OBESITY:  Recommended Dietary Goals  [name] is currently in the action stage of change. As such, her goal is to continue weight management plan. She has agreed to keeping a food journal and adhering to recommended goals of 1500-1600 calories and 90+ grams of protein.  Behavioral Intervention  We discussed the following Behavioral Modification Strategies today: increasing lean protein intake, decreasing simple carbohydrates ,  increasing vegetables, increasing lower glycemic fruits, increasing fiber rich foods, avoiding skipping meals, increasing water intake, work on tracking and journaling calories using tracking application, emotional eating strategies and understanding the difference between hunger signals and cravings, work on managing stress, creating time for self-care and  relaxation measures, continue to practice mindfulness when eating, and planning for success.  Additional resources provided today: NA  Recommended Physical Activity Goals  Lloyd has been advised to work up to 150 minutes of moderate intensity aerobic activity a week and strengthening exercises 2-3 times per week for cardiovascular health, weight loss maintenance and preservation of muscle mass.   She has agreed to Think about ways to increase daily physical activity and overcoming barriers to exercise and think about yoga for stress reduction as well.    Pharmacotherapy We discussed various medication options to help Avannah with her weight loss efforts and we both agreed to continue metformin for primary indication of prediabetes and Wellbutrin for emotional eating behaviors.    Return in about 4 weeks (around 07/29/2023).Marland Kitchen She was informed of the importance of frequent follow up visits to maximize her success with intensive lifestyle modifications for her multiple health conditions.  PHYSICAL EXAM:  Blood pressure 108/74, pulse (!) 58, temperature 98.1 F (36.7 C), height 6\' 3"  (1.905 m), weight 254 lb (115.2 kg), SpO2 98%. Body mass index is 31.75 kg/m.  General: She is overweight, cooperative, alert, well developed, and in no acute distress. PSYCH: Has normal mood, affect and thought process.   Cardiovascular: HR 50's BP 108/74 Lungs: Normal breathing effort, no conversational dyspnea. Neuro: no focal deficits  DIAGNOSTIC DATA REVIEWED:  BMET    Component Value Date/Time   NA 141 02/28/2023 0920   K 4.8 02/28/2023 0920   CL 102 02/28/2023 0920   CO2 23 02/28/2023 0920   GLUCOSE 91 02/28/2023 0920   GLUCOSE 95 12/31/2022 1201   BUN 14 02/28/2023 0920   CREATININE 1.14 (H) 02/28/2023 0920   CREATININE 1.27 (H) 12/31/2022 1201   CALCIUM 10.0 02/28/2023 0920   GFRNONAA >60 07/22/2022 1655   GFRNONAA 63 01/30/2021 0000   GFRAA 73 01/30/2021 0000   Lab Results   Component Value Date   HGBA1C 6.1 (H) 02/28/2023   HGBA1C 6.1 (H) 01/01/2022   Lab Results  Component Value Date   INSULIN 15.9 02/28/2023   INSULIN 17.5 06/04/2022   Lab Results  Component Value Date   TSH 1.560 02/28/2023   CBC    Component Value Date/Time   WBC 5.0 02/28/2023 0920   WBC 6.7 12/31/2022 1201   RBC 4.23 02/28/2023 0920   RBC 4.41 12/31/2022 1201   HGB 13.1 02/28/2023 0920   HCT 39.1 02/28/2023 0920   PLT 220 02/28/2023 0920   MCV 92 02/28/2023 0920   MCH 31.0 02/28/2023 0920   MCH 30.8 12/31/2022 1201   MCHC 33.5 02/28/2023 0920   MCHC 34.0 12/31/2022 1201   RDW 12.8 02/28/2023 0920   Iron Studies No results found for: "IRON", "TIBC", "FERRITIN", "IRONPCTSAT" Lipid Panel     Component Value Date/Time   CHOL 202 (H) 02/28/2023 0920   TRIG 98 02/28/2023 0920   HDL 75 02/28/2023 0920   CHOLHDL 2.7 12/31/2022 1201   VLDL 31 (H) 11/14/2016 1613   LDLCALC 110 (H) 02/28/2023 0920   LDLCALC 104 (H) 12/31/2022 1201   Hepatic Function Panel     Component Value Date/Time   PROT 7.2 02/28/2023 0920   ALBUMIN 4.6 02/28/2023 0920  AST 21 02/28/2023 0920   ALT 17 02/28/2023 0920   ALKPHOS 56 02/28/2023 0920   BILITOT 0.3 02/28/2023 0920      Component Value Date/Time   TSH 1.560 02/28/2023 0920   Nutritional Lab Results  Component Value Date   VD25OH 52.5 02/28/2023   VD25OH 38.3 10/17/2022   VD25OH 27.7 (L) 06/04/2022    ASSOCIATED CONDITIONS ADDRESSED TODAY  ASSESSMENT AND PLAN  Problem List Items Addressed This Visit     Depression   Relevant Medications   buPROPion (WELLBUTRIN XL) 300 MG 24 hr tablet   topiramate (TOPAMAX) 50 MG tablet   Other fatigue   Relevant Orders   Vitamin B12   CBC with Differential/Platelet   TSH   Prediabetes - Primary   Relevant Medications   metFORMIN (GLUCOPHAGE) 500 MG tablet   Other Relevant Orders   CMP14+EGFR   Hemoglobin A1c   Insulin, random   Lipid Panel With LDL/HDL Ratio   Vitamin D  deficiency   Relevant Medications   Vitamin D, Ergocalciferol, (DRISDOL) 1.25 MG (50000 UNIT) CAPS capsule   Other Relevant Orders   VITAMIN D 25 Hydroxy (Vit-D Deficiency, Fractures)   Obesity-- Start BMI 31.12   Relevant Medications   metFORMIN (GLUCOPHAGE) 500 MG tablet   BMI 31.0-31.9,adult Current BMI 31.4  Prediabetes Last A1c was increased from previous and has been very mindful of avoiding sugar and sweets.  Patient reports stress eating due to personal issues but has lost weight. Currently taking half dose of Metformin due to gastrointestinal side effects. Not currently taking Topamax. -Continue/refill Metformin at tolerated dose 250 mg daily. -Discontinue Topamax. -Check fasting labs today including A1C. -Encourage consistent exercise such as walking or at-home workouts. -Continue/Refill Wellbutrin. -Follow-up appointment in one month on 07/29/2023 to review labs.  Emotional Eating Patient reports high stress levels due to personal issues, contributing to emotional eating. -Encourage stress management techniques such as yoga. -Continue/Refill Wellbutrin XL 300 mg daily. Discontinue topamax.   Vitamin D Deficiency Vitamin D is at goal of 50.  Most recent vitamin D level was 52.5. She is on  prescription ergocalciferol 50,000 IU every 14 days. Lab Results  Component Value Date   VD25OH 52.5 02/28/2023   VD25OH 38.3 10/17/2022   VD25OH 27.7 (L) 06/04/2022    Plan: Continue and refill  prescription ergocalciferol 50,000 IU every 14 days Recheck vitamin D level today.  Low vitamin D levels can be associated with adiposity and may result in leptin resistance and weight gain. Also associated with fatigue. Currently on vitamin D supplementation without any adverse effects.   Fatigue: Endorses some fatigue. Not working out as much lately with increased stress.  Will recheck labs today including CBC, B 12, vitamin D and TSH.   General Health Maintenance -Encourage  consistent exercise such as walking or at-home workouts. -Check fasting labs today. -Follow-up appointment in one month on 07/29/2023 to review labs.  ATTESTASTION STATEMENTS:  Reviewed by clinician on day of visit: allergies, medications, problem list, medical history, surgical history, family history, social history, and previous encounter notes.   I have personally spent 46 minutes total time today in preparation, patient care, nutritional counseling and documentation for this visit, including the following: review of clinical lab tests; review of medical tests/procedures/services.      Eann Cleland, PA-C

## 2023-07-02 LAB — LIPID PANEL WITH LDL/HDL RATIO
Cholesterol, Total: 177 mg/dL (ref 100–199)
HDL: 63 mg/dL (ref >39)
LDL Chol Calc (NIH): 100 mg/dL — ABNORMAL HIGH (ref 0–99)
LDL/HDL Ratio: 1.6 ratio (ref 0.0–3.2)
Triglycerides: 76 mg/dL (ref 0–149)
VLDL Cholesterol Cal: 14 mg/dL (ref 5–40)

## 2023-07-02 LAB — CMP14+EGFR
ALT: 20 IU/L (ref 0–32)
AST: 39 IU/L (ref 0–40)
Albumin: 4.4 g/dL (ref 3.9–4.9)
Alkaline Phosphatase: 48 IU/L (ref 44–121)
BUN/Creatinine Ratio: 10 (ref 9–23)
BUN: 11 mg/dL (ref 6–20)
Bilirubin Total: 0.2 mg/dL (ref 0.0–1.2)
CO2: 22 mmol/L (ref 20–29)
Calcium: 9.5 mg/dL (ref 8.7–10.2)
Chloride: 101 mmol/L (ref 96–106)
Creatinine, Ser: 1.11 mg/dL — ABNORMAL HIGH (ref 0.57–1.00)
Globulin, Total: 2.3 g/dL (ref 1.5–4.5)
Glucose: 93 mg/dL (ref 70–99)
Potassium: 4.2 mmol/L (ref 3.5–5.2)
Sodium: 140 mmol/L (ref 134–144)
Total Protein: 6.7 g/dL (ref 6.0–8.5)
eGFR: 65 mL/min/{1.73_m2} (ref >59)

## 2023-07-02 LAB — CBC WITH DIFFERENTIAL/PLATELET
Basophils Absolute: 0 10*3/uL (ref 0.0–0.2)
Basos: 1 %
EOS (ABSOLUTE): 0.1 10*3/uL (ref 0.0–0.4)
Eos: 2 %
Hematocrit: 37 % (ref 34.0–46.6)
Hemoglobin: 12.4 g/dL (ref 11.1–15.9)
Immature Grans (Abs): 0 10*3/uL (ref 0.0–0.1)
Immature Granulocytes: 0 %
Lymphocytes Absolute: 1.6 10*3/uL (ref 0.7–3.1)
Lymphs: 37 %
MCH: 31 pg (ref 26.6–33.0)
MCHC: 33.5 g/dL (ref 31.5–35.7)
MCV: 93 fL (ref 79–97)
Monocytes Absolute: 0.4 10*3/uL (ref 0.1–0.9)
Monocytes: 9 %
Neutrophils Absolute: 2.3 10*3/uL (ref 1.4–7.0)
Neutrophils: 51 %
Platelets: 265 10*3/uL (ref 150–450)
RBC: 4 x10E6/uL (ref 3.77–5.28)
RDW: 12.9 % (ref 11.7–15.4)
WBC: 4.4 10*3/uL (ref 3.4–10.8)

## 2023-07-02 LAB — HEMOGLOBIN A1C
Est. average glucose Bld gHb Est-mCnc: 123 mg/dL
Hgb A1c MFr Bld: 5.9 % — ABNORMAL HIGH (ref 4.8–5.6)

## 2023-07-02 LAB — INSULIN, RANDOM: INSULIN: 10.9 u[IU]/mL (ref 2.6–24.9)

## 2023-07-02 LAB — VITAMIN B12: Vitamin B-12: 604 pg/mL (ref 232–1245)

## 2023-07-02 LAB — VITAMIN D 25 HYDROXY (VIT D DEFICIENCY, FRACTURES): Vit D, 25-Hydroxy: 55.4 ng/mL (ref 30.0–100.0)

## 2023-07-02 LAB — TSH: TSH: 1.78 u[IU]/mL (ref 0.450–4.500)

## 2023-07-29 ENCOUNTER — Ambulatory Visit (INDEPENDENT_AMBULATORY_CARE_PROVIDER_SITE_OTHER): Payer: 59 | Admitting: Physician Assistant

## 2023-07-29 ENCOUNTER — Encounter (INDEPENDENT_AMBULATORY_CARE_PROVIDER_SITE_OTHER): Payer: Self-pay | Admitting: Physician Assistant

## 2023-07-29 VITALS — BP 100/66 | HR 60 | Temp 98.3°F | Ht 75.0 in | Wt 249.0 lb

## 2023-07-29 DIAGNOSIS — E669 Obesity, unspecified: Secondary | ICD-10-CM

## 2023-07-29 DIAGNOSIS — Z6831 Body mass index (BMI) 31.0-31.9, adult: Secondary | ICD-10-CM

## 2023-07-29 DIAGNOSIS — E559 Vitamin D deficiency, unspecified: Secondary | ICD-10-CM | POA: Diagnosis not present

## 2023-07-29 DIAGNOSIS — F32A Depression, unspecified: Secondary | ICD-10-CM | POA: Diagnosis not present

## 2023-07-29 DIAGNOSIS — F3289 Other specified depressive episodes: Secondary | ICD-10-CM

## 2023-07-29 DIAGNOSIS — R7303 Prediabetes: Secondary | ICD-10-CM | POA: Diagnosis not present

## 2023-07-29 MED ORDER — VITAMIN D (ERGOCALCIFEROL) 1.25 MG (50000 UNIT) PO CAPS: 50000.0000 [IU] | ORAL_CAPSULE | ORAL | 0 refills | Status: DC

## 2023-07-29 MED ORDER — BUPROPION HCL ER (XL) 300 MG PO TB24
300.0000 mg | ORAL_TABLET | Freq: Every day | ORAL | 0 refills | Status: DC
Start: 2023-07-29 — End: 2023-08-26

## 2023-07-29 NOTE — Progress Notes (Signed)
.[facility]  Office: [phone]  /  Fax: [phone]  WEIGHT SUMMARY AND BIOMETRICS  Vitals Temp: 98.3 F (36.8 C) BP: 100/66 Pulse Rate: 60 SpO2: 97 %   Anthropometric Measurements Height: 6\' 3"  (1.905 m) Weight: 249 lb (112.9 kg) BMI (Calculated): 31.12 Weight at Last Visit: 254 lb Weight Lost Since Last Visit: 5 lb Weight Gained Since Last Visit: 0 Starting Weight: 249 lb Total Weight Loss (lbs): 0 lb (0 kg) Peak Weight: 249 lb   Body Composition  Body Fat %: 40.6 % Fat Mass (lbs): 101.2 lbs Muscle Mass (lbs): 140.8 lbs Total Body Water (lbs): 91.4 lbs Visceral Fat Rating : 8   Other Clinical Data Fasting: yes Labs: no Today's Visit #: 16 Starting Date: 06/04/22     HPI  Chief Complaint: OBESITY  Renee Malone is here to discuss her progress with her obesity treatment plan. She is on the keeping a food journal and adhering to recommended goals of 1500-1600 calories and 90+ grams of protein and states she is following her eating plan approximately 40-50 % of the time. She states she is exercising 0 minutes 0 times per week.   Interval History:  Since last office visit she is down 5 lbs.  Bio impedence scale reviewed with the patient:   Has been very diligent in avoiding sugars, avoid sweetened beverages.   The patient, with a history of obesity, prediabetes, vitamin D deficiency, and emotional eating behavior, presents for a follow-up visit. She reports discontinuing metformin due to gastrointestinal side effects, which became troublesome at work. She has been trying to manage her prediabetes through dietary changes, including reducing sugar intake, avoiding candy bars and sugar-filled sodas, and substituting sugar in her homemade desserts. Despite these efforts, she acknowledges the ongoing challenge of managing her prediabetes without medication.  The patient also discusses significant stressors in her life, including her job and her relationship. She is  considering looking for a new job due to the current stress level at work. Additionally, she is experiencing relationship difficulties with her husband, which is causing her significant emotional distress. She is contemplating ending the relationship, indicating a high level of stress and emotional turmoil.  Pharmacotherapy: metformin- stopped due to GI upset  Stopped topamax due to side effects- fatigue/tiredness  Wellbutrin for emotional eating behaviors/cravings. No Side effects.   TREATMENT PLAN FOR OBESITY: Obesity Patient has lost 5.2 pounds of adipose tissue and maintained muscle mass. Visceral adipose rating has decreased. However, stress levels may be impacting weight loss efforts. -Continue current weight loss efforts. -Consider stress management techniques and potentially seeking counseling.  Recommended Dietary Goals  Renee Malone is currently in the action stage of change. As such, her goal is to continue weight management plan. She has agreed to keeping a food journal and adhering to recommended goals of 1500-1600 calories and 90+ grams of  protein.  Behavioral Intervention  We discussed the following Behavioral Modification Strategies today: continue to work on maintaining a reduced calorie state, getting the recommended amount of protein, incorporating whole foods, making healthy choices, staying well hydrated and practicing mindfulness when eating..  Additional resources provided today: NA  Recommended Physical Activity Goals  Renee Malone has been advised to work up to 150 minutes of moderate intensity aerobic activity a week and strengthening exercises 2-3 times per week for cardiovascular health, weight loss maintenance and preservation of muscle mass.   She has agreed to Patient will begin to exercise walking 20 minutes 3 times weekly and Increase physical activity in their  day and reduce sedentary time (increase NEAT).   Pharmacotherapy We discussed various medication  options to help Renee Malone with her weight loss efforts and we both agreed to stop metformin for prediabetes as having GI side effects and continue wellbutrin for emotional eating/cravings and continue to work on nutritional and behavioral strategies to promote weight loss.     Return in about 4 weeks (around 08/26/2023).Marland Kitchen She was informed of the importance of frequent follow up visits to maximize her success with intensive lifestyle modifications for her multiple health conditions.  PHYSICAL EXAM:  Blood pressure 100/66, pulse 60, temperature 98.3 F (36.8 C), height 6\' 3"  (1.905 m), weight 249 lb (112.9 kg), SpO2 97%. Body mass index is 31.12 kg/m.  General: She is overweight, cooperative, alert, well developed, and in no acute distress. PSYCH: Has normal mood, affect and thought process.   Cardiovascular: HR 60's BP 100/66 Lungs: Normal breathing effort, no conversational dyspnea. Neuro: no focal deficits  DIAGNOSTIC DATA REVIEWED:  BMET    Component Value Date/Time   NA 140 07/01/2023 0814   K 4.2 07/01/2023 0814   CL 101 07/01/2023 0814   CO2 22 07/01/2023 0814   GLUCOSE 93 07/01/2023 0814   GLUCOSE 95 12/31/2022 1201   BUN 11 07/01/2023 0814   CREATININE 1.11 (H) 07/01/2023 0814   CREATININE 1.27 (H) 12/31/2022 1201   CALCIUM 9.5 07/01/2023 0814   GFRNONAA >60 07/22/2022 1655   GFRNONAA 63 01/30/2021 0000   GFRAA 73 01/30/2021 0000   Lab Results  Component Value Date   HGBA1C 5.9 (H) 07/01/2023   HGBA1C 6.1 (H) 01/01/2022   Lab Results  Component Value Date   INSULIN 10.9 07/01/2023   INSULIN 17.5 06/04/2022   Lab Results  Component Value Date   TSH 1.780 07/01/2023   CBC    Component Value Date/Time   WBC 4.4 07/01/2023 0814   WBC 6.7 12/31/2022 1201   RBC 4.00 07/01/2023 0814   RBC 4.41 12/31/2022 1201   HGB 12.4 07/01/2023 0814   HCT 37.0 07/01/2023 0814   PLT 265 07/01/2023 0814   MCV 93 07/01/2023 0814   MCH 31.0 07/01/2023 0814   MCH 30.8  12/31/2022 1201   MCHC 33.5 07/01/2023 0814   MCHC 34.0 12/31/2022 1201   RDW 12.9 07/01/2023 0814   Iron Studies No results found for: "IRON", "TIBC", "FERRITIN", "IRONPCTSAT" Lipid Panel     Component Value Date/Time   CHOL 177 07/01/2023 0814   TRIG 76 07/01/2023 0814   HDL 63 07/01/2023 0814   CHOLHDL 2.7 12/31/2022 1201   VLDL 31 (H) 11/14/2016 1613   LDLCALC 100 (H) 07/01/2023 0814   LDLCALC 104 (H) 12/31/2022 1201   Hepatic Function Panel     Component Value Date/Time   PROT 6.7 07/01/2023 0814   ALBUMIN 4.4 07/01/2023 0814   AST 39 07/01/2023 0814   ALT 20 07/01/2023 0814   ALKPHOS 48 07/01/2023 0814   BILITOT 0.2 07/01/2023 0814      Component Value Date/Time   TSH 1.780 07/01/2023 0814   Nutritional Lab Results  Component Value Date   VD25OH 55.4 07/01/2023   VD25OH 52.5 02/28/2023   VD25OH 38.3 10/17/2022    ASSOCIATED CONDITIONS ADDRESSED TODAY  ASSESSMENT AND PLAN  Problem List Items Addressed This Visit     Depression   Relevant Medications   buPROPion (WELLBUTRIN XL) 300 MG 24 hr tablet   Prediabetes - Primary   Vitamin D deficiency   Relevant Medications  Vitamin D, Ergocalciferol, (DRISDOL) 1.25 MG (50000 UNIT) CAPS capsule   Obesity-- Start BMI 31.12   BMI 31.0-31.9,adult Current BMI 31.4  Prediabetes Discontinued Metformin due to gastrointestinal side effects. Patient has made dietary modifications to reduce sugar intake. A1c to be monitored in a few months. -Continue dietary modifications to reduce sugar intake. Continue working on nutrition plan to decrease simple carbohydrates, increase lean proteins and exercise to promote weight loss, improve glycemic control and prevent progression to Type 2 diabetes.  -Check A1c in a few months.  Vitamin D Deficiency Vitamin D is at goal of 50.  Most recent vitamin D level was 55.4. She is on  prescription ergocalciferol 50,000 IU every 14 days. Lab Results  Component Value Date   VD25OH  55.4 07/01/2023   VD25OH 52.5 02/28/2023   VD25OH 38.3 10/17/2022    Plan: Continue and refill  prescription ergocalciferol 50,000 IU every 14 days Low vitamin D levels can be associated with adiposity and may result in leptin resistance and weight gain. Also associated with fatigue. Currently on vitamin D supplementation without any adverse effects.  Recheck vitamin D levels several times yearly to optimize supplementation/avoid over supplementation. Plan to recheck in ~ 2 months.   General Health Maintenance -Consider obtaining a flu shot. -Contact Infectious Disease for potential follow-up appointment. -Schedule follow-up appointment in 4 weeks on November 18th, 2024.  ATTESTASTION STATEMENTS:  Reviewed by clinician on day of visit: allergies, medications, problem list, medical history, surgical history, family history, social history, and previous encounter notes.   I have personally spent 30 minutes total time today in preparation, patient care, nutritional counseling and documentation for this visit, including the following: review of clinical lab tests; review of medical tests/procedures/services.      Eveline Sauve, PA-C

## 2023-08-25 ENCOUNTER — Other Ambulatory Visit (INDEPENDENT_AMBULATORY_CARE_PROVIDER_SITE_OTHER): Payer: Self-pay | Admitting: Physician Assistant

## 2023-08-25 DIAGNOSIS — E559 Vitamin D deficiency, unspecified: Secondary | ICD-10-CM

## 2023-08-26 ENCOUNTER — Encounter (INDEPENDENT_AMBULATORY_CARE_PROVIDER_SITE_OTHER): Payer: Self-pay | Admitting: Physician Assistant

## 2023-08-26 ENCOUNTER — Ambulatory Visit (INDEPENDENT_AMBULATORY_CARE_PROVIDER_SITE_OTHER): Payer: 59 | Admitting: Physician Assistant

## 2023-08-26 VITALS — BP 104/68 | HR 81 | Temp 98.3°F | Ht 75.0 in | Wt 255.0 lb

## 2023-08-26 DIAGNOSIS — R7303 Prediabetes: Secondary | ICD-10-CM

## 2023-08-26 DIAGNOSIS — F32A Depression, unspecified: Secondary | ICD-10-CM

## 2023-08-26 DIAGNOSIS — E669 Obesity, unspecified: Secondary | ICD-10-CM | POA: Diagnosis not present

## 2023-08-26 DIAGNOSIS — E559 Vitamin D deficiency, unspecified: Secondary | ICD-10-CM

## 2023-08-26 DIAGNOSIS — F3289 Other specified depressive episodes: Secondary | ICD-10-CM

## 2023-08-26 DIAGNOSIS — Z6831 Body mass index (BMI) 31.0-31.9, adult: Secondary | ICD-10-CM

## 2023-08-26 MED ORDER — VITAMIN D (ERGOCALCIFEROL) 1.25 MG (50000 UNIT) PO CAPS: 50000.0000 [IU] | ORAL_CAPSULE | ORAL | 0 refills | Status: DC

## 2023-08-26 MED ORDER — BUPROPION HCL ER (XL) 300 MG PO TB24
300.0000 mg | ORAL_TABLET | Freq: Every day | ORAL | 0 refills | Status: DC
Start: 2023-08-26 — End: 2023-09-30

## 2023-08-26 NOTE — Progress Notes (Signed)
SUBJECTIVE:  Chief Complaint: Obesity  Interim History: Renee Malone is a 37 yo female with a history of obesity, prediabetes, vitamin D deficiency, and emotional eating behaviors, presents for a follow-up visit regarding her obesity treatment plan. She reports a recent increase in weight, attributing it to stress and a lack of mindfulness in her eating habits.  She describes a period of 'being on holiday' where she was not actively aware of her eating habits and was not as mindful as she usually is.  The patient acknowledges the need to get back on track with her nutrition plan and exercise regimen.  Bio impedence scale reviewed with the patient:   a decrease in muscle mass by 1 pound and an increase in adipose tissue by 6.6 pounds. She has gained approximately six pounds, which she attributes to stress and a lack of mindfulness in her eating habits.  The patient also reports significant stress related to personal and financial issues. She describes a challenging home environment with a partner who is not contributing to the household, causing additional stress. This stress has led to a decrease in her adherence to her medication regimen, including her bupropion XL 300 mg once daily and ergocalciferol fifty thousand units weekly.  Despite these challenges, the patient expresses a desire to regain control over her health and well-being. She plans to increase her physical activity, improve her adherence to her nutrition plan, and manage her stress more effectively. She also plans to refill her medications and get back on track with her treatment plan.  Renee Malone is here to discuss her progress with her obesity treatment plan. She is on the keeping a food journal and adhering to recommended goals of 1500-1600 calories and 90 grams of protein and states she is following her eating plan approximately 0 % of the time. She states she is not exercising 0 minutes 0 times per week.   OBJECTIVE: Visit  Diagnoses: Problem List Items Addressed This Visit     Depression   Relevant Medications   buPROPion (WELLBUTRIN XL) 300 MG 24 hr tablet   Prediabetes - Primary   Vitamin D deficiency   Relevant Medications   Vitamin D, Ergocalciferol, (DRISDOL) 1.25 MG (50000 UNIT) CAPS capsule   Obesity-- Start BMI 31.12   BMI 31.0-31.9,adult Current BMI 31.4  Obesity Gained six pounds since the last visit, likely due to stress and lack of physical activity. Stress impacting adherence to nutrition plan and and exercise. . Discussed self-care and stress management techniques. Prefers not to journal due to mental fatigue.  Change to category 3 for nutrition plan to decrease stress of journaling currently  - Encourage healthy eating habits and portion control, especially during holidays. - Recommend 30-60 minutes of daily exercise, including gym workouts . - Follow-up on September 30, 2023, at 7:30 AM.  Emotional Eating Reports stress-related eating behaviors and difficulty with mindful eating. Significant stress from personal and financial issues affecting dietary choices.Currently on bupropion XL 300 mg daily without side effects.   Discussed potential benefits of counseling or therapy for stress management. Considering online counseling due to time constraints. -  Continue and Refill bupropion XL 300 mg daily. - Encourage mindfulness and stress management techniques. - Discuss benefits of counseling or therapy for stress management. - Consider online counseling options.  Prediabetes Lab Results  Component Value Date   HGBA1C 5.9 (H) 07/01/2023    Previously started on metformin, which was not well tolerated. Focus on dietary modifications and exercise to manage condition.  Discussed reducing simple carbohydrates and increasing lean proteins. - Continue dietary modifications to reduce simple carbohydrates and increase lean proteins and exercise to promote weight loss. . - Encourage regular physical  activity to manage blood glucose levels.  Vitamin D Deficiency Vitamin D is at goal of 50.  Most recent vitamin D level was 55.4. She is on  prescription ergocalciferol 50,000 IU every 14 days. No N/V or muscle weakness on Ergocalciferol.   Lab Results  Component Value Date   VD25OH 55.4 07/01/2023   VD25OH 52.5 02/28/2023   VD25OH 38.3 10/17/2022    Plan: Continue and refill  prescription ergocalciferol 50,000 IU every 14 days Low vitamin D levels can be associated with adiposity and may result in leptin resistance and weight gain. Also associated with fatigue. Currently on vitamin D supplementation without any adverse effects.  Recheck vitamin D levels several times yearly to optimize supplementation/avoid over supplementation.   General Health Maintenance Needs to focus on stress management and maintaining a healthy lifestyle. Discussed importance of adequate sleep and self-care. - Ensure 7-8 hours of sleep per night. - Encourage self-care and personal well-being. - Maintain a healthy lifestyle with regular exercise and a balanced diet.  Vitals Temp: 98.3 F (36.8 C) BP: 104/68 Pulse Rate: 81 SpO2: 99 %   Anthropometric Measurements Height: 6\' 3"  (1.905 m) Weight: 255 lb (115.7 kg) BMI (Calculated): 31.87 Weight at Last Visit: 249 lb Weight Lost Since Last Visit: 0 Weight Gained Since Last Visit: 6 lb Starting Weight: 249 lb Total Weight Loss (lbs): 0 lb (0 kg) Peak Weight: 257 lb   Body Composition  Body Fat %: 42.3 % Fat Mass (lbs): 107.8 lbs Muscle Mass (lbs): 139.8 lbs Total Body Water (lbs): 94.8 lbs Visceral Fat Rating : 9   Other Clinical Data Fasting: yes Labs: no Today's Visit #: 17 Starting Date: 06/04/22    General: She is overweight, cooperative, alert, well developed, and in no acute distress. PSYCH: Has normal mood, affect and thought process.   Cardiovascular: HR 80's BP 104/68 Lungs: Normal breathing effort, no conversational  dyspnea. Neuro: no focal deficits  ASSESSMENT AND PLAN:  Diet: Renee Malone is currently in the action stage of change. As such, her goal is to continue with weight loss efforts. She has agreed to Category 3 Plan and keeping a food journal and adhering to recommended goals of 1500-1600 calories and 90 grams of protein.  Exercise: Renee Malone has been instructed to work up to a goal of 150 minutes of combined cardio and strengthening exercise per week for weight loss and overall health benefits.   Behavior Modification:  We discussed the following Behavioral Modification Strategies today: increasing lean protein intake, decreasing simple carbohydrates, increasing vegetables, increase H2O intake, increase high fiber foods, no skipping meals, better snacking choices, emotional eating strategies , and holiday eating strategies. We discussed various medication options to help Renee Malone with her weight loss efforts and we both agreed to continue to work on nutritional and behavioral strategies to promote weight loss.  .  Return in about 4 weeks (around 09/23/2023).Marland Kitchen She was informed of the importance of frequent follow up visits to maximize her success with intensive lifestyle modifications for her multiple health conditions.  Attestation Statements:   Reviewed by clinician on day of visit: allergies, medications, problem list, medical history, surgical history, family history, social history, and previous encounter notes.   Time spent on visit including pre-visit chart review and post-visit care and charting was 45 minutes.  Ellyssa Zagal, PA-C

## 2023-09-29 NOTE — Progress Notes (Unsigned)
SUBJECTIVE:  Chief Complaint: Obesity  Interim History: She is up 7 lbs since her last visit.   Renee Malone is a 36 yo individual with a history of prediabetes, vitamin D deficiency, and emotional disorder, presents for a follow-up visit regarding her obesity treatment plan.  Weight gain of 7 lbs- increased muscle mass + 1.8 lbs from recent gym workouts but also +  5.2 lb increase in adipose mass.  She has been engaging in 40-minute walk and run sessions on the treadmill. However, she also notes increased hunger and stress levels, which she believes may be contributing to her weight gain. The stress is described as a combination of work-related and personal life pressures.  She has been exploring strategies to mitigate stress, including physical activities such as gym workouts and online gaming sessions with friends. Despite these efforts, she reports suboptimal sleep quality. She has previously tried Topamax for increased cravings/sleep and is willing to consider restarting it. She denies any comfort eating or baking, although she notes that home baking has previously helped her lose weight by satisfying her sweet tooth in a controlled manner. She has been experiencing sugar cravings recently.  She is currently taking Wellbutrin and vitamin D, with no reported issues. Her last vitamin D level was satisfactory, and she is currently on a regimen of once every fourteen days to maintain levels during the winter months.   She is considering adding weight training to her gym routine, which currently focuses on cardio. She also plans to increase the incline on her treadmill for a more challenging workout.  We discussed referral to behavioral health today to help with increased stress over the past year and she was agreeable to referral and was referred for therapy.  Renee Malone is here to discuss her progress with her obesity treatment plan. She is on the Category 3 Plan and keeping a food journal  and adhering to recommended goals of 1500-1600 calories and 90 grams of  protein and states she is following her eating plan approximately 50 % of the time. She states she is exercising gym treadmill walking 40 minutes 1 times per week.   OBJECTIVE: Visit Diagnoses: Problem List Items Addressed This Visit     Depression   Relevant Medications   buPROPion (WELLBUTRIN XL) 300 MG 24 hr tablet   topiramate (TOPAMAX) 25 MG tablet   Other Relevant Orders   Ambulatory referral to Psychology   Prediabetes - Primary   Vitamin D deficiency   Relevant Medications   Vitamin D, Ergocalciferol, (DRISDOL) 1.25 MG (50000 UNIT) CAPS capsule   Obesity-- Start BMI 31.12   Other hyperlipidemia  Obesity Increase in adipose tissue likely exacerbated by stress and suboptimal sleep. Engaging in physical activity, including 40-minute treadmill sessions, with increased muscle mass. Stress from work and home contributing to emotional eating and cravings. Discussed potential benefits of resuming comfort baking with Splenda to manage sweet cravings. Discussed starting topiramate 25 mg at bedtime to help with cravings and improve sleep. - Continue gym workouts, including treadmill sessions and consider adding weight training. - Increase treadmill incline to 15 degrees to enhance workout. - Consider resuming comfort baking with Splenda to manage sweet cravings. - Start topiramate 25 mg at bedtime to help with cravings and improve sleep.  Stress/Depressed mood with emotional eating/cravings.  Increased stress from work and home leading to emotional eating and cravings. Experiencing suboptimal sleep and exhaustion. On Wellbutrin and feels it is somewhat helpful.  Discussed resuming topamax to help with  sleep as well as emotional eating and is agreeable to resume topamax 25 mg nightly.    Engaging in gym workouts and online gaming to manage stress. Discussed referral to therapy for emotional support and stress  management, ensuring virtual options are available. - Refill/continue  Wellbutrin XL 300 mg daily.   Resume/refill topamax 25 mg nightly- patient reports still has some topamax .  - Referral to therapy for emotional support and stress management. Ensure virtual options are available.  Prediabetes Lab Results  Component Value Date   HGBA1C 5.9 (H) 07/01/2023   HGBA1C 6.1 (H) 02/28/2023   HGBA1C 5.8 (H) 10/17/2022   Lab Results  Component Value Date   MICROALBUR 0.7 07/07/2020   LDLCALC 100 (H) 07/01/2023   CREATININE 1.11 (H) 07/01/2023    Current lifestyle modifications include increased physical activity and dietary adjustments. Continue working on nutrition plan to decrease simple carbohydrates, increase lean proteins and exercise to promote weight loss, improve glycemic control and prevent progression to Type 2 diabetes.  Recheck A1c and insulin levels over the next 2-3 months.   Vitamin D Deficiency Vitamin D is at goal of 50.  Most recent vitamin D level was 55.4. She is on  prescription ergocalciferol 50,000 IU every 14 days. No N/V or muscle weakness with vitamin D.  Lab Results  Component Value Date   VD25OH 55.4 07/01/2023   VD25OH 52.5 02/28/2023   VD25OH 38.3 10/17/2022    Plan: Continue and refill  prescription ergocalciferol 50,000 IU every 14 days Low vitamin D levels can be associated with adiposity and may result in leptin resistance and weight gain. Also associated with fatigue. Currently on vitamin D supplementation without any adverse effects.  Recheck vitamin D level over the next 2-3 months to optimize supplementation/avoid over supplementation.   General Health Maintenance Engaging in physical activity and attempting to manage stress through various outlets. Considering dietary modifications to manage cravings. - Encourage continued physical activity and stress management techniques. - Advise on the importance of adequate sleep and relaxation during the  holiday break.    Vitals Temp: 98.1 F (36.7 C) BP: 126/84 Pulse Rate: 67 SpO2: 99 %   Anthropometric Measurements Height: 6\' 3"  (1.905 m) Weight: 262 lb (118.8 kg) BMI (Calculated): 32.75 Weight at Last Visit: 255lb Weight Lost Since Last Visit: 0lb Weight Gained Since Last Visit: 7lb Starting Weight: 249 lb Total Weight Loss (lbs): 0 lb (0 kg) Peak Weight: 257 lb   Body Composition  Body Fat %: 43.1 % Fat Mass (lbs): 113 lbs Muscle Mass (lbs): 141.6 lbs Total Body Water (lbs): 98 lbs Visceral Fat Rating : 9   Other Clinical Data Fasting: no Labs: no Today's Visit #: 18 Starting Date: 06/04/22     ASSESSMENT AND PLAN:  Diet: Renee Malone is currently in the action stage of change. As such, her goal is to continue with weight loss efforts. She has agreed to Category 3 Plan and keeping a food journal and adhering to recommended goals of 1500-1600 calories and 90 grams of protein.  Exercise: Renee Malone has been instructed to work up to a goal of 150 minutes of combined cardio and strengthening exercise per week for weight loss and overall health benefits.   Behavior Modification:  We discussed the following Behavioral Modification Strategies today: increasing lean protein intake, decreasing simple carbohydrates, increasing vegetables, increase H2O intake, increase high fiber foods, meal planning and cooking strategies, emotional eating strategies , holiday eating strategies, planning for success, and keep a strict  food journal. We discussed various medication options to help Renee Malone with her weight loss efforts and we both agreed to resume topamax for cravings/emotional eating and continue Wellbutrin for mood/emotional eating. We also discussed referral to behavioral health today. .  Return in about 4 weeks (around 10/28/2023).Marland Kitchen She was informed of the importance of frequent follow up visits to maximize her success with intensive lifestyle modifications for her multiple  health conditions.  Attestation Statements:   Reviewed by clinician on day of visit: allergies, medications, problem list, medical history, surgical history, family history, social history, and previous encounter notes.   Time spent on visit including pre-visit chart review and post-visit care and charting was 35 minutes.    Renee Mcmanamon, PA-C

## 2023-09-30 ENCOUNTER — Other Ambulatory Visit (INDEPENDENT_AMBULATORY_CARE_PROVIDER_SITE_OTHER): Payer: Self-pay | Admitting: Physician Assistant

## 2023-09-30 ENCOUNTER — Ambulatory Visit (INDEPENDENT_AMBULATORY_CARE_PROVIDER_SITE_OTHER): Payer: 59 | Admitting: Physician Assistant

## 2023-09-30 ENCOUNTER — Encounter (INDEPENDENT_AMBULATORY_CARE_PROVIDER_SITE_OTHER): Payer: Self-pay | Admitting: Physician Assistant

## 2023-09-30 VITALS — BP 126/84 | HR 67 | Temp 98.1°F | Ht 75.0 in | Wt 262.0 lb

## 2023-09-30 DIAGNOSIS — F439 Reaction to severe stress, unspecified: Secondary | ICD-10-CM | POA: Diagnosis not present

## 2023-09-30 DIAGNOSIS — E559 Vitamin D deficiency, unspecified: Secondary | ICD-10-CM

## 2023-09-30 DIAGNOSIS — F5089 Other specified eating disorder: Secondary | ICD-10-CM

## 2023-09-30 DIAGNOSIS — E669 Obesity, unspecified: Secondary | ICD-10-CM

## 2023-09-30 DIAGNOSIS — Z6832 Body mass index (BMI) 32.0-32.9, adult: Secondary | ICD-10-CM

## 2023-09-30 DIAGNOSIS — R7303 Prediabetes: Secondary | ICD-10-CM

## 2023-09-30 DIAGNOSIS — F3289 Other specified depressive episodes: Secondary | ICD-10-CM

## 2023-09-30 DIAGNOSIS — E7849 Other hyperlipidemia: Secondary | ICD-10-CM

## 2023-09-30 MED ORDER — BUPROPION HCL ER (XL) 300 MG PO TB24: 300.0000 mg | ORAL_TABLET | Freq: Every day | ORAL | 0 refills | Status: DC

## 2023-09-30 MED ORDER — TOPIRAMATE 25 MG PO TABS: 25.0000 mg | ORAL_TABLET | Freq: Every day | ORAL | 0 refills | Status: DC

## 2023-09-30 MED ORDER — VITAMIN D (ERGOCALCIFEROL) 1.25 MG (50000 UNIT) PO CAPS: 50000.0000 [IU] | ORAL_CAPSULE | ORAL | 0 refills | Status: DC

## 2023-11-04 ENCOUNTER — Ambulatory Visit (INDEPENDENT_AMBULATORY_CARE_PROVIDER_SITE_OTHER): Payer: Self-pay | Admitting: Physician Assistant

## 2023-11-04 NOTE — Progress Notes (Deleted)
   SUBJECTIVE:  Chief Complaint: Obesity  Interim History: ***  Renee Malone is here to discuss her progress with her obesity treatment plan. She is on the {HWW Weight Loss Plan:210964005} and states she {CHL AMB IS/IS NOT:210130109} following her eating plan approximately *** % of the time. She states she {CHL AMB IS/IS NOT:210130109} exercising *** minutes *** times per week.   OBJECTIVE: Visit Diagnoses: Problem List Items Addressed This Visit     Depression   Prediabetes - Primary   Vitamin D deficiency   Obesity-- Start BMI 31.12   Other hyperlipidemia    No data recorded No data recorded No data recorded No data recorded   ASSESSMENT AND PLAN:  Diet: Nel {CHL AMB IS/IS NOT:210130109} currently in the action stage of change. As such, her goal is to {HWW Weight Loss Efforts:210964006}. She {HAS HAS YNW:29562} agreed to {HWW Weight Loss Plan:210964005}.  Exercise: [place] has been instructed {HWW Exercise:210964007} for weight loss and overall health benefits.   Behavior Modification:  We discussed the following Behavioral Modification Strategies today: {HWW Behavior Modification:210964008}. We discussed various medication options to help Bernyce with her weight loss efforts and we both agreed to ***.  No follow-ups on file.Marland Kitchen She was informed of the importance of frequent follow up visits to maximize her success with intensive lifestyle modifications for her multiple health conditions.  Attestation Statements:   Reviewed by clinician on day of visit: allergies, medications, problem list, medical history, surgical history, family history, social history, and previous encounter notes.   Time spent on visit including pre-visit chart review and post-visit care and charting was *** minutes.    Falyn Rubel, PA-C

## 2023-11-04 NOTE — Progress Notes (Unsigned)
SUBJECTIVE:  Chief Complaint: Obesity  Interim History: She is up 1 lb since last visit.  Bio impedence scale reviewed with the patient:  Muscle mass + 0.6 lbs Adipose mass + 0.4 lbs Total body water -1.2 lbs  Renee Malone is a 38 year old female who presents for follow-up of her obesity treatment plan. She has gained one pound since her last visit . She is not strictly following her nutrition plan, particularly with occasional overconsumption of low sugar cookies. She is attempting to manage her protein intake, aiming for over 100 grams per day, but does not journal her food intake, relying instead on mental calculations.  She has a history of prediabetes, with her last A1c and insulin resistance levels checked in September 2024 were showing improvement, but not yet at goals. She is concerned about her creatinine levels and is unsure how to manage them.  She is currently taking Wellbutrin and has resumed Topamax, although she struggles to remember to take the topamax consistently. She takes vitamin D every fourteen days without side effects and is on [place], which she needs refilled and transferred to a different pharmacy due to insurance changes. She is going to contact ID for her other refills.   Her work is less stressful recently, but she mentions family stress due to her grandmother's recent hospitalization and strokes. 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 She manages her stress by staying proactive and organized.  Renee Malone is here to discuss her progress with her obesity treatment plan. She is on the Category 3 Plan and keeping a food journal and adhering to recommended goals of 1500 calories and 100 grams of protein and states she is following her eating plan approximately 30 % of the time. She states she is not exercising walking/aerobics 30 minutes 1-2 times per week.  Plan fasting labs and fasting IC at next  visit. Patient was advised to come in 30 minutes early for appointment to complete IC testing.   OBJECTIVE: Visit Diagnoses: Problem List Items Addressed This Visit     Depression   Relevant Medications   buPROPion (WELLBUTRIN XL) 300 MG 24 hr tablet   Prediabetes - Primary   Vitamin D deficiency   Relevant Medications   Vitamin D, Ergocalciferol, (DRISDOL) 1.25 MG (50000 UNIT) CAPS capsule   Obesity-- Start BMI 31.12   Other Visit Diagnoses       BMI 32.0-32.9,adult Current BMI 32.9          Obesity 38 year old with obesity, currently up 0.4 pounds. Increasing physical activity and muscle mass but struggling with adherence to nutrition plan, particularly with emotional eating and sweets. Discussed importance of protein intake (100+ grams/day) and maintaining overall caloric intake around 1500 calories/day. Risks of not managing weight include type 2 diabetes, kidney disease, and liver disease. Prefers not to journal but counts calories mentally. Discussed potential medication-related weight gain and need for fasting metabolic test and labs next visit. - Continue current exercise regimen - Focus on meeting protein needs (100+ grams/day) - Maintain caloric intake around 1500 calories/day - Consider journaling food intake - Recheck fasting metabolic test and labs next visit - Follow up in one month for fasting metabolic test and labs  Emotional Eating Struggles with emotional eating, particularly with sweets. Discussed strategies to manage cravings and importance of not bringing sweets into the house. Wellbutrin continued. Topamax discussed as a potential aid for cravings but forgets to take  it regularly. - Consider behavioral strategies to manage emotional eating such as doing other activities you enjoy that are not food related.  - Continue/refill Wellbutrin XL 300 mg daily.  - Attempt to take Topamax regularly 25 mg at bedtime- Reports no refill needed.  - Place Topamax at bedside  or take with evening meal if you feel it is too sedating.   Prediabetes Prediabetes with previous A1c and insulin levels showing improvement. Last checked in September, A1c and insulin levels were better but need re-evaluation due to recent dietary inconsistencies. Discussed importance of controlling blood sugar to prevent complications/progression to Type 2 diabetes. Lab Results  Component Value Date   HGBA1C 5.9 (H) 07/01/2023   HGBA1C 6.1 (H) 02/28/2023   HGBA1C 5.8 (H) 10/17/2022   Lab Results  Component Value Date   MICROALBUR 0.7 07/07/2020   LDLCALC 100 (H) 07/01/2023   CREATININE 1.11 (H) 07/01/2023   INSULIN  Date Value Ref Range Status  07/01/2023 10.9 2.6 - 24.9 uIU/mL Final  ] - Recheck A1c and fasting insulin levels next visit and CMET and Lipid panel Continue working on nutrition plan to decrease simple carbohydrates, increase lean proteins and exercise to promote weight loss, improve glycemic control and prevent progression to Type 2 diabetes.     Vitamin D Deficiency Last vitamin D Lab Results  Component Value Date   VD25OH 55.4 07/01/2023    On vitamin D supplementation (once every 14 days).  - Continue/refill vitamin D supplementation (once every 14 days) - Recheck vitamin D levels next visit  General Health Maintenance Discussed importance of maintaining overall health to prevent complications such as type 2 diabetes, kidney disease, and liver disease. Encouraged to stay hydrated to help with creatinine levels. - Stay hydrated - Monitor overall health and wellness  Follow-up - Follow up on Wednesday, December 04, 2023, at 8:00 AM for fasting metabolic test and labs. Vitals Temp: 98.2 F (36.8 C) BP: 110/66 Pulse Rate: 66 SpO2: 99 %   Anthropometric Measurements Height: 6\' 3"  (1.905 m) Weight: 263 lb (119.3 kg) BMI (Calculated): 32.87 Weight at Last Visit: 262lb Weight Lost Since Last Visit: 0 Weight Gained Since Last Visit: 1lb Starting  Weight: 249lb Total Weight Loss (lbs): 0 lb (0 kg) Peak Weight: 257lb   Body Composition  Body Fat %: 43.1 % Fat Mass (lbs): 113.4 lbs Muscle Mass (lbs): 142.2 lbs Total Body Water (lbs): 96.8 lbs Visceral Fat Rating : 9   Other Clinical Data Fasting: no Labs: no Today's Visit #: 19lb Starting Date: 06/04/22     ASSESSMENT AND PLAN:  Diet: Renee Malone is currently in the action stage of change. As such, her goal is to continue with weight loss efforts. She has agreed to Category 3 Plan and keeping a food journal and adhering to recommended goals of 1500 calories and 100+ grams of protein.  Exercise: Renee Malone has been instructed to work up to a goal of 150 minutes of combined cardio and strengthening exercise per week for weight loss and overall health benefits.   Behavior Modification:  We discussed the following Behavioral Modification Strategies today: increasing lean protein intake, decreasing simple carbohydrates, increasing vegetables, increase H2O intake, decrease liquid calories, no skipping meals, meal planning and cooking strategies, emotional eating strategies , avoiding temptations, planning for success, and keep a strict food journal. We discussed various medication options to help Renee Malone with her weight loss efforts and we both agreed to continue Wellbutrin for emotional eating/cravings and resume topamax at bedtime for  cravings and continue to work on nutritional and behavioral strategies to promote weight loss.  .  Return in about 4 weeks (around 12/03/2023) for Fasting IC, Fasting Lab.Marland Kitchen She was informed of the importance of frequent follow up visits to maximize her success with intensive lifestyle modifications for her multiple health conditions.  Attestation Statements:   Reviewed by clinician on day of visit: allergies, medications, problem list, medical history, surgical history, family history, social history, and previous encounter notes.   Time spent on  visit including pre-visit chart review and post-visit care and charting was 32 minutes.    Mariely Mahr, PA-C

## 2023-11-05 ENCOUNTER — Encounter (INDEPENDENT_AMBULATORY_CARE_PROVIDER_SITE_OTHER): Payer: Self-pay | Admitting: Physician Assistant

## 2023-11-05 ENCOUNTER — Other Ambulatory Visit: Payer: Self-pay

## 2023-11-05 ENCOUNTER — Ambulatory Visit (INDEPENDENT_AMBULATORY_CARE_PROVIDER_SITE_OTHER): Payer: Self-pay | Admitting: Physician Assistant

## 2023-11-05 VITALS — BP 110/66 | HR 66 | Temp 98.2°F | Ht 75.0 in | Wt 263.0 lb

## 2023-11-05 DIAGNOSIS — E7849 Other hyperlipidemia: Secondary | ICD-10-CM

## 2023-11-05 DIAGNOSIS — F3289 Other specified depressive episodes: Secondary | ICD-10-CM

## 2023-11-05 DIAGNOSIS — E669 Obesity, unspecified: Secondary | ICD-10-CM

## 2023-11-05 DIAGNOSIS — F32A Depression, unspecified: Secondary | ICD-10-CM

## 2023-11-05 DIAGNOSIS — R7303 Prediabetes: Secondary | ICD-10-CM

## 2023-11-05 DIAGNOSIS — E559 Vitamin D deficiency, unspecified: Secondary | ICD-10-CM | POA: Diagnosis not present

## 2023-11-05 DIAGNOSIS — Z6832 Body mass index (BMI) 32.0-32.9, adult: Secondary | ICD-10-CM

## 2023-11-05 MED ORDER — BUPROPION HCL ER (XL) 300 MG PO TB24: 300.0000 mg | ORAL_TABLET | Freq: Every day | ORAL | 0 refills | Status: DC

## 2023-11-05 MED ORDER — BIKTARVY 50-200-25 MG PO TABS: 1.0000 | ORAL_TABLET | Freq: Every day | ORAL | 2 refills | Status: DC

## 2023-11-05 MED ORDER — VITAMIN D (ERGOCALCIFEROL) 1.25 MG (50000 UNIT) PO CAPS
50000.0000 [IU] | ORAL_CAPSULE | ORAL | 0 refills | Status: DC
Start: 2023-11-05 — End: 2023-12-04

## 2023-11-08 ENCOUNTER — Encounter: Payer: Self-pay | Admitting: Internal Medicine

## 2023-11-08 ENCOUNTER — Ambulatory Visit: Payer: 59 | Admitting: Internal Medicine

## 2023-11-08 VITALS — BP 122/78 | HR 80 | Ht 75.0 in | Wt 265.2 lb

## 2023-11-08 DIAGNOSIS — Z789 Other specified health status: Secondary | ICD-10-CM | POA: Diagnosis not present

## 2023-11-08 NOTE — Progress Notes (Unsigned)
Name: Renee Malone  MRN/ DOB: [id number], Dec 06, 1985    Age/ Sex: 38 y.o., adult     PCP: Elie Confer, NP   Reason for Endocrinology Evaluation: Gender dysphoria     Initial Endocrinology Clinic Visit: 10/27/2020    PATIENT IDENTIFIER: Renee Malone is a 38 y.o., adult with a past medical history of HIV, gender dysphoria. She has followed with [facility] since 10/27/2020 for consultative assistance with management of her gender dysphoria.   HISTORICAL SUMMARY: The patient has been on gender affirming therapy since 2010.  She is status post breast reconstruction surgery in 2019.   She is status post vaginoplasty 07/2020 with revision of vaginal introitus and labia 05/2021   Switched [place] to oral estradiol due to rash 01/2022   Great grandmother and great aunt with breast Ca   SUBJECTIVE:    Today (11/08/2023):  Renee Malone is here for for follow-up on gender dysphoria.  Patient is a transgender female.   Patient follows with [facility] Patient follows with infectious disease for continued HIV management Weight has been trending up Denies diarrhea but has occasional constipation  Denies palpitations Attributes breathing issues to deconditioning  NO facial hair regrowth after laser treatment     MEDICATIONS:  Estradiol 2 gm, 2 tabs daily   HISTORY:  Past Medical History:  Past Medical History:  Diagnosis Date   Anxiety    panic attacks   Elevated serum creatinine 06/10/2019   Heart murmur    HIV infection (HCC)    Lactose intolerance    Low hemoglobin 05/25/2020   Multiple food allergies    Prediabetes 05/25/2020   SOBOE (shortness of breath on exertion)    Weight gain 02/12/2022   Past Surgical History:  Past Surgical History:  Procedure Laterality Date   BREAST ENHANCEMENT SURGERY Bilateral 02/25/2018   Procedure: MAMMOPLASTY AUGMENTATION WITH SILICONE IMPLANTS;  Surgeon:  Glenna Fellows, MD;  Location: [facility];  Service: Plastics;  Laterality: Bilateral;   BREAST LUMPECTOMY Right 02/25/2018   Procedure: EXCISION RIGHT BREAST MASS;  Surgeon: Glenna Fellows, MD;  Location: [facility];  Service: Plastics;  Laterality: Right;   SKIN GRAFT     Social History:  reports that she quit smoking about 14 years ago. Her smoking use included cigarettes. She has never used smokeless tobacco. She reports current alcohol use of about 1.0 standard drink of alcohol per week. She reports that she does not use drugs. Family History:  Family History  Problem Relation Age of Onset   Hypertension Mother    Depression Mother    Anxiety disorder Mother    Bipolar disorder Mother    Schizophrenia Mother    Sleep apnea Mother    Drug abuse Mother    Obesity Mother    Hypertension Maternal Grandmother      HOME MEDICATIONS: Allergies as of 11/08/2023       Reactions   Broccoli [brassica Oleracea] Anaphylaxis   Other Anaphylaxis   Cauliflower        Medication List        Accurate as of November 08, 2023  7:08 AM. If you have any questions, ask your nurse or doctor.          ALPRAZolam 1 MG tablet Commonly known as: XANAX TAKE ONE TABLET BY MOUTH EVERY 8 HOURS AS NEEDED   Biktarvy 50-200-25 MG Tabs tablet Generic drug: bictegravir-emtricitabine-tenofovir AF Take 1 tablet by mouth  daily.   buPROPion 300 MG 24 hr tablet Commonly known as: Wellbutrin XL Take 1 tablet (300 mg total) by mouth daily.   estradiol 2 MG tablet Commonly known as: ESTRACE Take 2 tablets (4 mg total) by mouth daily.   hydrocortisone 1 % ointment Apply 1 application topically 2 (two) times daily.   topiramate 25 MG tablet Commonly known as: Topamax Take 1 tablet (25 mg total) by mouth at bedtime.   Vitamin D (Ergocalciferol) 1.25 MG (50000 UNIT) Caps capsule Commonly known as: DRISDOL Take 1 capsule (50,000 Units total) by mouth every 14  (fourteen) days.          OBJECTIVE:   PHYSICAL EXAM: VS: There were no vitals taken for this visit.  EXAM: General: Pt appears well and is in NAD  Neck: General: Supple without adenopathy. Thyroid: Thyroid size normal.  No goiter or nodules appreciated.  Lungs: Clear with good BS bilat   Heart: Auscultation: RRR.  Abdomen: Normoactive bowel sounds, soft, nontender  Extremities:  BL LE: No pretibial edema  Mental Status: Judgment, insight: Intact Orientation: Oriented to time, place, and person Mood and affect: No depression, anxiety, or agitation     DATA REVIEWED:  Latest Reference Range & Units 07/01/23 08:14  Sodium 134 - 144 mmol/L 140  Potassium 3.5 - 5.2 mmol/L 4.2  Chloride 96 - 106 mmol/L 101  CO2 20 - 29 mmol/L 22  Glucose 70 - 99 mg/dL 93  BUN 6 - 20 mg/dL 11  Creatinine 7.82 - 9.56 mg/dL 2.13 (H)  Calcium 8.7 - 10.2 mg/dL 9.5  BUN/Creatinine Ratio 9 - 23  10  eGFR >59 mL/min/1.73 65  Alkaline Phosphatase 44 - 121 IU/L 48  Albumin 3.9 - 4.9 g/dL 4.4  AST 0 - 40 IU/L 39  ALT 0 - 32 IU/L 20  Total Protein 6.0 - 8.5 g/dL 6.7  Total Bilirubin 0.0 - 1.2 mg/dL 0.2     Latest Reference Range & Units 07/01/23 08:14  Cholesterol, Total 100 - 199 mg/dL 086  HDL Cholesterol >57 mg/dL 63  LDL/HDL Ratio 0.0 - 3.2 ratio 1.6  Triglycerides 0 - 149 mg/dL 76  VLDL Cholesterol Cal 5 - 40 mg/dL 14  LDL Chol Calc (NIH) 0 - 99 mg/dL 846 (H)     Latest Reference Range & Units 07/01/23 08:14  Globulin, Total 1.5 - 4.5 g/dL 2.3  WBC 3.4 - 96.2 X52W4/XL 4.4  RBC 3.77 - 5.28 x10E6/uL 4.00  Hemoglobin 11.1 - 15.9 g/dL 24.4  HCT 01.0 - 27.2 % 37.0  MCV 79 - 97 fL 93  MCH 26.6 - 33.0 pg 31.0  MCHC 31.5 - 35.7 g/dL 53.6  RDW 64.4 - 03.4 % 12.9  Platelets 150 - 450 x10E3/uL 265     ASSESSMENT / PLAN / RECOMMENDATIONS:   Female to Female Transgender    -Estrogen level is the low end of normal, this is due to interruption in estradiol intake, will increase the dose  as below and recheck on the next visit -Patient allergic to estrogen patches    Medications   Increase estradiol 2 mg, 2 tabs  daily    Follow-up in 6 months      Signed electronically by: Lyndle Herrlich, MD  [facility] End[facility]  [facility] [address] [place], [place] [address] Phone: [phone] FAX: [phone]      CC: Elie Confer, NP [address] [place] [address] Phone: [phone]  Fax: [phone]  Return to Endocrinology clinic as below: Future Appointments  Date Time Provider Department Center  11/08/2023 11:10 AM Marene Gilliam, Konrad Dolores, MD [facility] None  12/04/2023  8:30 AM Rayburn, Fanny Bien, PA-C [facility] None

## 2023-11-09 LAB — ESTRADIOL: Estradiol: 43 pg/mL

## 2023-11-11 ENCOUNTER — Encounter: Payer: Self-pay | Admitting: Internal Medicine

## 2023-11-11 MED ORDER — ESTRADIOL 2 MG PO TABS: 6.0000 mg | ORAL_TABLET | Freq: Every day | ORAL | 2 refills | Status: DC

## 2023-11-27 ENCOUNTER — Other Ambulatory Visit (INDEPENDENT_AMBULATORY_CARE_PROVIDER_SITE_OTHER): Payer: Self-pay | Admitting: Physician Assistant

## 2023-11-27 DIAGNOSIS — F3289 Other specified depressive episodes: Secondary | ICD-10-CM

## 2023-12-04 ENCOUNTER — Ambulatory Visit (INDEPENDENT_AMBULATORY_CARE_PROVIDER_SITE_OTHER): Payer: 59 | Admitting: Physician Assistant

## 2023-12-04 ENCOUNTER — Encounter (INDEPENDENT_AMBULATORY_CARE_PROVIDER_SITE_OTHER): Payer: Self-pay | Admitting: Physician Assistant

## 2023-12-04 VITALS — BP 102/70 | HR 61 | Temp 98.8°F | Ht 75.0 in | Wt 261.0 lb

## 2023-12-04 DIAGNOSIS — R7303 Prediabetes: Secondary | ICD-10-CM

## 2023-12-04 DIAGNOSIS — R0602 Shortness of breath: Secondary | ICD-10-CM | POA: Diagnosis not present

## 2023-12-04 DIAGNOSIS — F3289 Other specified depressive episodes: Secondary | ICD-10-CM

## 2023-12-04 DIAGNOSIS — Z6832 Body mass index (BMI) 32.0-32.9, adult: Secondary | ICD-10-CM

## 2023-12-04 DIAGNOSIS — E559 Vitamin D deficiency, unspecified: Secondary | ICD-10-CM | POA: Diagnosis not present

## 2023-12-04 DIAGNOSIS — R5383 Other fatigue: Secondary | ICD-10-CM

## 2023-12-04 DIAGNOSIS — E669 Obesity, unspecified: Secondary | ICD-10-CM

## 2023-12-04 DIAGNOSIS — E7849 Other hyperlipidemia: Secondary | ICD-10-CM | POA: Diagnosis not present

## 2023-12-04 MED ORDER — VITAMIN D (ERGOCALCIFEROL) 1.25 MG (50000 UNIT) PO CAPS: 50000.0000 [IU] | ORAL_CAPSULE | ORAL | 0 refills | Status: DC

## 2023-12-04 MED ORDER — BUPROPION HCL ER (XL) 300 MG PO TB24: 300.0000 mg | ORAL_TABLET | Freq: Every day | ORAL | 0 refills | Status: DC

## 2023-12-04 MED ORDER — TOPIRAMATE 25 MG PO TABS: 25.0000 mg | ORAL_TABLET | Freq: Every day | ORAL | 0 refills | Status: AC

## 2023-12-04 NOTE — Progress Notes (Signed)
 SUBJECTIVE: Discussed the use of AI scribe software for clinical note transcription with the patient, who gave verbal consent to proceed.  Chief Complaint: Obesity  Interim History: She is down 2 lbs since last visit.   Renee Malone is here to discuss her progress with her obesity treatment plan. She is on the keeping a food journal and adhering to recommended goals of 1500-1600 calories and 100 grams of protein and states she is following her eating plan approximately 40 % of the time. She states she is exercising walking 60 minutes 4 times per week.  Renee Malone is a 38 year old female who presents for follow-up of her obesity treatment plan.  She is actively working on her obesity treatment plan by increasing physical activity and incorporating holistic approaches such as elderberry gummies for immune support, apple cider vinegar gummies for gut health, and cinnamon extract for blood sugar support. She has seen positive changes in body composition, with increased muscle mass and decreased adipose tissue.  She is attempting to maintain a protein intake of 100 grams per day but finds it challenging to consistently stay within her calorie limit. She often exceeds her calorie target of 1500-1600 calories, typically consuming around 1700 to 1800 calories or more, which may still support weight loss but at a significantly slower rate.  She has a history of prediabetes and is concerned about preventing its progression. She previously took metformin but experienced significant GI upset and discontinued.  She is currently taking Wellbutrin 300 mg daily and ergocalciferol 50,000 units every 14 days. She is mindful of her blood sugar levels and is awaiting lab results to check her A1c and fasting insulin levels.  Her energy levels vary depending on her rest, aiming for at least seven hours of sleep per night. She is trying to manage stress, which she finds challenging due to economic pressures.  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  Fasting labs obtained today.  The patient was informed we would discuss the lab results at the next visit unless there is a critical issue that needs to be addressed sooner. The patient agreed to keep the next visit at the agreed upon time to discuss these results.   Fasting IC repeated today: RMR) has improved from 1642 calories in August 2023 to 1958 calories today, close to the predicted value of 1966 calories.  Currently consuming 1700-1800 calories per day, slightly above the target of 1550-1600 calories for optimal weight loss. Emphasized the importance of maintaining a calorie deficit and continuing physical activity. Discussed that a daily caloric intake of 1600 calories would still promote weight loss, albeit slower, due to increased muscle mass.  OBJECTIVE: Visit Diagnoses: Problem List Items Addressed This Visit     Depression   Relevant Medications   buPROPion (WELLBUTRIN XL) 300 MG 24 hr tablet   topiramate (TOPAMAX) 25 MG tablet   Other fatigue   Relevant Orders   Vitamin B12   CBC with Differential/Platelet   TSH   SOBOE (shortness of breath on exertion) - Primary   Prediabetes   Relevant Orders   CMP14+EGFR   Hemoglobin A1c   Insulin, random   Vitamin D deficiency   Relevant Medications   Vitamin D, Ergocalciferol, (DRISDOL) 1.25 MG (50000 UNIT) CAPS capsule   Other Relevant Orders   VITAMIN D 25 Hydroxy (Vit-D Deficiency, Fractures)   Obesity-- Start BMI 31.12  Other hyperlipidemia   Relevant Orders   Lipid Panel With LDL/HDL Ratio   Other Visit Diagnoses       BMI 32.0-32.9,adult Current BMI 32.6         Obesity with SOBOE Renee Malone is here for follow-up on her obesity treatment plan.  SOBOE: Repeated IC today: Her resting metabolic rate (RMR) has improved from 1642 calories in August 2023 to 1958 calories today, close to the  predicted value of 1966 calories. She has gained nearly 3 pounds of muscle and lost almost 5 pounds of adipose tissue. Currently consuming 1700-1800 calories per day, slightly above the target of 1550-1600 calories for optimal weight loss. Emphasized the importance of maintaining a calorie deficit and continuing physical activity. Discussed that a daily caloric intake of 1600 calories would still promote weight loss, albeit slower, due to increased muscle mass. - Check labs including A1c, fasting insulin, and lipid panel - Continue current exercise regimen - Aim for a daily caloric intake of 1600 calories and 100+ grams of protein daily.  - Follow up on January 02, 2024, at 2 PM  Prediabetes Renee Malone is concerned about preventing the progression of prediabetes. She has been taking a cinnamon extract supplement for blood sugar support. Will check her A1c and fasting insulin levels to monitor her condition. - Check A1c and fasting insulin levels and CMET. Has taken metformin in past,, but had GI upset/diarrhea and stopped.  - Continue cinnamon extract supplement Continue working on nutrition plan to decrease simple carbohydrates, increase lean proteins and exercise to promote weight loss, improve glycemic control and prevent progression to Type 2 diabetes.   Emotional Eating Behavior Renee Malone is currently on Wellbutrin 300 mg daily and topiramate 25 mg at bedtime but does not always take topiramate. She is also managing stress and improving her sleep quality. Discussed the importance of stress management and adequate sleep for overall health and weight management. - Continue/refill Wellbutrin 300 mg daily Continue/refill topiramate 25 mg nightly - Encourage stress management techniques and adequate sleep  Hyperlipidemia LDL is not at goal. Medication(s): None Cardiovascular risk factors: dyslipidemia, obesity (BMI >= 30 kg/m2), and smoking/ tobacco exposure  Lab Results  Component Value Date   CHOL 177  07/01/2023   HDL 63 07/01/2023   LDLCALC 100 (H) 07/01/2023   TRIG 76 07/01/2023   CHOLHDL 2.7 12/31/2022   CHOLHDL 2.6 06/20/2022   CHOLHDL 2.9 06/04/2022   Lab Results  Component Value Date   ALT 20 07/01/2023   AST 39 07/01/2023   ALKPHOS 48 07/01/2023   BILITOT 0.2 07/01/2023   The ASCVD Risk score (Arnett DK, et al., 2019) failed to calculate for the following reasons:   The 2019 ASCVD risk score is only valid for ages 59 to 54  Plan: Will recheck fasting lipids today.  Continue to work on nutrition plan -decreasing simple carbohydrates, increasing lean proteins, decreasing saturated fats and cholesterol , avoiding trans fats and exercise as able to promote weight loss, improve lipids and decrease cardiovascular risks.  Fatigue Endorses fatigue at times, but also very busy with multiple jobs/responsibilities.  Recheck labs today.,    Vitamin D Deficiency Renee Malone is currently taking ergocalciferol 50,000 units every 14 days. - Continue ergocalciferol 50,000 units every 14 days Recheck vitamin D level today and adjust supplement accordingly.  Low vitamin D levels can be associated with adiposity and may result in leptin resistance and weight gain. Also associated with fatigue.  Currently on vitamin D supplementation without any adverse  effects such as nausea, vomiting or muscle weakness.    General Health Maintenance Renee Malone is taking elderberry gummies for immune support and apple cider vinegar gummies for gut health. 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 - Continue elderberry and apple cider vinegar gummies - Encourage finding a full-time nurse for her grandmother  Follow-up - Follow up on January 02, 2024, at 2 PM  Vitals Temp: 98.8 F (37.1 C) BP: 102/70 Pulse Rate: 61 SpO2: 98 %   Anthropometric Measurements Height: 6\' 3"  (1.905 m) Weight: 261 lb (118.4 kg) BMI (Calculated): 32.62 Weight at Last  Visit: 263 Weight Lost Since Last Visit: 2 lb Weight Gained Since Last Visit: 0 Starting Weight: 249 lb Total Weight Loss (lbs): 2 lb (0.907 kg) Peak Weight: 257 lb   Body Composition  Body Fat %: 41.5 % Fat Mass (lbs): 108.4 lbs Muscle Mass (lbs): 145 lbs Total Body Water (lbs): 95.2 lbs Visceral Fat Rating : 9   Other Clinical Data RMR: 1958 Fasting: yes Labs: yes Today's Visit #: 20 Starting Date: 06/04/22     ASSESSMENT AND PLAN:  Diet: Shae is currently in the action stage of change. As such, her goal is to continue with weight loss efforts and has agreed to keeping a food journal and adhering to recommended goals of 1500-1700 calories and 100+ grams of protein.   Exercise:  All adults should avoid inactivity. Some activity is better than none, and adults who participate in any amount of physical activity, gain some health benefits. and For substantial health benefits, adults should do at least 150 minutes (2 hours and 30 minutes) a week of moderate-intensity, or 75 minutes (1 hour and 15 minutes) a week of vigorous-intensity aerobic physical activity, or an equivalent combination of moderate- and vigorous-intensity aerobic activity. Aerobic activity should be performed in episodes of at least 10 minutes, and preferably, it should be spread throughout the week.  Behavior Modification:  We discussed the following Behavioral Modification Strategies today: increasing lean protein intake, decreasing simple carbohydrates, increasing vegetables, increase H2O intake, increase high fiber foods, no skipping meals, emotional eating strategies , avoiding temptations, planning for success, and keep a strict food journal. We discussed various medication options to help Frimy with her weight loss efforts and we both agreed to continue medications as prescribed for medical weight loss and continue to work on nutritional and behavioral strategies to promote weight loss.  .  Return  in about 4 weeks (around 01/01/2024).Marland Kitchen She was informed of the importance of frequent follow up visits to maximize her success with intensive lifestyle modifications for her multiple health conditions.  Attestation Statements:   Reviewed by clinician on day of visit: allergies, medications, problem list, medical history, surgical history, family history, social history, and previous encounter notes.   Time spent on visit including pre-visit chart review and post-visit care and charting was 32 minutes  Donshay Lupinski,PA-C

## 2023-12-05 LAB — CBC WITH DIFFERENTIAL/PLATELET
Basophils Absolute: 0 10*3/uL (ref 0.0–0.2)
Basos: 1 %
EOS (ABSOLUTE): 0.1 10*3/uL (ref 0.0–0.4)
Eos: 1 %
Hematocrit: 40.6 % (ref 34.0–46.6)
Hemoglobin: 13.7 g/dL (ref 11.1–15.9)
Immature Grans (Abs): 0 10*3/uL (ref 0.0–0.1)
Immature Granulocytes: 0 %
Lymphocytes Absolute: 1.7 10*3/uL (ref 0.7–3.1)
Lymphs: 34 %
MCH: 31.1 pg (ref 26.6–33.0)
MCHC: 33.7 g/dL (ref 31.5–35.7)
MCV: 92 fL (ref 79–97)
Monocytes Absolute: 0.4 10*3/uL (ref 0.1–0.9)
Monocytes: 8 %
Neutrophils Absolute: 2.7 10*3/uL (ref 1.4–7.0)
Neutrophils: 56 %
Platelets: 279 10*3/uL (ref 150–450)
RBC: 4.4 x10E6/uL (ref 3.77–5.28)
RDW: 12.5 % (ref 11.7–15.4)
WBC: 4.9 10*3/uL (ref 3.4–10.8)

## 2023-12-05 LAB — CMP14+EGFR
ALT: 14 [IU]/L (ref 0–32)
AST: 19 [IU]/L (ref 0–40)
Albumin: 4.7 g/dL (ref 3.9–4.9)
Alkaline Phosphatase: 60 [IU]/L (ref 44–121)
BUN/Creatinine Ratio: 10 (ref 9–23)
BUN: 12 mg/dL (ref 6–20)
Bilirubin Total: 0.2 mg/dL (ref 0.0–1.2)
CO2: 25 mmol/L (ref 20–29)
Calcium: 9.7 mg/dL (ref 8.7–10.2)
Chloride: 100 mmol/L (ref 96–106)
Creatinine, Ser: 1.18 mg/dL — ABNORMAL HIGH (ref 0.57–1.00)
Globulin, Total: 2.5 g/dL (ref 1.5–4.5)
Glucose: 93 mg/dL (ref 70–99)
Potassium: 4.4 mmol/L (ref 3.5–5.2)
Sodium: 139 mmol/L (ref 134–144)
Total Protein: 7.2 g/dL (ref 6.0–8.5)
eGFR: 60 mL/min/{1.73_m2} (ref >59)

## 2023-12-05 LAB — HEMOGLOBIN A1C
Est. average glucose Bld gHb Est-mCnc: 120 mg/dL
Hgb A1c MFr Bld: 5.8 % — ABNORMAL HIGH (ref 4.8–5.6)

## 2023-12-05 LAB — LIPID PANEL WITH LDL/HDL RATIO
Cholesterol, Total: 201 mg/dL — ABNORMAL HIGH (ref 100–199)
HDL: 73 mg/dL (ref >39)
LDL Chol Calc (NIH): 111 mg/dL — ABNORMAL HIGH (ref 0–99)
LDL/HDL Ratio: 1.5 {ratio} (ref 0.0–3.2)
Triglycerides: 95 mg/dL (ref 0–149)
VLDL Cholesterol Cal: 17 mg/dL (ref 5–40)

## 2023-12-05 LAB — VITAMIN D 25 HYDROXY (VIT D DEFICIENCY, FRACTURES): Vit D, 25-Hydroxy: 38.2 ng/mL (ref 30.0–100.0)

## 2023-12-05 LAB — TSH: TSH: 2.43 u[IU]/mL (ref 0.450–4.500)

## 2023-12-05 LAB — INSULIN, RANDOM: INSULIN: 19 u[IU]/mL (ref 2.6–24.9)

## 2023-12-05 LAB — VITAMIN B12: Vitamin B-12: 492 pg/mL (ref 232–1245)

## 2023-12-18 ENCOUNTER — Other Ambulatory Visit (HOSPITAL_COMMUNITY): Payer: Self-pay

## 2024-01-01 NOTE — Progress Notes (Unsigned)
 SUBJECTIVE: Discussed the use of AI scribe software for clinical note transcription with the patient, who gave verbal consent to proceed.  Chief Complaint: Obesity  Interim History: She is down 3 lbs since last visit.   Renee Malone is here to discuss her progress with her obesity treatment plan. She is on the keeping a food journal and adhering to recommended goals of 1500-1600 calories and 90 protein and states she is following her eating plan approximately 70-80 % of the time. She states she is exercising walking for 30 minutes 5 times per week.  Renee Malone is a 38 year old female who presents for follow-up of her obesity treatment plan.  She has a history of prediabetes, hyperlipidemia, vitamin D deficiency, and emotional eating. She has lost three pounds since her last visit and has been diligent with her diet and exercise, increasing her average steps by almost a thousand per day.    She avoids sweets and has reduced her intake of eggs and sodas to manage her cholesterol levels. She has switched to drinking water with flavor packets and is trying to limit starches, although her husband enjoys pasta dishes.  She is currently on estrogen therapy, which was increased from four to six milligrams due to low estrogen levels. She feels bloated and has noticed an increase in breast size, which she attributes to the higher estrogen dose. She has been experimenting with reducing the dose to manage these symptoms and experiences palpitations and flutters when on the higher dose. We discussed earlier than planned follow up with Endocrinology may be needed to address her symptoms.   Her A1c has improved and is currently at 5.8%. She is not taking Topiramate consistently, but continues with Bupropion without issues. She has been focusing on improving her sleep and managing stress, which has been high due to work and financial concerns.  She has noticed a pea-sized lump on the side of her  head, which is pliable and non-painful. She experiences occasional headaches, which she suspects might be related to aspartame intake. She has a family history of lipomas and plans to have the lump evaluated by her primary care provider.  She is up for a promotion at work, which would involve less driving and more management responsibilities.   Upcoming 38th Birthday !   Pharmacotherapy: Wellbutrin for cravings/emotional eating     Topamax- intermittently taken- not that helpful    Metformin- Stopped due to GI intolerance OBJECTIVE: Visit Diagnoses: Problem List Items Addressed This Visit     Depression   Relevant Medications   buPROPion (WELLBUTRIN XL) 300 MG 24 hr tablet   Prediabetes   Vitamin D deficiency   Relevant Medications   Vitamin D, Ergocalciferol, (DRISDOL) 1.25 MG (50000 UNIT) CAPS capsule   Obesity-- Start BMI 31.12   Other hyperlipidemia - Primary   Other Visit Diagnoses       BMI 32.0-32.9,adult Current BMI 32.3         Obesity She has lost three pounds since the last visit but continues to experience gradual weight gain, potentially exacerbated by recent increase in estrogen dosage.  She is actively working on lifestyle modifications, including dietary changes and increased physical activity.  She is considering reducing her estrogen dosage due to side effects such as weight gain and palpitations and plans to discuss this with her endocrinologist. - Continue lifestyle modifications including dietary changes and increased physical activity - Discuss with endocrinology about reducing estrogen dosage to aid in weight management/decrease recent  symptoms Continue to exercise regularly.   Hyperlipidemia LDL and total cholesterol levels worsening. She is making dietary changes to reduce cholesterol intake, such as eliminating eggs and sodas, and focusing on lean proteins and vegetables. She aims to minimize saturated fats to five grams or less per day. Lab Results   Component Value Date   CHOL 201 (H) 12/04/2023   CHOL 177 07/01/2023   CHOL 202 (H) 02/28/2023   Lab Results  Component Value Date   HDL 73 12/04/2023   HDL 63 07/01/2023   HDL 75 02/28/2023   Lab Results  Component Value Date   LDLCALC 111 (H) 12/04/2023   LDLCALC 100 (H) 07/01/2023   LDLCALC 110 (H) 02/28/2023   Lab Results  Component Value Date   TRIG 95 12/04/2023   TRIG 76 07/01/2023   TRIG 98 02/28/2023   Lab Results  Component Value Date   CHOLHDL 2.7 12/31/2022   CHOLHDL 2.6 06/20/2022   CHOLHDL 2.9 06/04/2022   No results found for: "LDLDIRECT"  - Continue dietary modifications to reduce cholesterol intake - Recheck cholesterol levels in the next 2-3 months Continue to work on nutrition plan -decreasing simple carbohydrates, increasing lean proteins, decreasing saturated fats and cholesterol , avoiding trans fats and exercise as able to promote weight loss, improve lipids and decrease cardiovascular risks. - Discuss with endocrinology about estrogen's impact on cholesterol levels- Would expect improvement in lipids but may be negated by gradual continued weight gain and insulin resistance.   Prediabetes A1c has improved to 5.8%, indicating progress in managing prediabetes. She is actively working on dietary changes and increasing physical activity to further improve glucose levels. Insulin level slightly worse however. Lab Results  Component Value Date   HGBA1C 5.8 (H) 12/04/2023   HGBA1C 5.9 (H) 07/01/2023   HGBA1C 6.1 (H) 02/28/2023   Lab Results  Component Value Date   MICROALBUR 0.7 07/07/2020   LDLCALC 111 (H) 12/04/2023   CREATININE 1.18 (H) 12/04/2023   INSULIN  Date Value Ref Range Status  12/04/2023 19.0 2.6 - 24.9 uIU/mL Final  ] - Continue monitoring A1c levels - Maintain current lifestyle modifications to manage glucose levels Continue working on nutrition plan to decrease simple carbohydrates, increase lean proteins and exercise to  promote weight loss, improve glycemic control/decrease insulin resistance and prevent progression to Type 2 diabetes.   Emotional Eating She is managing emotional eating with lifestyle changes and bupropion, which she reports is effective. She is not taking topiramate but is focusing on improving sleep and managing stress. - Continue bupropion as prescribed - Focus on sleep hygiene and stress management - Monitor emotional eating patterns Meds ordered this encounter  Medications   buPROPion (WELLBUTRIN XL) 300 MG 24 hr tablet    Sig: Take 1 tablet (300 mg total) by mouth daily.    Dispense:  30 tablet    Refill:  0   Vitamin D, Ergocalciferol, (DRISDOL) 1.25 MG (50000 UNIT) CAPS capsule    Sig: Take 1 capsule (50,000 Units total) by mouth every 7 (seven) days.    Dispense:  4 capsule    Refill:  0     Vitamin D Deficiency Vitamin D levels have decreased slightly, prompting a return to weekly supplementation. Last vitamin D Lab Results  Component Value Date   VD25OH 38.2 12/04/2023   Low vitamin D levels can be associated with adiposity and may result in leptin resistance and weight gain. Also associated with fatigue.  Currently on vitamin D supplementation without  any adverse effects such as nausea, vomiting or muscle weakness.  - Resume weekly Ergocalciferol 50,000 units - Recheck vitamin D levels in a few months Meds ordered this encounter  Medications   buPROPion (WELLBUTRIN XL) 300 MG 24 hr tablet    Sig: Take 1 tablet (300 mg total) by mouth daily.    Dispense:  30 tablet    Refill:  0   Vitamin D, Ergocalciferol, (DRISDOL) 1.25 MG (50000 UNIT) CAPS capsule    Sig: Take 1 capsule (50,000 Units total) by mouth every 7 (seven) days.    Dispense:  4 capsule    Refill:  0    General Health Maintenance She is undergoing screenings for potential health concerns, including a lump on her head that may require further evaluation. She plans to consult with her primary care  provider for further assessment and potential referral to a specialist if needed. - Undergo screenings for potential health concerns, including evaluation of the lump on her head  Vitals Temp: 98.1 F (36.7 C) BP: 119/72 Pulse Rate: 62 SpO2: 99 %   Anthropometric Measurements Height: 6\' 3"  (1.905 m) Weight: 258 lb (117 kg) BMI (Calculated): 32.25 Weight at Last Visit: 261 lb Weight Lost Since Last Visit: 3 lb Weight Gained Since Last Visit: 0 Starting Weight: 249 lb Peak Weight: 263 lb   Body Composition  Body Fat %: 41.5 % Fat Mass (lbs): 107.4 lbs Muscle Mass (lbs): 143.6 lbs Total Body Water (lbs): 93 lbs Visceral Fat Rating : 9   Other Clinical Data Fasting: no Labs: no Today's Visit #: 21 Starting Date: 06/04/22     ASSESSMENT AND PLAN:  Diet: Kailena is currently in the action stage of change. As such, her goal is to continue with weight loss efforts. She has agreed to keeping a food journal and adhering to recommended goals of 1500--1600 calories and 90+ grams of  protein.  Exercise: Quierra has been instructed to work up to a goal of 150 minutes of combined cardio and strengthening exercise per week for weight loss and overall health benefits.   Behavior Modification:  We discussed the following Behavioral Modification Strategies today: increasing lean protein intake, decreasing simple carbohydrates, increasing vegetables, increase H2O intake, increase high fiber foods, meal planning and cooking strategies, emotional eating strategies , celebration eating strategies, avoiding temptations, and planning for success. We discussed various medication options to help Reagen with her weight loss efforts and we both agreed to continue bupropion for emotional eating and continue to work on nutritional and behavioral strategies to promote weight loss.  .  Return in about 4 weeks (around 01/30/2024).Marland Kitchen She was informed of the importance of frequent follow up visits  to maximize her success with intensive lifestyle modifications for her multiple health conditions.  Attestation Statements:   Reviewed by clinician on day of visit: allergies, medications, problem list, medical history, surgical history, family history, social history, and previous encounter notes.   Time spent on visit including pre-visit chart review and post-visit care and charting was 45 minutes.    Samarion Ehle, PA-C

## 2024-01-02 ENCOUNTER — Ambulatory Visit (INDEPENDENT_AMBULATORY_CARE_PROVIDER_SITE_OTHER): Payer: 59 | Admitting: Physician Assistant

## 2024-01-02 ENCOUNTER — Encounter (INDEPENDENT_AMBULATORY_CARE_PROVIDER_SITE_OTHER): Payer: Self-pay | Admitting: Physician Assistant

## 2024-01-02 VITALS — BP 119/72 | HR 62 | Temp 98.1°F | Ht 75.0 in | Wt 258.0 lb

## 2024-01-02 DIAGNOSIS — E559 Vitamin D deficiency, unspecified: Secondary | ICD-10-CM | POA: Diagnosis not present

## 2024-01-02 DIAGNOSIS — F3289 Other specified depressive episodes: Secondary | ICD-10-CM | POA: Diagnosis not present

## 2024-01-02 DIAGNOSIS — R7303 Prediabetes: Secondary | ICD-10-CM

## 2024-01-02 DIAGNOSIS — E7849 Other hyperlipidemia: Secondary | ICD-10-CM

## 2024-01-02 DIAGNOSIS — E669 Obesity, unspecified: Secondary | ICD-10-CM

## 2024-01-02 DIAGNOSIS — Z6832 Body mass index (BMI) 32.0-32.9, adult: Secondary | ICD-10-CM

## 2024-01-02 DIAGNOSIS — F5089 Other specified eating disorder: Secondary | ICD-10-CM

## 2024-01-02 MED ORDER — BUPROPION HCL ER (XL) 300 MG PO TB24: 300.0000 mg | ORAL_TABLET | Freq: Every day | ORAL | 0 refills | Status: DC

## 2024-01-02 MED ORDER — VITAMIN D (ERGOCALCIFEROL) 1.25 MG (50000 UNIT) PO CAPS: 50000.0000 [IU] | ORAL_CAPSULE | ORAL | 0 refills | Status: DC

## 2024-01-07 ENCOUNTER — Other Ambulatory Visit (INDEPENDENT_AMBULATORY_CARE_PROVIDER_SITE_OTHER): Payer: Self-pay | Admitting: Physician Assistant

## 2024-01-07 DIAGNOSIS — F3289 Other specified depressive episodes: Secondary | ICD-10-CM

## 2024-02-04 NOTE — Progress Notes (Unsigned)
 SUBJECTIVE: Discussed the use of AI scribe software for clinical note transcription with the patient, who gave verbal consent to proceed.  Chief Complaint: Obesity  Interim History: She is up 4 pounds since last visit.  Renee Malone is here to discuss her progress with her obesity treatment plan. She is on the keeping a food journal and adhering to recommended goals of 1500-1600 calories and 90 protein and states she is following her eating plan approximately 15 % of the time. She states she is exercising walking for 15 minutes 5 times per week.  Happy 38th Birthday !   Renee Malone is a 38 year old female with obesity and prediabetes who presents for follow-up of her obesity treatment plan.  She has experienced a weight gain of four pounds since her last visit, with her current weight at 262 pounds and a BMI of 32.8. Her body adipose percentage is 41.7%, with a goal of reducing it to 35% or less. Her weight fluctuates over time.  She has a history of prediabetes and struggles with cravings and emotional eating. Her busy work schedule, including multiple jobs and a Deal[employer], contributes to stress and overeating. She attempts to avoid fast food and drinks plenty of water, though she uses flavor packets. Increased water intake leads to increased urination.  She is currently taking bupropion  (Wellbutrin ) and finds it beneficial, stating she would 'probably crash' without it. She has been prescribed topiramate  (Topamax ) for cravings and sleep but does not take it regularly, as it primarily aids her sleep rather than curbing cravings. She has an adequate supply of topiramate  at home if needed.  She is mindful of her protein intake and cholesterol levels, often reading food labels. She has reduced her estrogen dose from six milligrams to four milligrams due to its effects on her body, including increased cravings.  In terms of physical activity, she tries to walk more and is  considering adding weights to her routine. She believes rotating her own tires has contributed to increased muscle mass. Financial stress from working multiple jobs to pay off her car loan impacts her ability to focus on her health.   Pharmacotherapy: Wellbutrin  for cravings/emotional eating                                       Topamax - intermittently taken- not that helpful                                     Metformin - Stopped due to GI intolerance  OBJECTIVE: Visit Diagnoses: Problem List Items Addressed This Visit     Depression   Relevant Medications   buPROPion  (WELLBUTRIN  XL) 300 MG 24 hr tablet   Prediabetes - Primary   Vitamin D  deficiency   Relevant Medications   Vitamin D , Ergocalciferol , (DRISDOL ) 1.25 MG (50000 UNIT) CAPS capsule   Obesity-- Start BMI 31.12   Other Visit Diagnoses       BMI 32.0-32.9,adult Current BMI 32.8         Obesity Renee Malone has gained four pounds since her last visit, with a current weight of 262 pounds and a BMI of 32.8. Her adipose tissue percentage is 41.7%, with a goal of 35% or less. She faces challenges with emotional eating and cravings due to a busy work schedule. Despite these challenges,  she has increased muscle mass and reduced soda intake. She is not regularly taking topiramate , which was previously prescribed for cravings and sleep. She reports topiramate  aids sleep but not cravings. - Encourage continued avoidance of soda and increased water intake to support kidney health. - Discuss resuming topiramate  for cravings and sleep, allowing her to decide based on her needs. - Encourage increased physical activity, including walking and using weights during daily activities. - Provide guidance on healthy eating habits, including reading food labels and managing fast food intake. - Encourage journaling to track food intake and exercise. - Discuss forming a walking group for motivation.  Prediabetes She is working on nutrition plan  to decrease simple carbohydrates, increase lean proteins and exercise to promote weight loss, improve glycemic control and prevent progression to Type 2 diabetes.  . She manages her condition by avoiding soda, drinking water, and monitoring protein and cholesterol intake. Her medication regimen includes bupropion , which she finds beneficial for mental health and overall well-being. Metformin  was started in the past, but stopped due to GI intolerance.  Lab Results  Component Value Date   HGBA1C 5.8 (H) 12/04/2023   HGBA1C 5.9 (H) 07/01/2023   HGBA1C 6.1 (H) 02/28/2023   Lab Results  Component Value Date   MICROALBUR 0.7 07/07/2020   LDLCALC 111 (H) 12/04/2023   CREATININE 1.18 (H) 12/04/2023   INSULIN   Date Value Ref Range Status  12/04/2023 19.0 2.6 - 24.9 uIU/mL Final  ] - Continue bupropion  as it is beneficial for mental health and well-being. - Encourage continued water intake to support kidney health. - Monitor dietary intake, focusing on protein and cholesterol levels. Continue working on nutrition plan to decrease simple carbohydrates, increase lean proteins and exercise to promote weight loss, improve glycemic control and prevent progression to Type 2 diabetes. '   Vitamin D  Deficiency Vitamin D  is not at goal of 50.  Most recent vitamin D  level was 38.2. She is on  prescription ergocalciferol  50,000 IU weekly. Lab Results  Component Value Date   VD25OH 38.2 12/04/2023   VD25OH 55.4 07/01/2023   VD25OH 52.5 02/28/2023    Plan: Continue and refill  prescription ergocalciferol  50,000 IU weekly Low vitamin D  levels can be associated with adiposity and may result in leptin resistance and weight gain. Also associated with fatigue.  Currently on vitamin D  supplementation without any adverse effects such as nausea, vomiting or muscle weakness.   Other depression/emotional eating Renee Malone has had issues with stress/emotional eating. Currently this is moderately controlled.  Overall mood is stable. Medication(s): Bupropion  XL 300 mg daily  Plan: Continue and refill Bupropion  XL 300 mg daily  Meds ordered this encounter  Medications   buPROPion  (WELLBUTRIN  XL) 300 MG 24 hr tablet    Sig: Take 1 tablet (300 mg total) by mouth daily.    Dispense:  30 tablet    Refill:  0   Vitamin D , Ergocalciferol , (DRISDOL ) 1.25 MG (50000 UNIT) CAPS capsule    Sig: Take 1 capsule (50,000 Units total) by mouth every 7 (seven) days.    Dispense:  4 capsule    Refill:  0    Vitals Temp: 98.2 F (36.8 C) BP: 119/77 Pulse Rate: 85 SpO2: 100 %   Anthropometric Measurements Height: 6\' 3"  (1.905 m) Weight: 262 lb (118.8 kg) BMI (Calculated): 32.75 Weight at Last Visit: 258 lb Weight Lost Since Last Visit: 0 Weight Gained Since Last Visit: 4 lb Starting Weight: 249 lb Total Weight Loss (lbs): 0 lb (  0 kg) Peak Weight: 263 lb   Body Composition  Body Fat %: 41.7 % Fat Mass (lbs): 109.6 lbs Muscle Mass (lbs): 145.6 lbs Total Body Water (lbs): 94 lbs Visceral Fat Rating : 9   Other Clinical Data Fasting: no Labs: no Today's Visit #: 22 Starting Date: 06/04/22     ASSESSMENT AND PLAN:  Diet: Renee Malone is currently in the action stage of change. As such, her goal is to continue with weight loss efforts. She has agreed to keeping a food journal and adhering to recommended goals of 1500-1600 calories and 90 grams of  protein.  Exercise: Renee Malone has been instructed to work up to a goal of 150 minutes of combined cardio and strengthening exercise per week for weight loss and overall health benefits.   Behavior Modification:  We discussed the following Behavioral Modification Strategies today: increasing lean protein intake, decreasing simple carbohydrates, increasing vegetables, increase H2O intake, increase high fiber foods, decreasing eating out, no skipping meals, emotional eating strategies , avoiding temptations, planning for success, and keep a strict  food journal. We discussed various medication options to help Renee Malone with her weight loss efforts and we both agreed to continue current treatment plan and continue to work on nutritional and behavioral strategies to promote weight loss.  .  Return in about 6 weeks (around 03/18/2024).Aaron Aas She was informed of the importance of frequent follow up visits to maximize her success with intensive lifestyle modifications for her multiple health conditions.  Attestation Statements:   Reviewed by clinician on day of visit: allergies, medications, problem list, medical history, surgical history, family history, social history, and previous encounter notes.   Time spent on visit including pre-visit chart review and post-visit care and charting was 29 minutes.    Zephaniah Lubrano, PA-C

## 2024-02-05 ENCOUNTER — Encounter (INDEPENDENT_AMBULATORY_CARE_PROVIDER_SITE_OTHER): Payer: Self-pay | Admitting: Physician Assistant

## 2024-02-05 ENCOUNTER — Ambulatory Visit (INDEPENDENT_AMBULATORY_CARE_PROVIDER_SITE_OTHER): Admitting: Physician Assistant

## 2024-02-05 VITALS — BP 119/77 | HR 85 | Temp 98.2°F | Ht 75.0 in | Wt 262.0 lb

## 2024-02-05 DIAGNOSIS — F5089 Other specified eating disorder: Secondary | ICD-10-CM | POA: Diagnosis not present

## 2024-02-05 DIAGNOSIS — R7303 Prediabetes: Secondary | ICD-10-CM | POA: Diagnosis not present

## 2024-02-05 DIAGNOSIS — F3289 Other specified depressive episodes: Secondary | ICD-10-CM | POA: Diagnosis not present

## 2024-02-05 DIAGNOSIS — E559 Vitamin D deficiency, unspecified: Secondary | ICD-10-CM

## 2024-02-05 DIAGNOSIS — Z6832 Body mass index (BMI) 32.0-32.9, adult: Secondary | ICD-10-CM

## 2024-02-05 DIAGNOSIS — E669 Obesity, unspecified: Secondary | ICD-10-CM

## 2024-02-05 DIAGNOSIS — E7849 Other hyperlipidemia: Secondary | ICD-10-CM

## 2024-02-05 MED ORDER — VITAMIN D (ERGOCALCIFEROL) 1.25 MG (50000 UNIT) PO CAPS: 50000.0000 [IU] | ORAL_CAPSULE | ORAL | 0 refills | Status: DC

## 2024-02-05 MED ORDER — BUPROPION HCL ER (XL) 300 MG PO TB24: 300.0000 mg | ORAL_TABLET | Freq: Every day | ORAL | 0 refills | Status: DC

## 2024-03-09 ENCOUNTER — Telehealth: Payer: Self-pay

## 2024-03-09 ENCOUNTER — Other Ambulatory Visit (INDEPENDENT_AMBULATORY_CARE_PROVIDER_SITE_OTHER): Payer: Self-pay | Admitting: Physician Assistant

## 2024-03-09 DIAGNOSIS — B20 Human immunodeficiency virus [HIV] disease: Secondary | ICD-10-CM

## 2024-03-09 DIAGNOSIS — F3289 Other specified depressive episodes: Secondary | ICD-10-CM

## 2024-03-09 MED ORDER — BIKTARVY 50-200-25 MG PO TABS: 1.0000 | ORAL_TABLET | Freq: Every day | ORAL | 1 refills | Status: DC

## 2024-03-09 NOTE — Telephone Encounter (Signed)
 Patient requesting medication refill after scheduling follow up with Dr. Ernie Heal. Will need Biktarvy  to be sent to [facility] on [address] instead of the [facility]. Best contact number is [phone]

## 2024-03-10 ENCOUNTER — Other Ambulatory Visit (INDEPENDENT_AMBULATORY_CARE_PROVIDER_SITE_OTHER): Payer: Self-pay

## 2024-03-10 ENCOUNTER — Telehealth (INDEPENDENT_AMBULATORY_CARE_PROVIDER_SITE_OTHER): Payer: Self-pay | Admitting: Physician Assistant

## 2024-03-10 DIAGNOSIS — E559 Vitamin D deficiency, unspecified: Secondary | ICD-10-CM

## 2024-03-10 DIAGNOSIS — F3289 Other specified depressive episodes: Secondary | ICD-10-CM

## 2024-03-10 MED ORDER — VITAMIN D (ERGOCALCIFEROL) 1.25 MG (50000 UNIT) PO CAPS: 50000.0000 [IU] | ORAL_CAPSULE | ORAL | 0 refills | Status: DC

## 2024-03-10 MED ORDER — BUPROPION HCL ER (XL) 300 MG PO TB24: 300.0000 mg | ORAL_TABLET | Freq: Every day | ORAL | 0 refills | Status: DC

## 2024-03-10 NOTE — Telephone Encounter (Signed)
 6/3 pt needs refill on Vit. D and  bupropion  , couldn't get 4wk appt with [facility]

## 2024-03-15 NOTE — Progress Notes (Unsigned)
 SUBJECTIVE: Discussed the use of AI scribe software for clinical note transcription with the patient, who gave verbal consent to proceed.  Chief Complaint: Obesity  Interim History: She is up 10 lbs since her last visit.    Renee Malone is here to discuss her progress with her obesity treatment plan. She is on the keeping a food journal and adhering to recommended goals of 1600 calories and 100 protein and states she is following her eating plan approximately 80 % of the time. She states she is exercising 30-40 minutes 3-5 times per week.  Renee Malone is a 38 year old female who presents for follow-up on her obesity treatment plan.  She has experienced a weight gain of ten pounds since her last visit, with an increase in both muscle and adipose mass. She adheres to her nutrition plan approximately 80% of the time and engages in walking for 30 to 40 minutes, three to five times per week. Despite these efforts, she is unsure about the cause of her weight gain and reports a high intake of water recently.  She consumes sweets, specifically mentioning Little Debbie snacks as a challenge. She is actively seeking healthier snack alternatives that satisfy her sweet cravings without excessive sugar intake. We explored options like fruits, Greek yogurt, and protein-based snacks to replace less healthy choices.  She is not currently taking topiramate  due to its side effects of drowsiness and grogginess. She is on bupropion  and vitamin D , with no reported issues, although she experienced increased comfort eating when she ran out of bupropion  temporarily.  She has made lifestyle changes, including eliminating fast food and eating at home, and is exploring options for non-perishable snacks to keep in her car. She is also considering a career change to reduce stress, which she believes has been impacting her health and weight management efforts.  In terms of social history, she is becoming more  active, enjoying walking, and going on hikes.   OBJECTIVE: Visit Diagnoses: Problem List Items Addressed This Visit     Depression   Relevant Medications   buPROPion  (WELLBUTRIN  XL) 300 MG 24 hr tablet   Prediabetes - Primary   Vitamin D  deficiency   Relevant Medications   Vitamin D , Ergocalciferol , (DRISDOL ) 1.25 MG (50000 UNIT) CAPS capsule   Obesity-- Start BMI 31.12   Other hyperlipidemia   Other Visit Diagnoses       BMI 34.0-34.9,adult current BMI of 34.0         Obesity Renee Malone has gained ten pounds since her last visit, with increased adipose tissue despite a slight increase in muscle mass. She adheres to her nutrition plan 80% of the time and walks 30-40 minutes, 3-5 times per week.  She reports consuming sweets, particularly Little Debbies, contributing to her weight gain. Increased water retention is also noted. We discussed trying a low carb nutrition plan to kick start her weight loss again. She is not interested in pursuing a low-carb diet currently. - Encourage consumption of fresh fruits as a healthier alternative to sweets. - Suggest [place] yogurt-based snacks, such as [place] yogurt cookie dough or Yasso [place] yogurt bars, as healthier dessert options. - Advise on portion control for nuts and other snacks to prevent overeating. - Recommend exploring protein bars with specific nutritional criteria (calories around 170-180, protein 15+ grams, sugars less than 5 grams).  Prediabetes Prediabetes control is a concern given her recent weight gain and dietary habits. Managing her weight and dietary intake is crucial to prevent  progression to diabetes. Consumption of high-sugar snacks like Little Debbies is particularly detrimental to her condition. Has not tolerated metformin  well due to GI upset. Concerns for taking while on biktarvy  as well.   Lab Results  Component Value Date   HGBA1C 5.8 (H) 12/04/2023   HGBA1C 5.9 (H) 07/01/2023   HGBA1C 6.1 (H) 02/28/2023    Lab Results  Component Value Date   MICROALBUR 0.7 07/07/2020   LDLCALC 111 (H) 12/04/2023   CREATININE 1.18 (H) 12/04/2023   INSULIN   Date Value Ref Range Status  12/04/2023 19.0 2.6 - 24.9 uIU/mL Final  ]Continue working on nutrition plan to decrease simple carbohydrates, increase lean proteins and exercise to promote weight loss, improve glycemic control and prevent progression to Type 2 diabetes.  - Reinforce the importance of dietary modifications to manage prediabetes, including reducing sugar intake. - Encourage regular physical activity to aid in weight management and improve insulin  sensitivity. Plan to recheck labs over the next 2-3 months.    Vitamin D  Deficiency Vitamin D  is not at goal of 50.  Most recent vitamin D  level was 38.2. She is on  prescription ergocalciferol  50,000 IU weekly. No N/V or muscle weakness with Ergocalciferol .  Lab Results  Component Value Date   VD25OH 38.2 12/04/2023   VD25OH 55.4 07/01/2023   VD25OH 52.5 02/28/2023    Plan: Continue and refill  prescription ergocalciferol  50,000 IU weekly Low vitamin D  levels can be associated with adiposity and may result in leptin resistance and weight gain. Also associated with fatigue.  Currently on vitamin D  supplementation without any adverse effects such as nausea, vomiting or muscle weakness.  Recheck vitamin D  levels in 2-3 months.    Hyperlipidemia LDL is not at goal. Medication(s): Not on Statin therapy.  Cardiovascular risk factors: dyslipidemia, obesity (BMI >= 30 kg/m2), and smoking/ tobacco exposure  Lab Results  Component Value Date   CHOL 201 (H) 12/04/2023   HDL 73 12/04/2023   LDLCALC 111 (H) 12/04/2023   TRIG 95 12/04/2023   CHOLHDL 2.7 12/31/2022   CHOLHDL 2.6 06/20/2022   CHOLHDL 2.9 06/04/2022   Lab Results  Component Value Date   ALT 14 12/04/2023   AST 19 12/04/2023   ALKPHOS 60 12/04/2023   BILITOT 0.2 12/04/2023   The ASCVD Risk score (Arnett DK, et al., 2019)  failed to calculate for the following reasons:   Unable to determine if patient is Non-Hispanic African American  Plan: Continue to work on Engineer, technical sales -decreasing simple carbohydrates, increasing lean proteins, decreasing saturated fats and cholesterol , avoiding trans fats and exercise as able to promote weight loss, improve lipids and decrease cardiovascular risks. Recheck fasting lipids in 2-3 months.     Eating disorder/emotional eating Renee Malone has had issues with stress/emotional eating. Currently this is poorly controlled. Overall mood is stable. Medication(s): Bupropion  XL 300 mg daily  A lapse in bupropion  due to a lack of refills led to increased comfort eating. She has Topamax  available but experiences drowsiness and grogginess with its use, so she is not currently taking it. Plan: Continue and refill Bupropion  XL 300 mg daily - Provide refills for bupropion  to prevent future lapses. - Discuss the side effects of Topamax  and consider its use only if necessary. Hopefully stressors will decrease if only working 2 positions and has more time to focus on health, exercise goals.  Meds ordered this encounter  Medications   DISCONTD: buPROPion  (WELLBUTRIN  XL) 300 MG 24 hr tablet    Sig: Take  1 tablet (300 mg total) by mouth daily.    Dispense:  30 tablet    Refill:  0   Vitamin D , Ergocalciferol , (DRISDOL ) 1.25 MG (50000 UNIT) CAPS capsule    Sig: Take 1 capsule (50,000 Units total) by mouth every 7 (seven) days.    Dispense:  4 capsule    Refill:  0   buPROPion  (WELLBUTRIN  XL) 300 MG 24 hr tablet    Sig: Take 1 tablet (300 mg total) by mouth daily.    Dispense:  30 tablet    Refill:  2    General Health Maintenance She is making lifestyle changes to improve her overall health, including reducing stress by considering a job change and increasing physical activity. She is also focusing on healthier eating habits by avoiding fast food and planning meals at home. - Encourage  continued physical activity, including walking and hiking. 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 - Advise on meal planning and budgeting to maintain healthy eating habits.  Follow-up Her next follow-up appointment is scheduled to monitor her progress and adjust her treatment plan as needed. - Schedule follow-up appointment for August 6th at 10 AM.  Vitals Temp: 98.3 F (36.8 C) BP: 110/74 Pulse Rate: 63 SpO2: 94 %   Anthropometric Measurements Height: 6\' 3"  (1.905 m) Weight: 272 lb (123.4 kg) BMI (Calculated): 34 Weight at Last Visit: 262lb Weight Lost Since Last Visit: 0 Weight Gained Since Last Visit: 10lb Starting Weight: 249 lb Total Weight Loss (lbs): 0 lb (0 kg) Peak Weight: 263lb   Body Composition  Body Fat %: 43.5 % Fat Mass (lbs): 118.4 lbs Muscle Mass (lbs): 146 lbs Total Body Water (lbs): 99.6 lbs Visceral Fat Rating : 10   Other Clinical Data Fasting: no Labs: no Today's Visit #: 23 Starting Date: 06/04/22     ASSESSMENT AND PLAN:  Diet: Renee Malone is currently in the action stage of change. As such, her goal is to continue with weight loss efforts. She has agreed to keeping a food journal and adhering to recommended goals of 1600 calories and 100+ grams of protein and use [email or web address] [email or web address] for recipe ideas to help stay on track.  Exercise: Renee Malone has been instructed to work up to a goal of 150 minutes of combined cardio and strengthening exercise per week and to continue exercising as is for weight loss and overall health benefits.   Behavior Modification:  We discussed the following Behavioral Modification Strategies today: increasing lean protein intake, decreasing simple carbohydrates, increasing vegetables, increase H2O intake, increase high fiber foods, decreasing eating out, no skipping meals, meal planning and cooking strategies, keeping healthy foods in  the home, better snacking choices, emotional eating strategies , avoiding temptations, and planning for success. We discussed various medication options to help Renee Malone with her weight loss efforts and we both agreed to continue current treatment plan and continue to work on nutritional and behavioral strategies to promote weight loss.   .  Return in about 8 weeks (around 05/11/2024).Aaron Aas She was informed of the importance of frequent follow up visits to maximize her success with intensive lifestyle modifications for her multiple health conditions.  Attestation Statements:   Reviewed by clinician on day of visit: allergies, medications, problem list, medical history, surgical history, family history, social history, and previous encounter notes.   Time spent on visit including pre-visit chart review and post-visit care and charting was 39 minutes.  Daivd Fredericksen, PA-C

## 2024-03-16 ENCOUNTER — Encounter (INDEPENDENT_AMBULATORY_CARE_PROVIDER_SITE_OTHER): Payer: Self-pay | Admitting: Physician Assistant

## 2024-03-16 ENCOUNTER — Ambulatory Visit (INDEPENDENT_AMBULATORY_CARE_PROVIDER_SITE_OTHER): Admitting: Physician Assistant

## 2024-03-16 VITALS — BP 110/74 | HR 63 | Temp 98.3°F | Ht 75.0 in | Wt 272.0 lb

## 2024-03-16 DIAGNOSIS — F32A Depression, unspecified: Secondary | ICD-10-CM

## 2024-03-16 DIAGNOSIS — R7303 Prediabetes: Secondary | ICD-10-CM

## 2024-03-16 DIAGNOSIS — F3289 Other specified depressive episodes: Secondary | ICD-10-CM

## 2024-03-16 DIAGNOSIS — F5089 Other specified eating disorder: Secondary | ICD-10-CM

## 2024-03-16 DIAGNOSIS — E669 Obesity, unspecified: Secondary | ICD-10-CM

## 2024-03-16 DIAGNOSIS — E7849 Other hyperlipidemia: Secondary | ICD-10-CM

## 2024-03-16 DIAGNOSIS — Z6834 Body mass index (BMI) 34.0-34.9, adult: Secondary | ICD-10-CM

## 2024-03-16 DIAGNOSIS — E559 Vitamin D deficiency, unspecified: Secondary | ICD-10-CM

## 2024-03-16 MED ORDER — BUPROPION HCL ER (XL) 300 MG PO TB24: 300.0000 mg | ORAL_TABLET | Freq: Every day | ORAL | 0 refills | Status: DC

## 2024-03-16 MED ORDER — BUPROPION HCL ER (XL) 300 MG PO TB24: 300.0000 mg | ORAL_TABLET | Freq: Every day | ORAL | 2 refills | Status: AC

## 2024-03-16 MED ORDER — VITAMIN D (ERGOCALCIFEROL) 1.25 MG (50000 UNIT) PO CAPS
50000.0000 [IU] | ORAL_CAPSULE | ORAL | 0 refills | Status: AC
Start: 2024-03-16 — End: ?

## 2024-04-27 ENCOUNTER — Ambulatory Visit

## 2024-04-27 ENCOUNTER — Other Ambulatory Visit: Payer: Self-pay

## 2024-05-04 ENCOUNTER — Other Ambulatory Visit: Payer: Self-pay | Admitting: Infectious Disease

## 2024-05-04 DIAGNOSIS — B20 Human immunodeficiency virus [HIV] disease: Secondary | ICD-10-CM

## 2024-05-06 ENCOUNTER — Encounter: Payer: Self-pay | Admitting: Infectious Disease

## 2024-05-06 DIAGNOSIS — Z113 Encounter for screening for infections with a predominantly sexual mode of transmission: Secondary | ICD-10-CM | POA: Insufficient documentation

## 2024-05-06 NOTE — Progress Notes (Unsigned)
 Name: Renee Malone  MRN/ DOB: [id number], 1986-05-09    Age/ Sex: 38 y.o., adult     PCP: Cristopher Suzen HERO, NP   Reason for Endocrinology Evaluation: Gender dysphoria     Initial Endocrinology Clinic Visit: 10/27/2020    PATIENT IDENTIFIER: Renee Malone is a 37 y.o., adult with a past medical history of HIV, gender dysphoria. She has followed with [facility] since 10/27/2020 for consultative assistance with management of her gender dysphoria.   HISTORICAL SUMMARY: The patient has been on gender affirming therapy since 2010.  She is status post breast reconstruction surgery in 2019.   She is status post vaginoplasty 07/2020 with revision of vaginal introitus and labia 05/2021   Switched Cl[place]ara  to oral estradiol  due to rash 01/2022   Great grandmother and great aunt with breast Ca   SUBJECTIVE:    Today (05/07/2024):  Renee Malone is here for for follow-up on gender dysphoria.  Patient is a transgender female.  Patient continues to follow-up with [facility] Patient follows with infectious disease for continued HIV management  Weight continues to trend up Pt couldn't tolerate 3 tabs of estrogen due to breast pain and palpitations  Has increased  breast size  Denies diarrhea or constipation  Possible LE welling  No SOB unless with exertion Despite having facial laser hair removal, has noted regrowth of few rare thick and dark hairs    MEDICATIONS: Estradiol  2 gm, 3 tabs daily - takes 2 tabs daily     HISTORY:  Past Medical History:  Past Medical History:  Diagnosis Date   Anxiety    panic attacks   Elevated serum creatinine 06/10/2019   Heart murmur    HIV infection (HCC)    Lactose intolerance    Low hemoglobin 05/25/2020   Multiple food allergies    Prediabetes 05/25/2020   Routine screening for STI (sexually transmitted infection) 05/06/2024   SOBOE (shortness of breath on exertion)     Weight gain 02/12/2022   Past Surgical History:  Past Surgical History:  Procedure Laterality Date   BREAST ENHANCEMENT SURGERY Bilateral 02/25/2018   Procedure: MAMMOPLASTY AUGMENTATION WITH SILICONE IMPLANTS;  Surgeon: Arelia Filippo, MD;  Location: [facility];  Service: Plastics;  Laterality: Bilateral;   BREAST LUMPECTOMY Right 02/25/2018   Procedure: EXCISION RIGHT BREAST MASS;  Surgeon: Arelia Filippo, MD;  Location: [facility];  Service: Plastics;  Laterality: Right;   SKIN GRAFT     Social History:  reports that she quit smoking about 14 years ago. Her smoking use included cigarettes. She has never used smokeless tobacco. She reports current alcohol use of about 1.0 standard drink of alcohol per week. She reports that she does not use drugs. Family History:  Family History  Problem Relation Age of Onset   Hypertension Mother    Depression Mother    Anxiety disorder Mother    Bipolar disorder Mother    Schizophrenia Mother    Sleep apnea Mother    Drug abuse Mother    Obesity Mother    Hypertension Maternal Grandmother      HOME MEDICATIONS: Allergies as of 05/07/2024       Reactions   Broccoli [brassica Oleracea] Anaphylaxis   Other Anaphylaxis   Cauliflower        Medication List        Accurate as of May 07, 2024 11:15 AM. If you have any questions, ask your nurse  or doctor.          ALPRAZolam  1 MG tablet Commonly known as: XANAX  TAKE ONE TABLET BY MOUTH EVERY 8 HOURS AS NEEDED   atorvastatin  20 MG tablet Commonly known as: Lipitor Take 1 tablet (20 mg total) by mouth daily. Started by: Jomarie [place]   Biktarvy  50-200-25 MG Tabs tablet Generic drug: bictegravir-emtricitabine -tenofovir  AF Take 1 tablet by mouth daily.   buPROPion  300 MG 24 hr tablet Commonly known as: Wellbutrin  XL Take 1 tablet (300 mg total) by mouth daily.   estradiol  2 MG tablet Commonly known as: ESTRACE  Take 3 tablets (6 mg total)  by mouth daily. What changed: how much to take   hydrocortisone  1 % ointment Apply 1 application topically 2 (two) times daily.   topiramate  25 MG tablet Commonly known as: Topamax  Take 1 tablet (25 mg total) by mouth at bedtime.   Vitamin D  (Ergocalciferol ) 1.25 MG (50000 UNIT) Caps capsule Commonly known as: DRISDOL  Take 1 capsule (50,000 Units total) by mouth every 7 (seven) days.          OBJECTIVE:   PHYSICAL EXAM: VS: BP 110/68 (BP Location: Left Arm, Patient Position: Sitting, Cuff Size: Normal)   Pulse 75   Ht 6' 3 (1.905 m)   Wt 282 lb (127.9 kg)   SpO2 97%   BMI 35.25 kg/m    EXAM: General: Pt appears well and is in NAD  Neck: General: Supple without adenopathy. Thyroid : Thyroid  size normal.  No goiter or nodules appreciated.  Lungs: Clear with good BS bilat   Heart: Auscultation: RRR.  Abdomen: Normoactive bowel sounds, soft, nontender  Extremities:  BL LE: No pretibial edema  Mental Status: Judgment, insight: Intact Orientation: Oriented to time, place, and person Mood and affect: No depression, anxiety, or agitation     DATA REVIEWED:   Latest Reference Range & Units 05/07/24 11:47  Estradiol  pg/mL 73      Latest Reference Range & Units 12/04/23 08:56  Sodium 134 - 144 mmol/L 139  Potassium 3.5 - 5.2 mmol/L 4.4  Chloride 96 - 106 mmol/L 100  CO2 20 - 29 mmol/L 25  Glucose 70 - 99 mg/dL 93  BUN 6 - 20 mg/dL 12  Creatinine 9.42 - 8.99 mg/dL 8.81 (H)  Calcium  8.7 - 10.2 mg/dL 9.7  BUN/Creatinine Ratio 9 - 23  10  eGFR >59 mL/min/1.73 60  Alkaline Phosphatase 44 - 121 IU/L 60  Albumin 3.9 - 4.9 g/dL 4.7  AST 0 - 40 IU/L 19  ALT 0 - 32 IU/L 14  Total Protein 6.0 - 8.5 g/dL 7.2  Total Bilirubin 0.0 - 1.2 mg/dL 0.2    Latest Reference Range & Units 12/04/23 08:56  WBC 3.4 - 10.8 x10E3/uL 4.9  RBC 3.77 - 5.28 x10E6/uL 4.40  Hemoglobin 11.1 - 15.9 g/dL 86.2  HCT 65.9 - 53.3 % 40.6  MCV 79 - 97 fL 92  MCH 26.6 - 33.0 pg 31.1  MCHC  31.5 - 35.7 g/dL 66.2  RDW 88.2 - 84.5 % 12.5  Platelets 150 - 450 x10E3/uL 279     ASSESSMENT / PLAN / RECOMMENDATIONS:   Gender Dysphoria :  - Female to female  -Patient allergic to estrogen patches -She is s/p vaginoplasty 07/2020 with revision of vaginal introitus and labia 05/2021 - Intolerant to 3 mg of estradiol  due to palpitation and breast pain -Since she is happy with clinical improvement, no need to change estradiol  at this time   Medications  Estradiol  2 mg, 2 tabs  daily    Follow-up in 1 yr       Signed electronically by: Stefano Redgie Butts, MD  [facility] Endocrinology  [facility] [address] [place], [place] [address] Phone: [phone] FAX: [phone]      CC: Cristopher Suzen HERO, NP [address] [place] [address] Phone: [phone]  Fax: [phone]   Return to Endocrinology clinic as below: Future Appointments  Date Time Provider Department Center  05/07/2024 11:30 AM Jaking Thayer, Donell Redgie, MD [facility] None  05/12/2024 10:00 AM Rayburn, Elouise Phlegm, PA-C [facility] None  06/09/2024  2:15 PM [facility] NURSE [facility]  11/03/2024 10:30 AM Fleeta Rothman, Jomarie SAILOR, MD [facility]

## 2024-05-06 NOTE — Progress Notes (Unsigned)
 Subjective:  Chief complaint: follow-up for HIV disease on medications   Patient ID: Renee Malone, adult    DOB: 04-Jul-1986, 38 y.o.   MRN: [id number]  HPI  Past Medical History:  Diagnosis Date   Anxiety    panic attacks   Elevated serum creatinine 06/10/2019   Heart murmur    HIV infection (HCC)    Lactose intolerance    Low hemoglobin 05/25/2020   Multiple food allergies    Prediabetes 05/25/2020   SOBOE (shortness of breath on exertion)    Weight gain 02/12/2022    Past Surgical History:  Procedure Laterality Date   BREAST ENHANCEMENT SURGERY Bilateral 02/25/2018   Procedure: MAMMOPLASTY AUGMENTATION WITH SILICONE IMPLANTS;  Surgeon: Arelia Filippo, MD;  Location: [facility];  Service: Plastics;  Laterality: Bilateral;   BREAST LUMPECTOMY Right 02/25/2018   Procedure: EXCISION RIGHT BREAST MASS;  Surgeon: Arelia Filippo, MD;  Location: [facility];  Service: Plastics;  Laterality: Right;   SKIN GRAFT      Family History  Problem Relation Age of Onset   Hypertension Mother    Depression Mother    Anxiety disorder Mother    Bipolar disorder Mother    Schizophrenia Mother    Sleep apnea Mother    Drug abuse Mother    Obesity Mother    Hypertension Maternal Grandmother       Social History   Socioeconomic History   Marital status: Married    Spouse name: Not on file   Number of children: Not on file   Years of education: Not on file   Highest education level: Not on file  Occupational History   Occupation: CNA   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    Social Drivers of Corporate investment banker Strain: Not on file  Food Insecurity: Not on file  Transportation Needs: Not on file  Physical Activity: Not on file  Stress: Not on file  Social Connections: Not on file    Allergies  Allergen Reactions   Broccoli [Brassica Oleracea] Anaphylaxis   Other Anaphylaxis    Cauliflower     Current Outpatient Medications:    BIKTARVY  50-200-25 MG TABS tablet, TAKE 1 TABLET BY MOUTH DAILY, Disp: 30 tablet, Rfl: 0   buPROPion  (WELLBUTRIN  XL) 300 MG 24 hr tablet, Take 1 tablet (300 mg total) by mouth daily., Disp: 30 tablet, Rfl: 2   estradiol  (ESTRACE ) 2 MG tablet, Take 3 tablets (6 mg total) by mouth daily., Disp: 270 tablet, Rfl: 2   hydrocortisone  1 % ointment, Apply 1 application topically 2 (two) times daily., Disp: 30 g, Rfl: 2   topiramate  (TOPAMAX ) 25 MG tablet, Take 1 tablet (25 mg total) by mouth at bedtime., Disp: 30 tablet, Rfl: 0   Vitamin D , Ergocalciferol , (  DRISDOL ) 1.25 MG (50000 UNIT) CAPS capsule, Take 1 capsule (50,000 Units total) by mouth every 7 (seven) days., Disp: 4 capsule, Rfl: 0    Review of Systems     Objective:   Physical Exam        Assessment & Plan:

## 2024-05-07 ENCOUNTER — Other Ambulatory Visit (HOSPITAL_COMMUNITY)
Admission: RE | Admit: 2024-05-07 | Discharge: 2024-05-07 | Disposition: A | Discharge status: Home / Self Care | Source: Ambulatory Visit | Attending: Infectious Disease | Admitting: Infectious Disease

## 2024-05-07 ENCOUNTER — Encounter: Payer: Self-pay | Admitting: Infectious Disease

## 2024-05-07 ENCOUNTER — Ambulatory Visit (INDEPENDENT_AMBULATORY_CARE_PROVIDER_SITE_OTHER): Payer: 59 | Admitting: Internal Medicine

## 2024-05-07 ENCOUNTER — Encounter: Payer: Self-pay | Admitting: Internal Medicine

## 2024-05-07 ENCOUNTER — Ambulatory Visit (INDEPENDENT_AMBULATORY_CARE_PROVIDER_SITE_OTHER): Admitting: Infectious Disease

## 2024-05-07 ENCOUNTER — Other Ambulatory Visit: Payer: Self-pay

## 2024-05-07 VITALS — BP 110/68 | HR 75 | Ht 75.0 in | Wt 282.0 lb

## 2024-05-07 VITALS — BP 111/76 | HR 70 | Ht 75.0 in | Wt 283.0 lb

## 2024-05-07 DIAGNOSIS — E669 Obesity, unspecified: Secondary | ICD-10-CM | POA: Diagnosis present

## 2024-05-07 DIAGNOSIS — Z1212 Encounter for screening for malignant neoplasm of rectum: Secondary | ICD-10-CM | POA: Insufficient documentation

## 2024-05-07 DIAGNOSIS — F640 Transsexualism: Secondary | ICD-10-CM | POA: Diagnosis not present

## 2024-05-07 DIAGNOSIS — Z113 Encounter for screening for infections with a predominantly sexual mode of transmission: Secondary | ICD-10-CM

## 2024-05-07 DIAGNOSIS — Z789 Other specified health status: Secondary | ICD-10-CM | POA: Insufficient documentation

## 2024-05-07 DIAGNOSIS — R635 Abnormal weight gain: Secondary | ICD-10-CM | POA: Diagnosis not present

## 2024-05-07 DIAGNOSIS — Z23 Encounter for immunization: Secondary | ICD-10-CM | POA: Diagnosis not present

## 2024-05-07 DIAGNOSIS — E7849 Other hyperlipidemia: Secondary | ICD-10-CM | POA: Insufficient documentation

## 2024-05-07 DIAGNOSIS — B20 Human immunodeficiency virus [HIV] disease: Secondary | ICD-10-CM | POA: Insufficient documentation

## 2024-05-07 LAB — CYTOLOGY, (ORAL, ANAL, URETHRAL) ANCILLARY ONLY
Chlamydia: NEGATIVE
Chlamydia: NEGATIVE
Comment: NEGATIVE
Comment: NEGATIVE
Comment: NORMAL
Comment: NORMAL
Neisseria Gonorrhea: NEGATIVE
Neisseria Gonorrhea: NEGATIVE

## 2024-05-07 LAB — URINE CYTOLOGY ANCILLARY ONLY
Chlamydia: NEGATIVE
Comment: NEGATIVE
Comment: NORMAL
Neisseria Gonorrhea: NEGATIVE

## 2024-05-07 MED ORDER — ESTRADIOL 2 MG PO TABS: 4.0000 mg | ORAL_TABLET | Freq: Every day | ORAL | 3 refills | Status: AC

## 2024-05-07 MED ORDER — ATORVASTATIN CALCIUM 20 MG PO TABS: 20.0000 mg | ORAL_TABLET | Freq: Every day | ORAL | 11 refills | Status: DC

## 2024-05-07 MED ORDER — BIKTARVY 50-200-25 MG PO TABS: 1.0000 | ORAL_TABLET | Freq: Every day | ORAL | 0 refills | Status: DC

## 2024-05-07 NOTE — Addendum Note (Signed)
 Addended by: CELESTIA LELA HERO on: 05/07/2024 11:18 AM   Modules accepted: Orders

## 2024-05-08 ENCOUNTER — Ambulatory Visit: Payer: Self-pay | Admitting: Internal Medicine

## 2024-05-08 LAB — T-HELPER CELLS (CD4) COUNT (NOT AT ARMC)
CD4 % Helper T Cell: 52 % (ref 33–65)
CD4 T Cell Abs: 1033 /uL (ref 400–1790)

## 2024-05-09 LAB — CBC WITH DIFFERENTIAL/PLATELET
Absolute Lymphocytes: 2205 {cells}/uL (ref 850–3900)
Absolute Monocytes: 504 {cells}/uL (ref 200–950)
Basophils Absolute: 38 {cells}/uL (ref 0–200)
Basophils Relative: 0.6 %
Eosinophils Absolute: 82 {cells}/uL (ref 15–500)
Eosinophils Relative: 1.3 %
HCT: 39.5 % (ref 35.0–45.0)
Hemoglobin: 13 g/dL (ref 11.7–15.5)
MCH: 31 pg (ref 27.0–33.0)
MCHC: 32.9 g/dL (ref 32.0–36.0)
MCV: 94 fL (ref 80.0–100.0)
MPV: 10.6 fL (ref 7.5–12.5)
Monocytes Relative: 8 %
Neutro Abs: 3471 {cells}/uL (ref 1500–7800)
Neutrophils Relative %: 55.1 %
Platelets: 230 Thousand/uL (ref 140–400)
RBC: 4.2 Million/uL (ref 3.80–5.10)
RDW: 12.4 % (ref 11.0–15.0)
Total Lymphocyte: 35 %
WBC: 6.3 Thousand/uL (ref 3.8–10.8)

## 2024-05-09 LAB — COMPLETE METABOLIC PANEL WITHOUT GFR
AG Ratio: 1.7 (calc) (ref 1.0–2.5)
ALT: 10 U/L (ref 6–29)
AST: 16 U/L (ref 10–30)
Albumin: 4.3 g/dL (ref 3.6–5.1)
Alkaline phosphatase (APISO): 49 U/L (ref 31–125)
BUN/Creatinine Ratio: 12 (calc) (ref 6–22)
BUN: 14 mg/dL (ref 7–25)
CO2: 30 mmol/L (ref 20–32)
Calcium: 9.2 mg/dL (ref 8.6–10.2)
Chloride: 104 mmol/L (ref 98–110)
Creat: 1.2 mg/dL — ABNORMAL HIGH (ref 0.50–0.99)
Globulin: 2.6 g/dL (ref 1.9–3.7)
Glucose, Bld: 89 mg/dL (ref 65–99)
Potassium: 4 mmol/L (ref 3.5–5.3)
Sodium: 140 mmol/L (ref 135–146)
Total Bilirubin: 0.2 mg/dL (ref 0.2–1.2)
Total Protein: 6.9 g/dL (ref 6.1–8.1)

## 2024-05-09 LAB — RPR: RPR Ser Ql: NONREACTIVE

## 2024-05-09 LAB — HIV-1 RNA QUANT-NO REFLEX-BLD
HIV 1 RNA Quant: <20 {copies}/mL — AB
HIV-1 RNA Quant, Log: <1.3 {Log_copies}/mL — AB

## 2024-05-11 LAB — TESTOSTERONE, TOTAL, LC/MS/MS: Testosterone, Total, LC-MS-MS: 9 ng/dL (ref 2–45)

## 2024-05-11 LAB — ESTRADIOL: Estradiol: 73 pg/mL

## 2024-05-11 NOTE — Progress Notes (Unsigned)
 SUBJECTIVE: Discussed the use of AI scribe software for clinical note transcription with the patient, who gave verbal consent to proceed.  Chief Complaint: Obesity  Interim History: She is up 10 lbs since her last visit.    Doretha is here to discuss her progress with her obesity treatment plan. She is on the keeping a food journal and adhering to recommended goals of 1600 calories and 100 protein and states she is following her eating plan approximately 80 % of the time. She states she is exercising 30-40 minutes 3-5 times per week.  Charmin Raveena Chismar is a 38 year old female who presents for follow-up on her obesity treatment plan.  She has experienced a weight gain of ten pounds since her last visit, with an increase in both muscle and adipose mass. She adheres to her nutrition plan approximately 80% of the time and engages in walking for 30 to 40 minutes, three to five times per week. Despite these efforts, she is unsure about the cause of her weight gain and reports a high intake of water recently.  She consumes sweets, specifically mentioning Little Debbie snacks as a challenge. She is actively seeking healthier snack alternatives that satisfy her sweet cravings without excessive sugar intake. We explored options like fruits, Greek yogurt, and protein-based snacks to replace less healthy choices.  She is not currently taking topiramate  due to its side effects of drowsiness and grogginess. She is on bupropion  and vitamin D , with no reported issues, although she experienced increased comfort eating when she ran out of bupropion  temporarily.  She has made lifestyle changes, including eliminating fast food and eating at home, and is exploring options for non-perishable snacks to keep in her car. She is also considering a career change to reduce stress, which she believes has been impacting her health and weight management efforts.  In terms of social history, she is becoming more  active, enjoying walking, and going on hikes.   OBJECTIVE: Visit Diagnoses: Problem List Items Addressed This Visit     Depression   Relevant Medications   buPROPion  (WELLBUTRIN  XL) 300 MG 24 hr tablet   Prediabetes - Primary   Vitamin D  deficiency   Relevant Medications   Vitamin D , Ergocalciferol , (DRISDOL ) 1.25 MG (50000 UNIT) CAPS capsule   Obesity-- Start BMI 31.12   Other hyperlipidemia   Other Visit Diagnoses       BMI 34.0-34.9,adult current BMI of 34.0         Obesity Nance Mccombs has gained ten pounds since her last visit, with increased adipose tissue despite a slight increase in muscle mass. She adheres to her nutrition plan 80% of the time and walks 30-40 minutes, 3-5 times per week.  She reports consuming sweets, particularly Little Debbies, contributing to her weight gain. Increased water retention is also noted. We discussed trying a low carb nutrition plan to kick start her weight loss again. She is not interested in pursuing a low-carb diet currently. - Encourage consumption of fresh fruits as a healthier alternative to sweets. - Suggest [place] yogurt-based snacks, such as [place] yogurt cookie dough or Yasso [place] yogurt bars, as healthier dessert options. - Advise on portion control for nuts and other snacks to prevent overeating. - Recommend exploring protein bars with specific nutritional criteria (calories around 170-180, protein 15+ grams, sugars less than 5 grams).  Prediabetes Prediabetes control is a concern given her recent weight gain and dietary habits. Managing her weight and dietary intake is crucial to prevent  progression to diabetes. Consumption of high-sugar snacks like Little Debbies is particularly detrimental to her condition. Has not tolerated metformin  well due to GI upset. Concerns for taking while on biktarvy  as well.   Lab Results  Component Value Date   HGBA1C 5.8 (H) 12/04/2023   HGBA1C 5.9 (H) 07/01/2023   HGBA1C 6.1 (H) 02/28/2023    Lab Results  Component Value Date   MICROALBUR 0.7 07/07/2020   LDLCALC 111 (H) 12/04/2023   CREATININE 1.18 (H) 12/04/2023   INSULIN   Date Value Ref Range Status  12/04/2023 19.0 2.6 - 24.9 uIU/mL Final  ]Continue working on nutrition plan to decrease simple carbohydrates, increase lean proteins and exercise to promote weight loss, improve glycemic control and prevent progression to Type 2 diabetes.  - Reinforce the importance of dietary modifications to manage prediabetes, including reducing sugar intake. - Encourage regular physical activity to aid in weight management and improve insulin  sensitivity. Plan to recheck labs over the next 2-3 months.    Vitamin D  Deficiency Vitamin D  is not at goal of 50.  Most recent vitamin D  level was 38.2. She is on  prescription ergocalciferol  50,000 IU weekly. No N/V or muscle weakness with Ergocalciferol .  Lab Results  Component Value Date   VD25OH 38.2 12/04/2023   VD25OH 55.4 07/01/2023   VD25OH 52.5 02/28/2023    Plan: Continue and refill  prescription ergocalciferol  50,000 IU weekly Low vitamin D  levels can be associated with adiposity and may result in leptin resistance and weight gain. Also associated with fatigue.  Currently on vitamin D  supplementation without any adverse effects such as nausea, vomiting or muscle weakness.  Recheck vitamin D  levels in 2-3 months.    Hyperlipidemia LDL is not at goal. Medication(s): Not on Statin therapy.  Cardiovascular risk factors: dyslipidemia, obesity (BMI >= 30 kg/m2), and smoking/ tobacco exposure  Lab Results  Component Value Date   CHOL 201 (H) 12/04/2023   HDL 73 12/04/2023   LDLCALC 111 (H) 12/04/2023   TRIG 95 12/04/2023   CHOLHDL 2.7 12/31/2022   CHOLHDL 2.6 06/20/2022   CHOLHDL 2.9 06/04/2022   Lab Results  Component Value Date   ALT 14 12/04/2023   AST 19 12/04/2023   ALKPHOS 60 12/04/2023   BILITOT 0.2 12/04/2023   The ASCVD Risk score (Arnett DK, et al., 2019)  failed to calculate for the following reasons:   Unable to determine if patient is Non-Hispanic African American  Plan: Continue to work on Engineer, technical sales -decreasing simple carbohydrates, increasing lean proteins, decreasing saturated fats and cholesterol , avoiding trans fats and exercise as able to promote weight loss, improve lipids and decrease cardiovascular risks. Recheck fasting lipids in 2-3 months.     Eating disorder/emotional eating Jessabelle has had issues with stress/emotional eating. Currently this is poorly controlled. Overall mood is stable. Medication(s): Bupropion  XL 300 mg daily  A lapse in bupropion  due to a lack of refills led to increased comfort eating. She has Topamax  available but experiences drowsiness and grogginess with its use, so she is not currently taking it. Plan: Continue and refill Bupropion  XL 300 mg daily - Provide refills for bupropion  to prevent future lapses. - Discuss the side effects of Topamax  and consider its use only if necessary. Hopefully stressors will decrease if only working 2 positions and has more time to focus on health, exercise goals.  Meds ordered this encounter  Medications   DISCONTD: buPROPion  (WELLBUTRIN  XL) 300 MG 24 hr tablet    Sig: Take  1 tablet (300 mg total) by mouth daily.    Dispense:  30 tablet    Refill:  0   Vitamin D , Ergocalciferol , (DRISDOL ) 1.25 MG (50000 UNIT) CAPS capsule    Sig: Take 1 capsule (50,000 Units total) by mouth every 7 (seven) days.    Dispense:  4 capsule    Refill:  0   buPROPion  (WELLBUTRIN  XL) 300 MG 24 hr tablet    Sig: Take 1 tablet (300 mg total) by mouth daily.    Dispense:  30 tablet    Refill:  2    General Health Maintenance She is making lifestyle changes to improve her overall health, including reducing stress by considering a job change and increasing physical activity. She is also focusing on healthier eating habits by avoiding fast food and planning meals at home. - Encourage  continued physical activity, including walking and hiking. 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 - Advise on meal planning and budgeting to maintain healthy eating habits.  Follow-up Her next follow-up appointment is scheduled to monitor her progress and adjust her treatment plan as needed. - Schedule follow-up appointment for August 6th at 10 AM.  Vitals Temp: 98.3 F (36.8 C) BP: 110/74 Pulse Rate: 63 SpO2: 94 %   Anthropometric Measurements Height: 6\' 3"  (1.905 m) Weight: 272 lb (123.4 kg) BMI (Calculated): 34 Weight at Last Visit: 262lb Weight Lost Since Last Visit: 0 Weight Gained Since Last Visit: 10lb Starting Weight: 249 lb Total Weight Loss (lbs): 0 lb (0 kg) Peak Weight: 263lb   Body Composition  Body Fat %: 43.5 % Fat Mass (lbs): 118.4 lbs Muscle Mass (lbs): 146 lbs Total Body Water (lbs): 99.6 lbs Visceral Fat Rating : 10   Other Clinical Data Fasting: no Labs: no Today's Visit #: 23 Starting Date: 06/04/22     ASSESSMENT AND PLAN:  Diet: Maggi is currently in the action stage of change. As such, her goal is to continue with weight loss efforts. She has agreed to keeping a food journal and adhering to recommended goals of 1600 calories and 100+ grams of protein and use [email or web address] for recipe ideas to help stay on track.  Exercise: Makhiya has been instructed to work up to a goal of 150 minutes of combined cardio and strengthening exercise per week and to continue exercising as is for weight loss and overall health benefits.   Behavior Modification:  We discussed the following Behavioral Modification Strategies today: increasing lean protein intake, decreasing simple carbohydrates, increasing vegetables, increase H2O intake, increase high fiber foods, decreasing eating out, no skipping meals, meal planning and cooking strategies, keeping healthy foods in  the home, better snacking choices, emotional eating strategies , avoiding temptations, and planning for success. We discussed various medication options to help January with her weight loss efforts and we both agreed to continue current treatment plan and continue to work on nutritional and behavioral strategies to promote weight loss.   .  Return in about 8 weeks (around 05/11/2024).Aaron Aas She was informed of the importance of frequent follow up visits to maximize her success with intensive lifestyle modifications for her multiple health conditions.  Attestation Statements:   Reviewed by clinician on day of visit: allergies, medications, problem list, medical history, surgical history, family history, social history, and previous encounter notes.   Time spent on visit including pre-visit chart review and post-visit care and charting was 39 minutes.  Daivd Fredericksen, PA-C

## 2024-05-12 ENCOUNTER — Ambulatory Visit (INDEPENDENT_AMBULATORY_CARE_PROVIDER_SITE_OTHER): Payer: Self-pay | Admitting: Physician Assistant

## 2024-05-12 ENCOUNTER — Encounter (INDEPENDENT_AMBULATORY_CARE_PROVIDER_SITE_OTHER): Payer: Self-pay

## 2024-05-12 DIAGNOSIS — F3289 Other specified depressive episodes: Secondary | ICD-10-CM

## 2024-05-12 DIAGNOSIS — E7849 Other hyperlipidemia: Secondary | ICD-10-CM

## 2024-05-12 DIAGNOSIS — R7303 Prediabetes: Secondary | ICD-10-CM

## 2024-05-12 DIAGNOSIS — E669 Obesity, unspecified: Secondary | ICD-10-CM

## 2024-05-12 DIAGNOSIS — E559 Vitamin D deficiency, unspecified: Secondary | ICD-10-CM

## 2024-05-20 LAB — CYTOLOGY - PAP
Adequacy: ABSENT
Diagnosis: NEGATIVE

## 2024-06-09 ENCOUNTER — Ambulatory Visit: Payer: Self-pay

## 2024-06-09 ENCOUNTER — Other Ambulatory Visit: Payer: Self-pay

## 2024-06-09 DIAGNOSIS — Z23 Encounter for immunization: Secondary | ICD-10-CM

## 2024-06-12 ENCOUNTER — Other Ambulatory Visit: Payer: Self-pay | Admitting: Infectious Disease

## 2024-06-12 DIAGNOSIS — B20 Human immunodeficiency virus [HIV] disease: Secondary | ICD-10-CM

## 2024-08-31 NOTE — Progress Notes (Signed)
 10-YEAR ASCVD RISK SCORE: 2.1%  Sex: Female (at birth) Race: African American Values Current Age: 38 Total Cholesterol (mg/dL) 798 HDL Cholesterol (mg/dL) 73 LDL Cholesterol (mg/dL) 888 Systolic Blood Pressure (mm Hg) 110 Diastolic Blood Pressure (mm Hg) 68 Diabetes: No Smoker: Former Treatment for Hypertension: No Aspirin Therapy: No Statin: Yes  Roddrick Sharron, BSN, [facility]

## 2024-11-02 NOTE — Progress Notes (Unsigned)
 "  Subjective:  Chief complaint: follow-up for HIV disease on medications   Patient ID: Renee Malone, adult    DOB: 08/24/1986, 39 y.o.   MRN: [id number]  HPI  Past Medical History:  Diagnosis Date   Anxiety    panic attacks   Elevated serum creatinine 06/10/2019   Heart murmur    HIV infection (HCC)    Lactose intolerance    Low hemoglobin 05/25/2020   Multiple food allergies    Prediabetes 05/25/2020   Routine screening for STI (sexually transmitted infection) 05/06/2024   SOBOE (shortness of breath on exertion)    Weight gain 02/12/2022    Past Surgical History:  Procedure Laterality Date   BREAST ENHANCEMENT SURGERY Bilateral 02/25/2018   Procedure: MAMMOPLASTY AUGMENTATION WITH SILICONE IMPLANTS;  Surgeon: Arelia Filippo, MD;  Location: [facility];  Service: Plastics;  Laterality: Bilateral;   BREAST LUMPECTOMY Right 02/25/2018   Procedure: EXCISION RIGHT BREAST MASS;  Surgeon: Arelia Filippo, MD;  Location: [facility];  Service: Plastics;  Laterality: Right;   SKIN GRAFT      Family History  Problem Relation Age of Onset   Hypertension Mother    Depression Mother    Anxiety disorder Mother    Bipolar disorder Mother    Schizophrenia Mother    Sleep apnea Mother    Drug abuse Mother    Obesity Mother    Hypertension Maternal Grandmother       Social History   Socioeconomic History   Marital status: Married    Spouse name: Not on file   Number of children: Not on file   Years of education: Not on file   Highest education level: Not on file  Occupational History   Occupation: CNA   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    Social Drivers of Health   Tobacco Use: Medium Risk (05/07/2024)   Patient History    Smoking Tobacco Use: Former    Smokeless Tobacco Use: Never    Passive Exposure: Not on Actuary Strain: Not on file  Food Insecurity: Not on file  Transportation Needs: Not on file  Physical Activity: Not on file  Stress: Not on file  Social Connections: Not on file  Depression (PHQ2-9): Low Risk (12/31/2022)   Depression (PHQ2-9)    PHQ-2 Score: 0  Alcohol Screen: Not on file  Housing: Not on file  Utilities: Not on file  Health Literacy: Not on file    Allergies[1]  Current Medications[2]    Review of Systems     Objective:   Physical Exam        Assessment & Plan:       [1]  Allergies Allergen Reactions   Broccoli [Brassica Oleracea] Anaphylaxis  Other Anaphylaxis    Cauliflower  [2]  Current Outpatient Medications:    atorvastatin  (LIPITOR) 20 MG tablet, Take 1 tablet (20 mg total) by mouth daily., Disp: 30 tablet, Rfl: 11   BIKTARVY  50-200-25 MG TABS tablet, TAKE 1 TABLET BY MOUTH DAILY, Disp: 30 tablet, Rfl: 5   buPROPion  (WELLBUTRIN  XL) 300 MG 24 hr tablet, Take 1 tablet (300 mg total) by mouth daily., Disp: 30 tablet, Rfl: 2   estradiol  (ESTRACE ) 2 MG tablet, Take 2 tablets (4 mg total) by mouth daily., Disp: 180 tablet, Rfl: 3   hydrocortisone  1 % ointment, Apply 1 application topically 2 (two) times daily., Disp: 30 g, Rfl: 2   topiramate  (TOPAMAX ) 25 MG tablet, Take 1 tablet (25 mg total) by mouth at bedtime., Disp: 30 tablet, Rfl: 0   Vitamin D , Ergocalciferol , (DRISDOL ) 1.25 MG (50000 UNIT) CAPS capsule, Take 1 capsule (50,000 Units total) by mouth every 7  (seven) days., Disp: 4 capsule, Rfl: 0  "

## 2024-11-03 ENCOUNTER — Ambulatory Visit: Payer: Self-pay | Admitting: Infectious Disease

## 2024-11-03 DIAGNOSIS — B20 Human immunodeficiency virus [HIV] disease: Secondary | ICD-10-CM

## 2024-11-03 DIAGNOSIS — Z113 Encounter for screening for infections with a predominantly sexual mode of transmission: Secondary | ICD-10-CM

## 2024-11-03 DIAGNOSIS — Z6831 Body mass index (BMI) 31.0-31.9, adult: Secondary | ICD-10-CM

## 2024-11-03 DIAGNOSIS — E7849 Other hyperlipidemia: Secondary | ICD-10-CM

## 2024-11-08 NOTE — Progress Notes (Unsigned)
 "  Subjective:  Chief complaint: follow-up for HIV disease on medications   Patient ID: Renee Malone, adult    DOB: [birth year], 39 y.o.   MRN: [id number]  HPI  Past Medical History:  Diagnosis Date   Anxiety    panic attacks   Elevated serum creatinine 06/10/2019   Heart murmur    HIV infection (HCC)    Lactose intolerance    Low hemoglobin 05/25/2020   Multiple food allergies    Prediabetes 05/25/2020   Routine screening for STI (sexually transmitted infection) 05/06/2024   SOBOE (shortness of breath on exertion)    Weight gain 02/12/2022    Past Surgical History:  Procedure Laterality Date   BREAST ENHANCEMENT SURGERY Bilateral 02/25/2018   Procedure: MAMMOPLASTY AUGMENTATION WITH SILICONE IMPLANTS;  Surgeon: Arelia Filippo, MD;  Location: [facility];  Service: Plastics;  Laterality: Bilateral;   BREAST LUMPECTOMY Right 02/25/2018   Procedure: EXCISION RIGHT BREAST MASS;  Surgeon: Arelia Filippo, MD;  Location: [facility];  Service: Plastics;  Laterality: Right;   SKIN GRAFT      Family History  Problem Relation Age of Onset   Hypertension Mother    Depression Mother    Anxiety disorder Mother    Bipolar disorder Mother    Schizophrenia Mother    Sleep apnea Mother    Drug abuse Mother    Obesity Mother    Hypertension Maternal Grandmother       Social History   Socioeconomic History   Marital status: Married    Spouse name: Not on file   Number of children: Not on file   Years of education: Not on file   Highest education level: Not on file  Occupational History   Occupation: CNA   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    Social Drivers of Health   Tobacco Use: Medium Risk (05/07/2024)   Patient History    Smoking Tobacco Use: Former    Smokeless Tobacco Use: Never    Passive Exposure: Not on Actuary Strain: Not on file  Food Insecurity: Not on file  Transportation Needs: Not on file  Physical Activity: Not on file  Stress: Not on file  Social Connections: Not on file  Depression (PHQ2-9): Low Risk (12/31/2022)   Depression (PHQ2-9)    PHQ-2 Score: 0  Alcohol Screen: Not on file  Housing: Not on file  Utilities: Not on file  Health Literacy: Not on file    Allergies[1]  Current Medications[2]   Review of Systems     Objective:   Physical Exam        Assessment & Plan:       [1]  Allergies Allergen Reactions   Broccoli [Brassica Oleracea] Anaphylaxis  Other Anaphylaxis    Cauliflower  [2]  Current Outpatient Medications:    atorvastatin  (LIPITOR) 20 MG tablet, Take 1 tablet (20 mg total) by mouth daily., Disp: 30 tablet, Rfl: 11   BIKTARVY  50-200-25 MG TABS tablet, TAKE 1 TABLET BY MOUTH DAILY, Disp: 30 tablet, Rfl: 5   buPROPion  (WELLBUTRIN  XL) 300 MG 24 hr tablet, Take 1 tablet (300 mg total) by mouth daily., Disp: 30 tablet, Rfl: 2   estradiol  (ESTRACE ) 2 MG tablet, Take 2 tablets (4 mg total) by mouth daily., Disp: 180 tablet, Rfl: 3   hydrocortisone  1 % ointment, Apply 1 application topically 2 (two) times daily., Disp: 30 g, Rfl: 2   topiramate  (TOPAMAX ) 25 MG tablet, Take 1 tablet (25 mg total) by mouth at bedtime., Disp: 30 tablet, Rfl: 0   Vitamin D , Ergocalciferol , (DRISDOL ) 1.25 MG (50000 UNIT) CAPS capsule, Take 1 capsule (50,000 Units total) by mouth every 7  (seven) days., Disp: 4 capsule, Rfl: 0  "

## 2024-11-10 ENCOUNTER — Ambulatory Visit: Admitting: Internal Medicine

## 2024-11-10 ENCOUNTER — Ambulatory Visit: Admitting: Infectious Disease

## 2024-11-10 ENCOUNTER — Encounter: Payer: Self-pay | Admitting: Infectious Disease

## 2024-11-10 ENCOUNTER — Other Ambulatory Visit: Payer: Self-pay

## 2024-11-10 VITALS — BP 115/77 | HR 73 | Temp 98.1°F | Wt 268.0 lb

## 2024-11-10 DIAGNOSIS — Z1231 Encounter for screening mammogram for malignant neoplasm of breast: Secondary | ICD-10-CM | POA: Diagnosis not present

## 2024-11-10 DIAGNOSIS — Z789 Other specified health status: Secondary | ICD-10-CM

## 2024-11-10 DIAGNOSIS — Z79899 Other long term (current) drug therapy: Secondary | ICD-10-CM | POA: Diagnosis not present

## 2024-11-10 DIAGNOSIS — Z23 Encounter for immunization: Secondary | ICD-10-CM

## 2024-11-10 DIAGNOSIS — B20 Human immunodeficiency virus [HIV] disease: Secondary | ICD-10-CM | POA: Diagnosis not present

## 2024-11-10 DIAGNOSIS — Z113 Encounter for screening for infections with a predominantly sexual mode of transmission: Secondary | ICD-10-CM | POA: Diagnosis not present

## 2024-11-10 DIAGNOSIS — E7849 Other hyperlipidemia: Secondary | ICD-10-CM

## 2024-11-10 MED ORDER — ATORVASTATIN CALCIUM 20 MG PO TABS: 20.0000 mg | ORAL_TABLET | Freq: Every day | ORAL | 11 refills | Status: AC

## 2024-11-10 MED ORDER — BIKTARVY 50-200-25 MG PO TABS: 1.0000 | ORAL_TABLET | Freq: Every day | ORAL | 11 refills | Status: AC

## 2024-11-12 LAB — LIPID PANEL
Cholesterol: 122 mg/dL
HDL: 58 mg/dL
LDL Cholesterol (Calc): 50 mg/dL
Non-HDL Cholesterol (Calc): 64 mg/dL
Total CHOL/HDL Ratio: 2.1 (calc)
Triglycerides: 64 mg/dL

## 2024-11-12 LAB — CBC WITH DIFFERENTIAL/PLATELET
Absolute Lymphocytes: 1994 {cells}/uL (ref 850–3900)
Absolute Monocytes: 375 {cells}/uL (ref 200–950)
Basophils Absolute: 28 {cells}/uL (ref 0–200)
Basophils Relative: 0.5 %
Eosinophils Absolute: 151 {cells}/uL (ref 15–500)
Eosinophils Relative: 2.7 %
HCT: 38.6 % (ref 35.9–46.0)
Hemoglobin: 12.9 g/dL (ref 11.7–15.5)
MCH: 30.7 pg (ref 27.0–33.0)
MCHC: 33.4 g/dL (ref 31.6–35.4)
MCV: 91.9 fL (ref 81.4–101.7)
MPV: 10.9 fL (ref 7.5–12.5)
Monocytes Relative: 6.7 %
Neutro Abs: 3052 {cells}/uL (ref 1500–7800)
Neutrophils Relative %: 54.5 %
Platelets: 254 10*3/uL (ref 140–400)
RBC: 4.2 Million/uL (ref 3.80–5.10)
RDW: 12.3 % (ref 11.0–15.0)
Total Lymphocyte: 35.6 %
WBC: 5.6 10*3/uL (ref 3.8–10.8)

## 2024-11-12 LAB — COMPLETE METABOLIC PANEL WITHOUT GFR
AG Ratio: 1.7 (calc) (ref 1.0–2.5)
ALT: 10 U/L (ref 6–29)
AST: 12 U/L (ref 10–30)
Albumin: 4.6 g/dL (ref 3.6–5.1)
Alkaline phosphatase (APISO): 40 U/L (ref 31–125)
BUN/Creatinine Ratio: 11 (calc) (ref 6–22)
BUN: 14 mg/dL (ref 7–25)
CO2: 27 mmol/L (ref 20–32)
Calcium: 9.5 mg/dL (ref 8.6–10.2)
Chloride: 104 mmol/L (ref 98–110)
Creat: 1.24 mg/dL — ABNORMAL HIGH (ref 0.50–0.99)
Globulin: 2.7 g/dL (ref 1.9–3.7)
Glucose, Bld: 91 mg/dL (ref 65–99)
Potassium: 4.1 mmol/L (ref 3.5–5.3)
Sodium: 140 mmol/L (ref 135–146)
Total Bilirubin: 0.3 mg/dL (ref 0.2–1.2)
Total Protein: 7.3 g/dL (ref 6.1–8.1)

## 2024-11-12 LAB — HEPATITIS B SURFACE ANTIBODY, QUANTITATIVE: Hep B S AB Quant (Post): >1000 m[IU]/mL

## 2024-11-12 LAB — HIV-1 RNA QUANT-NO REFLEX-BLD
HIV 1 RNA Quant: <20 {copies}/mL — AB
HIV-1 RNA Quant, Log: <1.3 {Log_copies}/mL — AB

## 2024-11-26 ENCOUNTER — Ambulatory Visit

## 2025-02-08 ENCOUNTER — Ambulatory Visit: Payer: Self-pay | Admitting: Infectious Disease

## 2025-05-07 ENCOUNTER — Ambulatory Visit: Admitting: Internal Medicine
